# Patient Record
Sex: Female | Born: 1935 | Race: White | Hispanic: No | Smoking: Never smoker
Health system: Southern US, Community
[De-identification: ages and names within clinical notes are randomized; demographics above are authoritative.]

## PROBLEM LIST (undated history)

## (undated) DIAGNOSIS — Z5189 Encounter for other specified aftercare: Secondary | ICD-10-CM

## (undated) DIAGNOSIS — R5383 Other fatigue: Secondary | ICD-10-CM

## (undated) DIAGNOSIS — N390 Urinary tract infection, site not specified: Secondary | ICD-10-CM

## (undated) DIAGNOSIS — H9190 Unspecified hearing loss, unspecified ear: Secondary | ICD-10-CM

## (undated) DIAGNOSIS — S6292XA Unspecified fracture of left wrist and hand, initial encounter for closed fracture: Secondary | ICD-10-CM

## (undated) DIAGNOSIS — IMO0002 Reserved for concepts with insufficient information to code with codable children: Secondary | ICD-10-CM

## (undated) DIAGNOSIS — R42 Dizziness and giddiness: Secondary | ICD-10-CM

## (undated) DIAGNOSIS — R51 Headache: Secondary | ICD-10-CM

## (undated) DIAGNOSIS — K8020 Calculus of gallbladder without cholecystitis without obstruction: Secondary | ICD-10-CM

## (undated) DIAGNOSIS — I10 Essential (primary) hypertension: Secondary | ICD-10-CM

## (undated) DIAGNOSIS — M549 Dorsalgia, unspecified: Secondary | ICD-10-CM

## (undated) DIAGNOSIS — I341 Nonrheumatic mitral (valve) prolapse: Secondary | ICD-10-CM

## (undated) DIAGNOSIS — I4891 Unspecified atrial fibrillation: Secondary | ICD-10-CM

## (undated) DIAGNOSIS — I639 Cerebral infarction, unspecified: Secondary | ICD-10-CM

## (undated) DIAGNOSIS — Z973 Presence of spectacles and contact lenses: Secondary | ICD-10-CM

## (undated) DIAGNOSIS — K219 Gastro-esophageal reflux disease without esophagitis: Secondary | ICD-10-CM

## (undated) DIAGNOSIS — M1990 Unspecified osteoarthritis, unspecified site: Secondary | ICD-10-CM

## (undated) DIAGNOSIS — K529 Noninfective gastroenteritis and colitis, unspecified: Secondary | ICD-10-CM

## (undated) DIAGNOSIS — G8929 Other chronic pain: Secondary | ICD-10-CM

## (undated) DIAGNOSIS — Z923 Personal history of irradiation: Secondary | ICD-10-CM

## (undated) DIAGNOSIS — E785 Hyperlipidemia, unspecified: Secondary | ICD-10-CM

## (undated) DIAGNOSIS — D649 Anemia, unspecified: Secondary | ICD-10-CM

## (undated) DIAGNOSIS — K1231 Oral mucositis (ulcerative) due to antineoplastic therapy: Secondary | ICD-10-CM

## (undated) DIAGNOSIS — J189 Pneumonia, unspecified organism: Secondary | ICD-10-CM

## (undated) DIAGNOSIS — I739 Peripheral vascular disease, unspecified: Secondary | ICD-10-CM

## (undated) DIAGNOSIS — IMO0001 Reserved for inherently not codable concepts without codable children: Secondary | ICD-10-CM

## (undated) DIAGNOSIS — C50919 Malignant neoplasm of unspecified site of unspecified female breast: Secondary | ICD-10-CM

## (undated) DIAGNOSIS — C499 Malignant neoplasm of connective and soft tissue, unspecified: Secondary | ICD-10-CM

## (undated) HISTORY — DX: Unspecified atrial fibrillation: I48.91

## (undated) HISTORY — DX: Unspecified osteoarthritis, unspecified site: M19.90

## (undated) HISTORY — DX: Noninfective gastroenteritis and colitis, unspecified: K52.9

## (undated) HISTORY — DX: Essential (primary) hypertension: I10

## (undated) HISTORY — DX: Calculus of gallbladder without cholecystitis without obstruction: K80.20

## (undated) HISTORY — PX: SQUAMOUS CELL CARCINOMA EXCISION: SHX2433

## (undated) HISTORY — DX: Nonrheumatic mitral (valve) prolapse: I34.1

## (undated) HISTORY — DX: Oral mucositis (ulcerative) due to antineoplastic therapy: K12.31

## (undated) HISTORY — DX: Personal history of irradiation: Z92.3

## (undated) HISTORY — PX: EYE SURGERY: SHX253

## (undated) HISTORY — DX: Hyperlipidemia, unspecified: E78.5

## (undated) HISTORY — DX: Other fatigue: R53.83

## (undated) HISTORY — PX: KYPHOSIS SURGERY: SHX114

---

## 1994-04-08 DIAGNOSIS — C50919 Malignant neoplasm of unspecified site of unspecified female breast: Secondary | ICD-10-CM

## 1994-04-08 HISTORY — DX: Malignant neoplasm of unspecified site of unspecified female breast: C50.919

## 1994-04-08 HISTORY — PX: MASTECTOMY: SHX3

## 1997-04-08 HISTORY — PX: ABDOMINAL HYSTERECTOMY: SHX81

## 1997-04-08 HISTORY — PX: BOWEL RESECTION: SHX1257

## 1997-06-14 ENCOUNTER — Inpatient Hospital Stay (HOSPITAL_COMMUNITY): Admission: AD | Admit: 1997-06-14 | Discharge: 1997-07-07 | Payer: Self-pay | Admitting: Obstetrics and Gynecology

## 1998-06-13 ENCOUNTER — Other Ambulatory Visit: Admission: RE | Admit: 1998-06-13 | Discharge: 1998-06-13 | Payer: Self-pay | Admitting: Obstetrics and Gynecology

## 1999-07-12 ENCOUNTER — Emergency Department (HOSPITAL_COMMUNITY): Admission: EM | Admit: 1999-07-12 | Discharge: 1999-07-12 | Payer: Self-pay

## 1999-07-19 ENCOUNTER — Other Ambulatory Visit: Admission: RE | Admit: 1999-07-19 | Discharge: 1999-07-19 | Payer: Self-pay | Admitting: Obstetrics and Gynecology

## 1999-09-20 ENCOUNTER — Encounter: Admission: RE | Admit: 1999-09-20 | Discharge: 1999-09-20 | Payer: Self-pay | Admitting: General Surgery

## 1999-09-20 ENCOUNTER — Encounter: Payer: Self-pay | Admitting: General Surgery

## 1999-11-07 ENCOUNTER — Encounter: Admission: RE | Admit: 1999-11-07 | Discharge: 1999-11-07 | Payer: Self-pay | Admitting: Orthopedic Surgery

## 1999-11-07 ENCOUNTER — Encounter: Payer: Self-pay | Admitting: Orthopedic Surgery

## 1999-11-07 ENCOUNTER — Ambulatory Visit (HOSPITAL_BASED_OUTPATIENT_CLINIC_OR_DEPARTMENT_OTHER): Admission: RE | Admit: 1999-11-07 | Discharge: 1999-11-07 | Payer: Self-pay | Admitting: Orthopedic Surgery

## 1999-11-14 ENCOUNTER — Encounter: Admission: RE | Admit: 1999-11-14 | Discharge: 1999-12-14 | Payer: Self-pay | Admitting: Orthopedic Surgery

## 1999-12-29 ENCOUNTER — Observation Stay (HOSPITAL_COMMUNITY): Admission: EM | Admit: 1999-12-29 | Discharge: 1999-12-30 | Payer: Self-pay | Admitting: Emergency Medicine

## 1999-12-29 ENCOUNTER — Encounter: Payer: Self-pay | Admitting: Family Medicine

## 2000-06-02 ENCOUNTER — Encounter: Payer: Self-pay | Admitting: Family Medicine

## 2000-06-02 ENCOUNTER — Encounter: Admission: RE | Admit: 2000-06-02 | Discharge: 2000-06-02 | Payer: Self-pay | Admitting: Family Medicine

## 2000-08-13 ENCOUNTER — Other Ambulatory Visit: Admission: RE | Admit: 2000-08-13 | Discharge: 2000-08-13 | Payer: Self-pay | Admitting: Obstetrics and Gynecology

## 2000-09-10 ENCOUNTER — Ambulatory Visit (HOSPITAL_BASED_OUTPATIENT_CLINIC_OR_DEPARTMENT_OTHER): Admission: RE | Admit: 2000-09-10 | Discharge: 2000-09-10 | Payer: Self-pay | Admitting: Orthopedic Surgery

## 2000-09-22 ENCOUNTER — Encounter: Admission: RE | Admit: 2000-09-22 | Discharge: 2000-09-22 | Payer: Self-pay | Admitting: General Surgery

## 2000-09-22 ENCOUNTER — Encounter: Payer: Self-pay | Admitting: General Surgery

## 2001-04-23 ENCOUNTER — Encounter: Admission: RE | Admit: 2001-04-23 | Discharge: 2001-04-23 | Payer: Self-pay | Admitting: Family Medicine

## 2001-04-23 ENCOUNTER — Encounter: Payer: Self-pay | Admitting: Family Medicine

## 2001-09-03 ENCOUNTER — Encounter: Payer: Self-pay | Admitting: Obstetrics and Gynecology

## 2001-09-03 ENCOUNTER — Encounter (INDEPENDENT_AMBULATORY_CARE_PROVIDER_SITE_OTHER): Payer: Self-pay | Admitting: *Deleted

## 2001-09-03 ENCOUNTER — Ambulatory Visit (HOSPITAL_COMMUNITY): Admission: RE | Admit: 2001-09-03 | Discharge: 2001-09-03 | Payer: Self-pay | Admitting: Obstetrics and Gynecology

## 2001-09-04 ENCOUNTER — Inpatient Hospital Stay (HOSPITAL_COMMUNITY): Admission: EM | Admit: 2001-09-04 | Discharge: 2001-09-08 | Payer: Self-pay | Admitting: Emergency Medicine

## 2001-09-04 ENCOUNTER — Encounter: Payer: Self-pay | Admitting: Emergency Medicine

## 2001-09-05 ENCOUNTER — Encounter: Payer: Self-pay | Admitting: General Surgery

## 2001-09-07 ENCOUNTER — Encounter: Payer: Self-pay | Admitting: General Surgery

## 2001-09-22 ENCOUNTER — Encounter: Payer: Self-pay | Admitting: Family Medicine

## 2001-09-22 ENCOUNTER — Encounter: Admission: RE | Admit: 2001-09-22 | Discharge: 2001-09-22 | Payer: Self-pay | Admitting: Family Medicine

## 2002-08-10 ENCOUNTER — Other Ambulatory Visit: Admission: RE | Admit: 2002-08-10 | Discharge: 2002-08-10 | Payer: Self-pay | Admitting: Obstetrics and Gynecology

## 2002-09-27 ENCOUNTER — Encounter: Admission: RE | Admit: 2002-09-27 | Discharge: 2002-09-27 | Payer: Self-pay | Admitting: Family Medicine

## 2002-09-27 ENCOUNTER — Encounter: Payer: Self-pay | Admitting: Family Medicine

## 2002-12-31 ENCOUNTER — Encounter: Admission: RE | Admit: 2002-12-31 | Discharge: 2003-03-31 | Payer: Self-pay | Admitting: Orthopedic Surgery

## 2003-04-09 HISTORY — PX: CHOLECYSTECTOMY: SHX55

## 2003-04-20 ENCOUNTER — Ambulatory Visit (HOSPITAL_COMMUNITY): Admission: RE | Admit: 2003-04-20 | Discharge: 2003-04-20 | Payer: Self-pay | Admitting: *Deleted

## 2003-04-20 ENCOUNTER — Encounter (INDEPENDENT_AMBULATORY_CARE_PROVIDER_SITE_OTHER): Payer: Self-pay | Admitting: *Deleted

## 2003-09-28 ENCOUNTER — Encounter: Admission: RE | Admit: 2003-09-28 | Discharge: 2003-09-28 | Payer: Self-pay | Admitting: General Surgery

## 2004-09-27 ENCOUNTER — Inpatient Hospital Stay (HOSPITAL_COMMUNITY): Admission: EM | Admit: 2004-09-27 | Discharge: 2004-09-28 | Payer: Self-pay | Admitting: Emergency Medicine

## 2004-10-04 ENCOUNTER — Encounter: Admission: RE | Admit: 2004-10-04 | Discharge: 2004-10-04 | Payer: Self-pay | Admitting: General Surgery

## 2005-09-12 ENCOUNTER — Emergency Department (HOSPITAL_COMMUNITY): Admission: EM | Admit: 2005-09-12 | Discharge: 2005-09-12 | Payer: Self-pay | Admitting: *Deleted

## 2005-10-11 ENCOUNTER — Encounter: Admission: RE | Admit: 2005-10-11 | Discharge: 2005-10-11 | Payer: Self-pay | Admitting: General Surgery

## 2006-03-19 ENCOUNTER — Inpatient Hospital Stay (HOSPITAL_COMMUNITY): Admission: EM | Admit: 2006-03-19 | Discharge: 2006-03-21 | Payer: Self-pay | Admitting: Emergency Medicine

## 2006-07-18 ENCOUNTER — Inpatient Hospital Stay (HOSPITAL_COMMUNITY): Admission: EM | Admit: 2006-07-18 | Discharge: 2006-07-22 | Payer: Self-pay | Admitting: Emergency Medicine

## 2006-08-13 ENCOUNTER — Ambulatory Visit (HOSPITAL_COMMUNITY): Admission: RE | Admit: 2006-08-13 | Discharge: 2006-08-13 | Payer: Self-pay | Admitting: Interventional Cardiology

## 2006-10-14 ENCOUNTER — Encounter: Admission: RE | Admit: 2006-10-14 | Discharge: 2006-10-14 | Payer: Self-pay | Admitting: Family Medicine

## 2006-10-17 ENCOUNTER — Encounter: Admission: RE | Admit: 2006-10-17 | Discharge: 2006-10-17 | Payer: Self-pay | Admitting: Family Medicine

## 2007-06-30 ENCOUNTER — Encounter: Admission: RE | Admit: 2007-06-30 | Discharge: 2007-06-30 | Payer: Self-pay | Admitting: Family Medicine

## 2007-07-01 ENCOUNTER — Inpatient Hospital Stay (HOSPITAL_COMMUNITY): Admission: AD | Admit: 2007-07-01 | Discharge: 2007-07-02 | Payer: Self-pay | Admitting: Gastroenterology

## 2007-10-05 ENCOUNTER — Encounter: Admission: RE | Admit: 2007-10-05 | Discharge: 2007-10-05 | Payer: Self-pay | Admitting: Family Medicine

## 2007-10-19 ENCOUNTER — Encounter: Admission: RE | Admit: 2007-10-19 | Discharge: 2007-10-19 | Payer: Self-pay | Admitting: Family Medicine

## 2008-01-06 ENCOUNTER — Emergency Department (HOSPITAL_COMMUNITY): Admission: EM | Admit: 2008-01-06 | Discharge: 2008-01-06 | Payer: Self-pay | Admitting: Emergency Medicine

## 2008-06-02 ENCOUNTER — Encounter: Admission: RE | Admit: 2008-06-02 | Discharge: 2008-06-02 | Payer: Self-pay | Admitting: Family Medicine

## 2008-08-04 ENCOUNTER — Encounter (INDEPENDENT_AMBULATORY_CARE_PROVIDER_SITE_OTHER): Payer: Self-pay | Admitting: General Surgery

## 2008-08-04 ENCOUNTER — Ambulatory Visit (HOSPITAL_BASED_OUTPATIENT_CLINIC_OR_DEPARTMENT_OTHER): Admission: RE | Admit: 2008-08-04 | Discharge: 2008-08-04 | Payer: Self-pay | Admitting: General Surgery

## 2008-10-02 ENCOUNTER — Inpatient Hospital Stay (HOSPITAL_COMMUNITY): Admission: EM | Admit: 2008-10-02 | Discharge: 2008-10-05 | Payer: Self-pay | Admitting: Emergency Medicine

## 2008-10-19 ENCOUNTER — Encounter: Admission: RE | Admit: 2008-10-19 | Discharge: 2008-10-19 | Payer: Self-pay | Admitting: Family Medicine

## 2009-01-02 ENCOUNTER — Ambulatory Visit (HOSPITAL_BASED_OUTPATIENT_CLINIC_OR_DEPARTMENT_OTHER): Admission: RE | Admit: 2009-01-02 | Discharge: 2009-01-02 | Payer: Self-pay | Admitting: General Surgery

## 2009-01-02 ENCOUNTER — Encounter (INDEPENDENT_AMBULATORY_CARE_PROVIDER_SITE_OTHER): Payer: Self-pay | Admitting: General Surgery

## 2009-01-20 ENCOUNTER — Ambulatory Visit: Payer: Self-pay | Admitting: Oncology

## 2009-01-26 LAB — CBC WITH DIFFERENTIAL/PLATELET
Basophils Absolute: 0 10*3/uL (ref 0.0–0.1)
Eosinophils Absolute: 0.1 10*3/uL (ref 0.0–0.5)
HGB: 12.9 g/dL (ref 11.6–15.9)
LYMPH%: 15.1 % (ref 14.0–49.7)
MCV: 94.9 fL (ref 79.5–101.0)
MONO%: 6.4 % (ref 0.0–14.0)
NEUT#: 6.4 10*3/uL (ref 1.5–6.5)
Platelets: 257 10*3/uL (ref 145–400)

## 2009-01-26 LAB — COMPREHENSIVE METABOLIC PANEL
Albumin: 4.1 g/dL (ref 3.5–5.2)
Alkaline Phosphatase: 65 U/L (ref 39–117)
BUN: 24 mg/dL — ABNORMAL HIGH (ref 6–23)
CO2: 25 mEq/L (ref 19–32)
Glucose, Bld: 78 mg/dL (ref 70–99)
Potassium: 4.2 mEq/L (ref 3.5–5.3)
Total Bilirubin: 0.4 mg/dL (ref 0.3–1.2)

## 2009-02-02 ENCOUNTER — Encounter: Admission: RE | Admit: 2009-02-02 | Discharge: 2009-02-02 | Payer: Self-pay | Admitting: Oncology

## 2009-02-07 ENCOUNTER — Ambulatory Visit: Admission: RE | Admit: 2009-02-07 | Discharge: 2009-04-05 | Payer: Self-pay | Admitting: Radiation Oncology

## 2009-02-15 ENCOUNTER — Ambulatory Visit (HOSPITAL_COMMUNITY): Admission: RE | Admit: 2009-02-15 | Discharge: 2009-02-15 | Payer: Self-pay | Admitting: Oncology

## 2009-03-08 ENCOUNTER — Inpatient Hospital Stay (HOSPITAL_COMMUNITY): Admission: RE | Admit: 2009-03-08 | Discharge: 2009-03-09 | Payer: Self-pay | Admitting: General Surgery

## 2009-03-09 ENCOUNTER — Ambulatory Visit: Payer: Self-pay | Admitting: Oncology

## 2009-04-19 ENCOUNTER — Ambulatory Visit: Admission: RE | Admit: 2009-04-19 | Discharge: 2009-07-18 | Payer: Self-pay | Admitting: Radiation Oncology

## 2009-05-31 ENCOUNTER — Other Ambulatory Visit: Admission: RE | Admit: 2009-05-31 | Discharge: 2009-05-31 | Payer: Self-pay | Admitting: General Surgery

## 2009-06-14 ENCOUNTER — Ambulatory Visit (HOSPITAL_BASED_OUTPATIENT_CLINIC_OR_DEPARTMENT_OTHER): Admission: RE | Admit: 2009-06-14 | Discharge: 2009-06-14 | Payer: Self-pay | Admitting: General Surgery

## 2009-07-19 ENCOUNTER — Ambulatory Visit: Admission: RE | Admit: 2009-07-19 | Discharge: 2009-08-23 | Payer: Self-pay | Admitting: Radiation Oncology

## 2009-10-24 ENCOUNTER — Encounter: Admission: RE | Admit: 2009-10-24 | Discharge: 2009-10-24 | Payer: Self-pay | Admitting: Family Medicine

## 2009-10-25 ENCOUNTER — Ambulatory Visit: Payer: Self-pay | Admitting: Oncology

## 2009-10-27 LAB — CBC WITH DIFFERENTIAL/PLATELET
Basophils Absolute: 0 10*3/uL (ref 0.0–0.1)
EOS%: 0.6 % (ref 0.0–7.0)
Eosinophils Absolute: 0.1 10*3/uL (ref 0.0–0.5)
HCT: 38.3 % (ref 34.8–46.6)
MCH: 32.9 pg (ref 25.1–34.0)
MCHC: 33.5 g/dL (ref 31.5–36.0)
MCV: 98.3 fL (ref 79.5–101.0)
Platelets: 212 10*3/uL (ref 145–400)
RDW: 14.4 % (ref 11.2–14.5)
lymph#: 0.4 10*3/uL — ABNORMAL LOW (ref 0.9–3.3)

## 2009-11-06 ENCOUNTER — Encounter: Admission: RE | Admit: 2009-11-06 | Discharge: 2009-11-06 | Payer: Self-pay | Admitting: Oncology

## 2010-01-31 ENCOUNTER — Other Ambulatory Visit: Admission: RE | Admit: 2010-01-31 | Discharge: 2010-01-31 | Payer: Self-pay | Admitting: General Surgery

## 2010-02-15 ENCOUNTER — Ambulatory Visit (HOSPITAL_BASED_OUTPATIENT_CLINIC_OR_DEPARTMENT_OTHER): Admission: RE | Admit: 2010-02-15 | Discharge: 2010-02-15 | Payer: Self-pay | Admitting: General Surgery

## 2010-02-19 ENCOUNTER — Ambulatory Visit: Payer: Self-pay | Admitting: Oncology

## 2010-02-21 LAB — CBC WITH DIFFERENTIAL/PLATELET
Basophils Absolute: 0 10*3/uL (ref 0.0–0.1)
Eosinophils Absolute: 0.1 10*3/uL (ref 0.0–0.5)
HGB: 11.1 g/dL — ABNORMAL LOW (ref 11.6–15.9)
MCV: 98.9 fL (ref 79.5–101.0)
MONO#: 0.5 10*3/uL (ref 0.1–0.9)
MONO%: 8.1 % (ref 0.0–14.0)
NEUT#: 4.8 10*3/uL (ref 1.5–6.5)
Platelets: 182 10*3/uL (ref 145–400)
RDW: 14.3 % (ref 11.2–14.5)

## 2010-02-21 LAB — COMPREHENSIVE METABOLIC PANEL
Albumin: 3.8 g/dL (ref 3.5–5.2)
CO2: 24 mEq/L (ref 19–32)
Calcium: 9 mg/dL (ref 8.4–10.5)
Chloride: 106 mEq/L (ref 96–112)
Glucose, Bld: 79 mg/dL (ref 70–99)
Sodium: 141 mEq/L (ref 135–145)
Total Bilirubin: 0.4 mg/dL (ref 0.3–1.2)
Total Protein: 6.1 g/dL (ref 6.0–8.3)

## 2010-03-21 ENCOUNTER — Ambulatory Visit (HOSPITAL_COMMUNITY)
Admission: RE | Admit: 2010-03-21 | Discharge: 2010-03-21 | Payer: Self-pay | Source: Home / Self Care | Attending: Oncology | Admitting: Oncology

## 2010-03-26 ENCOUNTER — Ambulatory Visit
Admission: RE | Admit: 2010-03-26 | Discharge: 2010-05-08 | Payer: Self-pay | Source: Home / Self Care | Attending: Radiation Oncology | Admitting: Radiation Oncology

## 2010-04-02 ENCOUNTER — Inpatient Hospital Stay (HOSPITAL_COMMUNITY)
Admission: EM | Admit: 2010-04-02 | Discharge: 2010-04-06 | Payer: Self-pay | Source: Home / Self Care | Attending: Internal Medicine | Admitting: Internal Medicine

## 2010-04-02 ENCOUNTER — Emergency Department (HOSPITAL_COMMUNITY)
Admission: EM | Admit: 2010-04-02 | Discharge: 2010-04-02 | Disposition: A | Discharge status: Other Acute Inpatient Hospital | Payer: Self-pay | Source: Home / Self Care | Admitting: Family Medicine

## 2010-04-04 ENCOUNTER — Encounter (INDEPENDENT_AMBULATORY_CARE_PROVIDER_SITE_OTHER): Payer: Self-pay | Admitting: Neurology

## 2010-04-29 ENCOUNTER — Encounter: Payer: Self-pay | Admitting: Family Medicine

## 2010-05-04 ENCOUNTER — Ambulatory Visit: Payer: Self-pay | Admitting: Oncology

## 2010-05-09 ENCOUNTER — Ambulatory Visit: Payer: Medicare Other | Attending: Radiation Oncology | Admitting: Radiation Oncology

## 2010-05-09 DIAGNOSIS — C496 Malignant neoplasm of connective and soft tissue of trunk, unspecified: Secondary | ICD-10-CM | POA: Insufficient documentation

## 2010-05-09 DIAGNOSIS — Z510 Encounter for antineoplastic radiation therapy: Secondary | ICD-10-CM | POA: Insufficient documentation

## 2010-05-24 ENCOUNTER — Other Ambulatory Visit: Payer: Self-pay | Admitting: Oncology

## 2010-05-24 ENCOUNTER — Encounter (HOSPITAL_BASED_OUTPATIENT_CLINIC_OR_DEPARTMENT_OTHER): Payer: Medicare Other | Admitting: Oncology

## 2010-05-24 DIAGNOSIS — C496 Malignant neoplasm of connective and soft tissue of trunk, unspecified: Secondary | ICD-10-CM

## 2010-05-24 DIAGNOSIS — C499 Malignant neoplasm of connective and soft tissue, unspecified: Secondary | ICD-10-CM

## 2010-05-24 LAB — CBC WITH DIFFERENTIAL/PLATELET
BASO%: 0.9 % (ref 0.0–2.0)
EOS%: 1 % (ref 0.0–7.0)
HCT: 36.8 % (ref 34.8–46.6)
LYMPH%: 15.1 % (ref 14.0–49.7)
MCH: 31.9 pg (ref 25.1–34.0)
MCHC: 33.3 g/dL (ref 31.5–36.0)
MONO#: 0.4 10*3/uL (ref 0.1–0.9)
NEUT%: 75.1 % (ref 38.4–76.8)
Platelets: 197 10*3/uL (ref 145–400)
RBC: 3.84 10*6/uL (ref 3.70–5.45)
WBC: 4.9 10*3/uL (ref 3.9–10.3)

## 2010-05-24 LAB — COMPREHENSIVE METABOLIC PANEL
ALT: 15 U/L (ref 0–35)
AST: 22 U/L (ref 0–37)
Alkaline Phosphatase: 97 U/L (ref 39–117)
CO2: 24 mEq/L (ref 19–32)
Creatinine, Ser: 0.86 mg/dL (ref 0.40–1.20)
Sodium: 138 mEq/L (ref 135–145)
Total Bilirubin: 0.5 mg/dL (ref 0.3–1.2)
Total Protein: 6.4 g/dL (ref 6.0–8.3)

## 2010-05-25 ENCOUNTER — Other Ambulatory Visit: Payer: Self-pay | Admitting: Oncology

## 2010-05-25 DIAGNOSIS — C8172 Other classical Hodgkin lymphoma, intrathoracic lymph nodes: Secondary | ICD-10-CM

## 2010-05-28 ENCOUNTER — Other Ambulatory Visit: Payer: Self-pay | Admitting: Oncology

## 2010-05-28 DIAGNOSIS — C761 Malignant neoplasm of thorax: Secondary | ICD-10-CM

## 2010-06-05 ENCOUNTER — Ambulatory Visit: Payer: Medicare Other | Attending: General Surgery | Admitting: Physical Therapy

## 2010-06-05 DIAGNOSIS — IMO0001 Reserved for inherently not codable concepts without codable children: Secondary | ICD-10-CM | POA: Insufficient documentation

## 2010-06-05 DIAGNOSIS — R269 Unspecified abnormalities of gait and mobility: Secondary | ICD-10-CM | POA: Insufficient documentation

## 2010-06-06 ENCOUNTER — Other Ambulatory Visit (HOSPITAL_COMMUNITY): Payer: Medicare Other

## 2010-06-08 ENCOUNTER — Encounter (HOSPITAL_COMMUNITY): Payer: Self-pay

## 2010-06-08 ENCOUNTER — Ambulatory Visit (HOSPITAL_COMMUNITY)
Admission: RE | Admit: 2010-06-08 | Discharge: 2010-06-08 | Disposition: A | Discharge status: Home / Self Care | Payer: Medicare Other | Source: Ambulatory Visit | Attending: Oncology | Admitting: Oncology

## 2010-06-08 DIAGNOSIS — Z9010 Acquired absence of unspecified breast and nipple: Secondary | ICD-10-CM | POA: Insufficient documentation

## 2010-06-08 DIAGNOSIS — Z9089 Acquired absence of other organs: Secondary | ICD-10-CM | POA: Insufficient documentation

## 2010-06-08 DIAGNOSIS — K838 Other specified diseases of biliary tract: Secondary | ICD-10-CM | POA: Insufficient documentation

## 2010-06-08 DIAGNOSIS — C8172 Other classical Hodgkin lymphoma, intrathoracic lymph nodes: Secondary | ICD-10-CM

## 2010-06-08 DIAGNOSIS — C50919 Malignant neoplasm of unspecified site of unspecified female breast: Secondary | ICD-10-CM | POA: Insufficient documentation

## 2010-06-08 HISTORY — DX: Malignant neoplasm of connective and soft tissue, unspecified: C49.9

## 2010-06-08 HISTORY — DX: Malignant neoplasm of unspecified site of unspecified female breast: C50.919

## 2010-06-08 MED ORDER — IOHEXOL 300 MG/ML  SOLN
100.0000 mL | Freq: Once | INTRAMUSCULAR | Status: AC | PRN
  Administered 2010-06-08: 100 mL via INTRAVENOUS

## 2010-06-11 ENCOUNTER — Ambulatory Visit: Payer: Medicare Other | Attending: General Surgery | Admitting: Physical Therapy

## 2010-06-11 DIAGNOSIS — R269 Unspecified abnormalities of gait and mobility: Secondary | ICD-10-CM | POA: Insufficient documentation

## 2010-06-11 DIAGNOSIS — IMO0001 Reserved for inherently not codable concepts without codable children: Secondary | ICD-10-CM | POA: Insufficient documentation

## 2010-06-13 ENCOUNTER — Ambulatory Visit (HOSPITAL_COMMUNITY)
Admission: RE | Admit: 2010-06-13 | Discharge: 2010-06-13 | Disposition: A | Discharge status: Home / Self Care | Payer: Medicare Other | Source: Ambulatory Visit | Attending: Oncology | Admitting: Oncology

## 2010-06-13 DIAGNOSIS — I080 Rheumatic disorders of both mitral and aortic valves: Secondary | ICD-10-CM | POA: Insufficient documentation

## 2010-06-13 DIAGNOSIS — Z01818 Encounter for other preprocedural examination: Secondary | ICD-10-CM | POA: Insufficient documentation

## 2010-06-13 DIAGNOSIS — I079 Rheumatic tricuspid valve disease, unspecified: Secondary | ICD-10-CM | POA: Insufficient documentation

## 2010-06-15 ENCOUNTER — Ambulatory Visit: Payer: Medicare Other | Admitting: Physical Therapy

## 2010-06-18 LAB — COMPREHENSIVE METABOLIC PANEL
ALT: 23 U/L (ref 0–35)
AST: 33 U/L (ref 0–37)
Albumin: 3.7 g/dL (ref 3.5–5.2)
CO2: 25 mEq/L (ref 19–32)
Calcium: 9 mg/dL (ref 8.4–10.5)
GFR calc Af Amer: >60 mL/min (ref >60)
Sodium: 139 mEq/L (ref 135–145)
Total Protein: 6.5 g/dL (ref 6.0–8.3)

## 2010-06-18 LAB — BASIC METABOLIC PANEL
BUN: 19 mg/dL (ref 6–23)
Calcium: 8.6 mg/dL (ref 8.4–10.5)
Calcium: 9.1 mg/dL (ref 8.4–10.5)
Chloride: 106 mEq/L (ref 96–112)
Chloride: 106 mEq/L (ref 96–112)
Creatinine, Ser: 0.74 mg/dL (ref 0.4–1.2)
GFR calc Af Amer: >60 mL/min (ref >60)
GFR calc non Af Amer: >60 mL/min (ref >60)
GFR calc non Af Amer: >60 mL/min (ref >60)
Glucose, Bld: 103 mg/dL — ABNORMAL HIGH (ref 70–99)
Potassium: 3.6 mEq/L (ref 3.5–5.1)
Potassium: 4.2 mEq/L (ref 3.5–5.1)
Sodium: 140 mEq/L (ref 135–145)
Sodium: 140 mEq/L (ref 135–145)

## 2010-06-18 LAB — CK ISOENZYMES
CK-MM: 100 % (ref 95–100)
Total CK: 58 U/L (ref 7–177)

## 2010-06-18 LAB — URINALYSIS, ROUTINE W REFLEX MICROSCOPIC
Glucose, UA: NEGATIVE mg/dL
Leukocytes, UA: NEGATIVE
Specific Gravity, Urine: 1.011 (ref 1.005–1.030)
pH: 7.5 (ref 5.0–8.0)

## 2010-06-18 LAB — METHYLMALONIC ACID, SERUM: Methylmalonic Acid, Quantitative: 190 nmol/L (ref 87–318)

## 2010-06-18 LAB — CBC
HCT: 37.7 % (ref 36.0–46.0)
Hemoglobin: 11.9 g/dL — ABNORMAL LOW (ref 12.0–15.0)
Hemoglobin: 12.6 g/dL (ref 12.0–15.0)
MCH: 31.5 pg (ref 26.0–34.0)
MCV: 96.7 fL (ref 78.0–100.0)
MCV: 97.1 fL (ref 78.0–100.0)
Platelets: 224 10*3/uL (ref 150–400)
Platelets: 225 10*3/uL (ref 150–400)
Platelets: 246 10*3/uL (ref 150–400)
RBC: 3.79 MIL/uL — ABNORMAL LOW (ref 3.87–5.11)
RBC: 3.87 MIL/uL (ref 3.87–5.11)
RBC: 4.08 MIL/uL (ref 3.87–5.11)
RDW: 13.8 % (ref 11.5–15.5)
RDW: 14 % (ref 11.5–15.5)
WBC: 5.6 10*3/uL (ref 4.0–10.5)
WBC: 5.6 10*3/uL (ref 4.0–10.5)
WBC: 5.9 10*3/uL (ref 4.0–10.5)

## 2010-06-18 LAB — URINE MICROSCOPIC-ADD ON

## 2010-06-18 LAB — ANA: Anti Nuclear Antibody(ANA): NEGATIVE

## 2010-06-18 LAB — PROTIME-INR
INR: 1.64 — ABNORMAL HIGH (ref 0.00–1.49)
Prothrombin Time: 18.4 seconds — ABNORMAL HIGH (ref 11.6–15.2)
Prothrombin Time: 19.6 seconds — ABNORMAL HIGH (ref 11.6–15.2)

## 2010-06-18 LAB — LIPID PANEL
Cholesterol: 232 mg/dL — ABNORMAL HIGH (ref 0–200)
Triglycerides: 105 mg/dL (ref <150)

## 2010-06-18 LAB — C-REACTIVE PROTEIN: CRP: 0.1 mg/dL — ABNORMAL LOW (ref <0.6)

## 2010-06-18 LAB — SEDIMENTATION RATE: Sed Rate: 12 mm/hr (ref 0–22)

## 2010-06-18 LAB — TSH: TSH: 3.695 u[IU]/mL (ref 0.350–4.500)

## 2010-06-19 ENCOUNTER — Ambulatory Visit: Payer: Medicare Other | Admitting: Physical Therapy

## 2010-06-19 LAB — BASIC METABOLIC PANEL
CO2: 26 mEq/L (ref 19–32)
Chloride: 105 mEq/L (ref 96–112)
GFR calc non Af Amer: >60 mL/min (ref >60)
Glucose, Bld: 83 mg/dL (ref 70–99)
Potassium: 3.9 mEq/L (ref 3.5–5.1)
Sodium: 137 mEq/L (ref 135–145)

## 2010-06-19 LAB — POCT HEMOGLOBIN-HEMACUE
Hemoglobin: 11.5 g/dL — ABNORMAL LOW (ref 12.0–15.0)
Hemoglobin: 8.4 g/dL — ABNORMAL LOW (ref 12.0–15.0)

## 2010-06-19 LAB — APTT: aPTT: 28 seconds (ref 24–37)

## 2010-06-20 ENCOUNTER — Ambulatory Visit (HOSPITAL_COMMUNITY)
Admission: RE | Admit: 2010-06-20 | Discharge: 2010-06-20 | Disposition: A | Discharge status: Home / Self Care | Payer: Medicare Other | Source: Ambulatory Visit | Attending: Oncology | Admitting: Oncology

## 2010-06-20 DIAGNOSIS — M25559 Pain in unspecified hip: Secondary | ICD-10-CM | POA: Insufficient documentation

## 2010-06-20 DIAGNOSIS — C493 Malignant neoplasm of connective and soft tissue of thorax: Secondary | ICD-10-CM | POA: Insufficient documentation

## 2010-06-20 DIAGNOSIS — R5381 Other malaise: Secondary | ICD-10-CM | POA: Insufficient documentation

## 2010-06-20 DIAGNOSIS — R5383 Other fatigue: Secondary | ICD-10-CM | POA: Insufficient documentation

## 2010-06-20 DIAGNOSIS — C761 Malignant neoplasm of thorax: Secondary | ICD-10-CM

## 2010-06-20 DIAGNOSIS — Z01812 Encounter for preprocedural laboratory examination: Secondary | ICD-10-CM | POA: Insufficient documentation

## 2010-06-20 DIAGNOSIS — I4891 Unspecified atrial fibrillation: Secondary | ICD-10-CM | POA: Insufficient documentation

## 2010-06-20 LAB — CBC
MCH: 31 pg (ref 26.0–34.0)
Platelets: 197 10*3/uL (ref 150–400)
RBC: 3.9 MIL/uL (ref 3.87–5.11)
WBC: 5.7 10*3/uL (ref 4.0–10.5)

## 2010-06-22 ENCOUNTER — Encounter: Payer: Medicare Other | Admitting: Physical Therapy

## 2010-06-26 ENCOUNTER — Inpatient Hospital Stay (HOSPITAL_COMMUNITY)
Admission: RE | Admit: 2010-06-26 | Discharge: 2010-07-03 | DRG: 847 | Disposition: A | Discharge status: Home / Self Care | Payer: Medicare Other | Source: Ambulatory Visit | Attending: Oncology | Admitting: Oncology

## 2010-06-26 ENCOUNTER — Encounter: Payer: Medicare Other | Admitting: Physical Therapy

## 2010-06-26 ENCOUNTER — Encounter: Payer: Medicare Other | Admitting: Oncology

## 2010-06-26 DIAGNOSIS — I4891 Unspecified atrial fibrillation: Secondary | ICD-10-CM | POA: Diagnosis present

## 2010-06-26 DIAGNOSIS — G4700 Insomnia, unspecified: Secondary | ICD-10-CM | POA: Diagnosis not present

## 2010-06-26 DIAGNOSIS — M810 Age-related osteoporosis without current pathological fracture: Secondary | ICD-10-CM | POA: Diagnosis present

## 2010-06-26 DIAGNOSIS — C499 Malignant neoplasm of connective and soft tissue, unspecified: Secondary | ICD-10-CM

## 2010-06-26 DIAGNOSIS — Z853 Personal history of malignant neoplasm of breast: Secondary | ICD-10-CM

## 2010-06-26 DIAGNOSIS — C493 Malignant neoplasm of connective and soft tissue of thorax: Secondary | ICD-10-CM | POA: Diagnosis present

## 2010-06-26 DIAGNOSIS — Z8673 Personal history of transient ischemic attack (TIA), and cerebral infarction without residual deficits: Secondary | ICD-10-CM

## 2010-06-26 DIAGNOSIS — H8110 Benign paroxysmal vertigo, unspecified ear: Secondary | ICD-10-CM | POA: Diagnosis present

## 2010-06-26 DIAGNOSIS — R112 Nausea with vomiting, unspecified: Secondary | ICD-10-CM | POA: Diagnosis not present

## 2010-06-26 DIAGNOSIS — T451X5A Adverse effect of antineoplastic and immunosuppressive drugs, initial encounter: Secondary | ICD-10-CM | POA: Diagnosis not present

## 2010-06-26 DIAGNOSIS — R197 Diarrhea, unspecified: Secondary | ICD-10-CM | POA: Diagnosis not present

## 2010-06-26 DIAGNOSIS — E441 Mild protein-calorie malnutrition: Secondary | ICD-10-CM | POA: Diagnosis present

## 2010-06-26 DIAGNOSIS — Z5111 Encounter for antineoplastic chemotherapy: Principal | ICD-10-CM

## 2010-06-26 DIAGNOSIS — I10 Essential (primary) hypertension: Secondary | ICD-10-CM | POA: Diagnosis present

## 2010-06-26 DIAGNOSIS — D630 Anemia in neoplastic disease: Secondary | ICD-10-CM | POA: Diagnosis present

## 2010-06-26 LAB — COMPREHENSIVE METABOLIC PANEL
ALT: 17 U/L (ref 0–35)
AST: 26 U/L (ref 0–37)
Albumin: 3.3 g/dL — ABNORMAL LOW (ref 3.5–5.2)
Alkaline Phosphatase: 78 U/L (ref 39–117)
BUN: 18 mg/dL (ref 6–23)
Chloride: 106 mEq/L (ref 96–112)
GFR calc Af Amer: >60 mL/min (ref >60)
Potassium: 3.8 mEq/L (ref 3.5–5.1)
Total Bilirubin: 0.6 mg/dL (ref 0.3–1.2)

## 2010-06-26 LAB — CBC
MCV: 99.2 fL (ref 78.0–100.0)
Platelets: 196 10*3/uL (ref 150–400)
RBC: 3.58 MIL/uL — ABNORMAL LOW (ref 3.87–5.11)
WBC: 5.2 10*3/uL (ref 4.0–10.5)

## 2010-06-26 LAB — DIFFERENTIAL
Eosinophils Absolute: 0.1 10*3/uL (ref 0.0–0.7)
Lymphs Abs: 0.9 10*3/uL (ref 0.7–4.0)
Neutrophils Relative %: 74 % (ref 43–77)

## 2010-06-27 ENCOUNTER — Ambulatory Visit: Payer: Medicare Other | Admitting: Radiation Oncology

## 2010-06-27 LAB — CBC
MCH: 30.4 pg (ref 26.0–34.0)
MCHC: 30.9 g/dL (ref 30.0–36.0)
Platelets: 191 10*3/uL (ref 150–400)

## 2010-06-27 LAB — COMPREHENSIVE METABOLIC PANEL
AST: 24 U/L (ref 0–37)
Albumin: 3 g/dL — ABNORMAL LOW (ref 3.5–5.2)
Calcium: 8.4 mg/dL (ref 8.4–10.5)
Creatinine, Ser: 0.77 mg/dL (ref 0.4–1.2)
GFR calc Af Amer: >60 mL/min (ref >60)
Total Bilirubin: 0.5 mg/dL (ref 0.3–1.2)

## 2010-06-27 LAB — MAGNESIUM: Magnesium: 2 mg/dL (ref 1.5–2.5)

## 2010-06-28 ENCOUNTER — Encounter: Payer: Medicare Other | Admitting: Physical Therapy

## 2010-06-29 LAB — CBC
Hemoglobin: 10.7 g/dL — ABNORMAL LOW (ref 12.0–15.0)
MCH: 30.7 pg (ref 26.0–34.0)
MCHC: 31.4 g/dL (ref 30.0–36.0)
RDW: 14.4 % (ref 11.5–15.5)

## 2010-06-29 LAB — BASIC METABOLIC PANEL
CO2: 28 mEq/L (ref 19–32)
CO2: 27 mEq/L (ref 19–32)
Chloride: 108 mEq/L (ref 96–112)
Chloride: 103 mEq/L (ref 96–112)
Creatinine, Ser: 0.64 mg/dL (ref 0.4–1.2)
Creatinine, Ser: 0.98 mg/dL (ref 0.4–1.2)
GFR calc Af Amer: >60 mL/min (ref >60)
GFR calc Af Amer: >60 mL/min (ref >60)
Glucose, Bld: 83 mg/dL (ref 70–99)
Glucose, Bld: 88 mg/dL (ref 70–99)

## 2010-06-29 LAB — PROTIME-INR: INR: 1.06 (ref 0.00–1.49)

## 2010-06-29 LAB — APTT: aPTT: 33 seconds (ref 24–37)

## 2010-06-30 DIAGNOSIS — C499 Malignant neoplasm of connective and soft tissue, unspecified: Secondary | ICD-10-CM

## 2010-07-01 DIAGNOSIS — C499 Malignant neoplasm of connective and soft tissue, unspecified: Secondary | ICD-10-CM

## 2010-07-01 LAB — BASIC METABOLIC PANEL
CO2: 28 mEq/L (ref 19–32)
Calcium: 8.4 mg/dL (ref 8.4–10.5)
Creatinine, Ser: 0.62 mg/dL (ref 0.4–1.2)
GFR calc Af Amer: >60 mL/min (ref >60)
GFR calc non Af Amer: >60 mL/min (ref >60)
Sodium: 136 mEq/L (ref 135–145)

## 2010-07-02 LAB — CLOSTRIDIUM DIFFICILE BY PCR: Toxigenic C. Difficile by PCR: NEGATIVE

## 2010-07-02 LAB — MAGNESIUM: Magnesium: 1.9 mg/dL (ref 1.5–2.5)

## 2010-07-02 LAB — BASIC METABOLIC PANEL
Calcium: 8.5 mg/dL (ref 8.4–10.5)
Creatinine, Ser: 0.69 mg/dL (ref 0.4–1.2)
GFR calc Af Amer: >60 mL/min (ref >60)

## 2010-07-03 ENCOUNTER — Ambulatory Visit: Payer: Medicare Other | Admitting: Radiation Oncology

## 2010-07-03 ENCOUNTER — Encounter: Payer: Medicare Other | Admitting: Physical Therapy

## 2010-07-03 DIAGNOSIS — C496 Malignant neoplasm of connective and soft tissue of trunk, unspecified: Secondary | ICD-10-CM

## 2010-07-05 ENCOUNTER — Encounter: Payer: Medicare Other | Admitting: Physical Therapy

## 2010-07-05 NOTE — H&P (Signed)
Veronica Ellison, Veronica Ellison                ACCOUNT NO.:  [id number]  MEDICAL RECORD NO.:  [id number]           PATIENT TYPE:  I  LOCATION:  1311                         FACILITY:  [facility]  PHYSICIAN:  Jethro Bolus, M.D.          DATE OF BIRTH:  1922/02/26  DATE OF ADMISSION:  06/26/2010 DATE OF DISCHARGE:                             HISTORY & PHYSICAL   REASON FOR ADMISSION:  Elective chemotherapy, continuous IV infusion for 7 days.  HISTORY OF PRESENT ILLNESS:  [name] is an 77 year old Caucasian woman with history of recurrent soft tissue sarcoma.  She was first diagnosed on August 04, 2008, and has had multiple recurrence.  The last recurrence, she underwent resection.  She also had adjuvant radiation therapy after the first recurrence.  She at the last recurrence underwent resection on February 16, 2010, and again similar unspecifiable spindle cell sarcoma, intermediate grade, with positive margin.  She underwent adjuvant radiation therapy again second time and has finished a month ago.  She was seen in second opinion visit with Dr. Orvilla Ellison at [facility] sarcoma program who recommended continuous IV chemotherapy for 7 days given her multiple recurrence and now the last time was with positive margins making her at high risk of recurrence.  She returns today with her daughter and a son.  She reported that she has done relatively well.  The patient reports catheter placement last week with some discomfort at Port-A-Cath site; however, no purulent discharge or erythema.  She no longer has any discomfort or pain in the right posterior chest wall site, no recurrent sarcoma.  She still has some dizziness from her vertigo; however, no fall lately.  She denies any headache, visual changes, mucositis, nausea, vomiting, or lymph node swelling.  No cough, hemoptysis, abdominal pain or swelling, melena, hematuria, hematochezia, vaginal bleeding, skin rash, or focal motor weakness.   Again, the patient has chronic bilateral hip pains predated the sarcoma.  She ambulates with a walker; however, she is able to go grocery shopping with her family going to church and driving to church yesterday.  The rest of the 14 point review of systems is negative.  PAST MEDICAL HISTORY: 1. Recurrent soft tissue sarcoma. 2. Paroxysmal atrial fibrillation:  She used to be on Coumadin;     however, this was discontinued in December and she was admitted.     She is under the care of Dr. Eldridge Ellison from [facility]. 3. Benign positional vertigo. 4. History of acute thalamic stroke in December 2011. 5. Hypertension. 6. History of breast carcinoma. 7. Osteopenia. 8. Osteoarthritis.  PAST SURGICAL HISTORY: 1. Multiple right posterior chest wall resection for sarcoma 2. Cataract removal. 3. Breast mastectomy. 4. Left hand fracture repair. 5. Hysterectomy. 6. Oophorectomy for benign ovarian complex cyst. 7. History of cholecystectomy. 8. 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  ALLERGIES: 1. AMOXICILLIN. 2. MORPHINE SULFATE. 3. MEPERIDINE. 4. TETRACYCLINE. 5. SULFA. 6. NITROFURANTOIN.  CURRENT OUTPATIENT MEDICATIONS: 1. Alendronate 70 mg p.o. every week. 2. Amiodarone 200 mg p.o. daily. 3. Aspirin 325 mg p.o. daily.  4. Calcium carbonate 1 tablet p.o. b.i.d. 5. Diltiazem XT 120 mg p.o. daily. 6. Vicodin 5/325 mg half a tablet daily p.r.n. pain. 7. Multivitamin 1 tablet p.o. daily. 8. Omeprazole 20 mg p.o. daily. 9. Zofran p.r.n. vomiting.  SOCIAL HISTORY:  She denies history of smoking, alcohol, or drug use. The patient lives with her husband.  Her children live within half-an- hour distance.  She is retired now.   FAMILY HISTORY:  Father deceased from heart disease as well as her mother.  She had 5 brothers and 1 sister, all of whom have deceased. One brother had metastatic prostate cancer and 3 others had coronary artery disease.   There was no family member with sarcoma.  PHYSICAL EXAM:  VITAL SIGNS:  Temperature 97.6, heart rate 63, respiratory rate 16, blood pressure 146/67, weight of 49 kg, height of 62 inches, O2 sat 96% on room air.  ECOG performance is 1. GENERAL:  This is a thin, pale woman, in no acute distress. HEENT:  There was no scleral icterus.  There was no oropharyngeal lesion. NECK:  Supple and the exam was negative for cervical, supraclavicular, or axillary adenopathy. LUNGS:  Clear bilaterally without wheezing or crackles. CARDIOVASCULAR:  Regular rate and rhythm, S1 and S2 without murmur, rub or gallop. ABDOMEN:  Soft, flat, nontender, nondistended without organomegaly. There was no pedal edema. NEURO:  Nonfocal.  Lab is pending.  ASSESSMENT AND PLAN: 1. Recurrent sarcoma.  Discussed with Veronica Ellison and her family     members that she has had recurrence soft tissue sarcoma and the     last one was with positive margins.  She was evaluated by Dr. Orvilla Ellison who recommended adjuvant chemotherapy to decrease the risk     of recurrence and metastatic disease.  Discussed that in patients     with lower extremity high-grade sarcoma, adjuvant chemotherapy,     Adriamycin, and ifosfamide have shown to have approximately 30%     chance of decrease the risk of recurrence.  However, given her     extreme age, ifosfamide is not recommended.  Multiple trials have     shown continuous infusions of Adriamycin and Cytoxan may be used in     adjuvant sarcoma and have some benefit as well.  I discussed the     side effects of chemotherapy which include but not limited to     alopecia, nausea, vomiting, mucositis, thrombocytopenia, bleeding,     infection secondary to leukemia and lymphoma and recall reactions     from therapy recall and congestive heart failure.  She had an     echocardiogram performed on June 13, 2010, which showed normal EF.     After the informed consent, she wished to proceed with  chemotherapy     to decrease the risk of sarcoma recurrence metastatic disease     despite the possible side effects.  She will get the Emend 150 mg     IV x1, dexamethasone 60 mg IV x1, Aloxi  0.25 mg p.o. x1 10 minutes     before chemo on day #1.  Cytoxan 700 mg/sq m, currently at [place] and     80 mg percutaneous infusion over 7 days, and doxorubicin at 70     mg/sq m over 7 days with dose of 98 mg for 7 days continuous IV     infusion. 2. History of atrial fibrillation.  She is on diltiazem 120 mg p.o.  daily and amiodarone 200 mg p.o. daily per cardiologist.  She is     not on Coumadin, I assume from history of vertigo and concern for     recurrent form of  hemorrhage.  I will defer to her cardiologist to     assess the need for chronic anticoagulation therapy. 3. Osteoporosis, on alendronate 70 mg p.o. every week and calcium     twice a day. 4. History of thalamic stroke.  She is on aspirin per neurologist. 5. Gastroesophageal reflux disease.  She is on omeprazole per PCP.  CODE STATUS:  Full code.  Prophylaxis for DVT, Lovenox 40 mg subcu daily and she is on omeprazole for active ulcer prophylaxis.     Jethro Bolus, M.D.     HH/MEDQ  D:  06/26/2010  T:  06/26/2010  Job:  [id number]  cc:   Corky Crafts, MD Fax: [phone]  Gabrielle Dare. Janee Morn, M.D. [facility] [address] [place], [place] [address]  Maryln Gottron, M.D. Fax: [phone]  Stacie Acres. Cliffton Asters, M.D. Fax: [phone]  Dr. Binnie Kand  Electronically Signed by Jethro Bolus MD on 07/05/2010 12:48:36 PM

## 2010-07-05 NOTE — Discharge Summary (Signed)
Veronica Ellison, FOLZ                ACCOUNT NO.:  [id number]  MEDICAL RECORD NO.:  [id number]           PATIENT TYPE:  I  LOCATION:  1311                         FACILITY:  [facility]  PHYSICIAN:  Jethro Bolus, M.D.          DATE OF BIRTH:  11/06/1921  DATE OF ADMISSION:  06/26/2010 DATE OF DISCHARGE:  07/03/2010                              DISCHARGE SUMMARY   DISCHARGE DIAGNOSIS:  Recurrent soft tissue sarcoma of the right posterior chest wall.  STUDIES:  None obtained.  PROCEDURE:  Continuous intravenous chemotherapy infusion between June 26, 2010, and July 03, 2010.  COMORBID CONDITIONS: 1. Osteoporosis. 2. Paroxysmal atrial fibrillation. 3. History of thalamic stroke. 4. Benign positional vertigo. 5. Hypertension. 6. History of breast cancer. 7. Osteoarthritis.  BRIEF HISTORY OF PRESENT ILLNESS:  Veronica Ellison is an 77 year old woman with good performance status ECOG of 0-1 who has had 3 recurrences of her right chest wall soft tissue sarcoma who had undergone repeated resection and aspiration therapy.  The last resection had positive margins.  She underwent chemo and radiation therapy until February 2012 and was therefore admitted for adjuvant chemotherapy as determined by second opinion, Dr. Orvilla Fus, at [facility].  HOSPITAL COURSE: 1. Recurrent soft tissue sarcoma.  She was started on continuous IV     infusions of Adriamycin and Cytoxan with grade 1-2 nausea and     vomiting.  She had Emend, dexamethasone, and Aloxi before     chemotherapy day #1; however, she still had nausea and vomiting and     therefore dexamethasone 8 mg IV q.12 h. was added on days #5, #6,     and #7 of her chemotherapy with great control of her nausea and     vomiting.  However, she developed mild insomnia.  She did not want     to take a sleeping aid without problem.  She did not have any other     side effects of chemotherapy during this hospital course. 2. Atrial  fibrillation.  She was continued on outpatient diltiazem 120     mg p.o. daily.  However, her heart rate during hospital course was     repeatedly under 60.  Therefore, diltiazem was held.  I advised her     to discuss with her cardiologist, Dr. Eldridge Dace to see whether the     diltiazem should be continued or should be switched to something     else to prevent her from becoming bradycardic. 3. Osteoporosis.  Her Fosamax was held during the hospital course     given the concern for peptic ulcer disease while taking inpatient,     would resume as an outpatient.  DISCHARGE MEDICATIONS: 1. Compazine 10 mg p.o. q.6 h. p.r.n. vomiting. 2. Zofran 4 mg under the tongue every 12 hours p.r.n. nausea and     vomiting. 3. Decadron 4 mg p.o. b.i.d. for 3 days. 4. Amiodarone 200 mg p.o. daily. 5. Aspirin 325 mg p.o. daily. 6. Calcium and vitamin D 1 tablet p.o. b.i.d. 7. Diltiazem 120 mg p.o. daily. 8. Fosamax 70  mg p.o. every week. 9. Multivitamin 1 tablet p.o. daily. 10.Omeprazole 20 mg p.o. q.a.m. 11.Systane eye drops for dry eyes daily p.r.n. 12.Ambien 5-10 mg p.o. at bedtime p.r.n. insomnia.  DISCHARGE DIET:  Regular diet.  DISCHARGE ACTIVITY:  No restrictions.  She can resume her with physical therapy outpatient as needed.  DISCHARGE CONDITION:  Stable.  DISCHARGE FOLLOWUP:  With me in clinic on July 18, 2010.     Jethro Bolus, M.D.     HH/MEDQ  D:  07/03/2010  T:  07/03/2010  Job:  [id number]  Electronically Signed by Jethro Bolus MD on 07/05/2010 12:49:05 PM

## 2010-07-08 ENCOUNTER — Emergency Department (HOSPITAL_COMMUNITY)
Admission: EM | Admit: 2010-07-08 | Discharge: 2010-07-09 | Disposition: A | Discharge status: Home / Self Care | Payer: Medicare Other | Source: Home / Self Care | Attending: Emergency Medicine | Admitting: Emergency Medicine

## 2010-07-08 ENCOUNTER — Emergency Department (HOSPITAL_COMMUNITY): Payer: Medicare Other

## 2010-07-08 DIAGNOSIS — I10 Essential (primary) hypertension: Secondary | ICD-10-CM | POA: Insufficient documentation

## 2010-07-08 DIAGNOSIS — R070 Pain in throat: Secondary | ICD-10-CM | POA: Insufficient documentation

## 2010-07-08 DIAGNOSIS — Z853 Personal history of malignant neoplasm of breast: Secondary | ICD-10-CM | POA: Insufficient documentation

## 2010-07-08 DIAGNOSIS — R509 Fever, unspecified: Secondary | ICD-10-CM | POA: Insufficient documentation

## 2010-07-08 DIAGNOSIS — Z9889 Other specified postprocedural states: Secondary | ICD-10-CM | POA: Insufficient documentation

## 2010-07-08 LAB — BASIC METABOLIC PANEL
GFR calc non Af Amer: >60 mL/min (ref >60)
Potassium: 3.9 mEq/L (ref 3.5–5.1)
Sodium: 130 mEq/L — ABNORMAL LOW (ref 135–145)

## 2010-07-08 LAB — CBC
Platelets: 102 10*3/uL — ABNORMAL LOW (ref 150–400)
RDW: 13.7 % (ref 11.5–15.5)
WBC: 1.2 10*3/uL — CL (ref 4.0–10.5)

## 2010-07-09 ENCOUNTER — Inpatient Hospital Stay (HOSPITAL_COMMUNITY)
Admission: EM | Admit: 2010-07-09 | Discharge: 2010-07-14 | DRG: 809 | Disposition: A | Discharge status: Home Health | Payer: Medicare Other | Attending: Internal Medicine | Admitting: Internal Medicine

## 2010-07-09 DIAGNOSIS — D61810 Antineoplastic chemotherapy induced pancytopenia: Secondary | ICD-10-CM | POA: Diagnosis present

## 2010-07-09 DIAGNOSIS — R079 Chest pain, unspecified: Secondary | ICD-10-CM | POA: Diagnosis not present

## 2010-07-09 DIAGNOSIS — E440 Moderate protein-calorie malnutrition: Secondary | ICD-10-CM | POA: Diagnosis present

## 2010-07-09 DIAGNOSIS — R5081 Fever presenting with conditions classified elsewhere: Secondary | ICD-10-CM | POA: Diagnosis present

## 2010-07-09 DIAGNOSIS — D702 Other drug-induced agranulocytosis: Principal | ICD-10-CM | POA: Diagnosis present

## 2010-07-09 DIAGNOSIS — E876 Hypokalemia: Secondary | ICD-10-CM | POA: Diagnosis present

## 2010-07-09 DIAGNOSIS — T451X5A Adverse effect of antineoplastic and immunosuppressive drugs, initial encounter: Secondary | ICD-10-CM | POA: Diagnosis present

## 2010-07-09 DIAGNOSIS — C493 Malignant neoplasm of connective and soft tissue of thorax: Secondary | ICD-10-CM | POA: Diagnosis present

## 2010-07-09 DIAGNOSIS — I10 Essential (primary) hypertension: Secondary | ICD-10-CM | POA: Diagnosis present

## 2010-07-09 DIAGNOSIS — Z853 Personal history of malignant neoplasm of breast: Secondary | ICD-10-CM

## 2010-07-09 DIAGNOSIS — Z8673 Personal history of transient ischemic attack (TIA), and cerebral infarction without residual deficits: Secondary | ICD-10-CM

## 2010-07-09 DIAGNOSIS — H6120 Impacted cerumen, unspecified ear: Secondary | ICD-10-CM | POA: Diagnosis present

## 2010-07-09 DIAGNOSIS — D709 Neutropenia, unspecified: Secondary | ICD-10-CM

## 2010-07-09 DIAGNOSIS — L299 Pruritus, unspecified: Secondary | ICD-10-CM | POA: Diagnosis present

## 2010-07-09 DIAGNOSIS — M810 Age-related osteoporosis without current pathological fracture: Secondary | ICD-10-CM | POA: Diagnosis present

## 2010-07-09 DIAGNOSIS — I4891 Unspecified atrial fibrillation: Secondary | ICD-10-CM | POA: Diagnosis present

## 2010-07-09 DIAGNOSIS — B370 Candidal stomatitis: Secondary | ICD-10-CM | POA: Diagnosis present

## 2010-07-10 ENCOUNTER — Emergency Department (HOSPITAL_COMMUNITY): Payer: Medicare Other

## 2010-07-10 LAB — URINALYSIS, ROUTINE W REFLEX MICROSCOPIC
Bilirubin Urine: NEGATIVE
Glucose, UA: NEGATIVE mg/dL
Ketones, ur: NEGATIVE mg/dL
Leukocytes, UA: NEGATIVE
Nitrite: NEGATIVE
Protein, ur: NEGATIVE mg/dL
Specific Gravity, Urine: 1.012 (ref 1.005–1.030)
Urobilinogen, UA: 0.2 mg/dL (ref 0.0–1.0)
pH: 6.5 (ref 5.0–8.0)

## 2010-07-10 LAB — CBC
HCT: 28.7 % — ABNORMAL LOW (ref 36.0–46.0)
Hemoglobin: 9.3 g/dL — ABNORMAL LOW (ref 12.0–15.0)
MCH: 31.3 pg (ref 26.0–34.0)
MCHC: 32.4 g/dL (ref 30.0–36.0)
MCV: 96.6 fL (ref 78.0–100.0)
Platelets: 75 K/uL — ABNORMAL LOW (ref 150–400)
RBC: 2.97 MIL/uL — ABNORMAL LOW (ref 3.87–5.11)
RDW: 13.6 % (ref 11.5–15.5)
WBC: 0.7 K/uL — CL (ref 4.0–10.5)

## 2010-07-10 LAB — COMPREHENSIVE METABOLIC PANEL WITH GFR
ALT: 26 U/L (ref 0–35)
AST: 20 U/L (ref 0–37)
Albumin: 2.5 g/dL — ABNORMAL LOW (ref 3.5–5.2)
Alkaline Phosphatase: 71 U/L (ref 39–117)
BUN: 12 mg/dL (ref 6–23)
CO2: 27 meq/L (ref 19–32)
Calcium: 8 mg/dL — ABNORMAL LOW (ref 8.4–10.5)
Chloride: 103 meq/L (ref 96–112)
Creatinine, Ser: 0.71 mg/dL (ref 0.4–1.2)
GFR calc non Af Amer: >60 mL/min
Glucose, Bld: 104 mg/dL — ABNORMAL HIGH (ref 70–99)
Potassium: 3.9 meq/L (ref 3.5–5.1)
Sodium: 134 meq/L — ABNORMAL LOW (ref 135–145)
Total Bilirubin: 0.8 mg/dL (ref 0.3–1.2)
Total Protein: 4.6 g/dL — ABNORMAL LOW (ref 6.0–8.3)

## 2010-07-10 LAB — IRON AND TIBC
Saturation Ratios: 16 % — ABNORMAL LOW (ref 20–55)
TIBC: 189 ug/dL — ABNORMAL LOW (ref 250–470)
UIBC: 159 ug/dL

## 2010-07-10 LAB — DIFFERENTIAL
Band Neutrophils: 0 % (ref 0–10)
Basophils Absolute: 0 K/uL (ref 0.0–0.1)
Basophils Relative: 0 % (ref 0–1)
Eosinophils Absolute: 0 K/uL (ref 0.0–0.7)
Eosinophils Relative: 0 % (ref 0–5)
Lymphocytes Relative: 0 % — ABNORMAL LOW (ref 12–46)
Lymphs Abs: 0 K/uL — ABNORMAL LOW (ref 0.7–4.0)
Monocytes Absolute: 0 K/uL — ABNORMAL LOW (ref 0.1–1.0)
Monocytes Relative: 0 % — ABNORMAL LOW (ref 3–12)
Neutrophils Relative %: 0 % — ABNORMAL LOW (ref 43–77)
nRBC: 0 /100{WBCs}

## 2010-07-10 LAB — COMPREHENSIVE METABOLIC PANEL
BUN: 12 mg/dL (ref 6–23)
CO2: 26 mEq/L (ref 19–32)
Calcium: 7.6 mg/dL — ABNORMAL LOW (ref 8.4–10.5)
GFR calc non Af Amer: >60 mL/min (ref >60)
Glucose, Bld: 92 mg/dL (ref 70–99)
Total Protein: 4.4 g/dL — ABNORMAL LOW (ref 6.0–8.3)

## 2010-07-10 LAB — HEMOCCULT GUIAC POC 1CARD (OFFICE): Fecal Occult Bld: POSITIVE

## 2010-07-10 LAB — TYPE AND SCREEN
ABO/RH(D): O POS
Antibody Screen: NEGATIVE

## 2010-07-10 LAB — CLOSTRIDIUM DIFFICILE BY PCR: Toxigenic C. Difficile by PCR: NEGATIVE

## 2010-07-10 LAB — PROTIME-INR
INR: 0.92 (ref 0.00–1.49)
INR: 0.98 (ref 0.00–1.49)
Prothrombin Time: 12.9 seconds (ref 11.6–15.2)

## 2010-07-10 LAB — URINE MICROSCOPIC-ADD ON

## 2010-07-10 LAB — PHOSPHORUS: Phosphorus: 2.6 mg/dL (ref 2.3–4.6)

## 2010-07-10 LAB — FOLATE: Folate: >20 ng/mL

## 2010-07-10 LAB — MAGNESIUM: Magnesium: 1.8 mg/dL (ref 1.5–2.5)

## 2010-07-11 LAB — URINE CULTURE: Culture: NO GROWTH

## 2010-07-11 LAB — CBC
HCT: 38.5 % (ref 36.0–46.0)
Hemoglobin: 8.7 g/dL — ABNORMAL LOW (ref 12.0–15.0)
Hemoglobin: 13.1 g/dL (ref 12.0–15.0)
Platelets: 71 10*3/uL — ABNORMAL LOW (ref 150–400)
Platelets: 235 10*3/uL (ref 150–400)
RBC: 2.8 MIL/uL — ABNORMAL LOW (ref 3.87–5.11)
WBC: 1.1 10*3/uL — CL (ref 4.0–10.5)
WBC: 8.8 10*3/uL (ref 4.0–10.5)

## 2010-07-11 LAB — BASIC METABOLIC PANEL
BUN: 4 mg/dL — ABNORMAL LOW (ref 6–23)
Calcium: 7.7 mg/dL — ABNORMAL LOW (ref 8.4–10.5)
Calcium: 9.5 mg/dL (ref 8.4–10.5)
Creatinine, Ser: 0.7 mg/dL (ref 0.4–1.2)
GFR calc Af Amer: >60 mL/min (ref >60)
GFR calc Af Amer: >60 mL/min (ref >60)
GFR calc non Af Amer: >60 mL/min (ref >60)
GFR calc non Af Amer: 58 mL/min — ABNORMAL LOW (ref >60)
Glucose, Bld: 112 mg/dL — ABNORMAL HIGH (ref 70–99)
Potassium: 4.5 mEq/L (ref 3.5–5.1)
Sodium: 140 mEq/L (ref 135–145)

## 2010-07-11 LAB — DIFFERENTIAL
Basophils Absolute: 0 10*3/uL (ref 0.0–0.1)
Basophils Relative: 1 % (ref 0–1)
Eosinophils Absolute: 0 10*3/uL (ref 0.0–0.7)
Lymphocytes Relative: 45 % (ref 12–46)
Monocytes Relative: 22 % — ABNORMAL HIGH (ref 3–12)
Neutrophils Relative %: 31 % — ABNORMAL LOW (ref 43–77)

## 2010-07-12 ENCOUNTER — Inpatient Hospital Stay (HOSPITAL_COMMUNITY): Payer: Medicare Other

## 2010-07-12 LAB — DIFFERENTIAL
Basophils Relative: 2 % — ABNORMAL HIGH (ref 0–1)
Eosinophils Relative: 2 % (ref 0–5)
Monocytes Absolute: 0.5 10*3/uL (ref 0.1–1.0)
Monocytes Relative: 16 % — ABNORMAL HIGH (ref 3–12)
Neutrophils Relative %: 46 % (ref 43–77)

## 2010-07-12 LAB — COMPREHENSIVE METABOLIC PANEL
ALT: 20 U/L (ref 0–35)
AST: 12 U/L (ref 0–37)
Alkaline Phosphatase: 78 U/L (ref 39–117)
CO2: 27 mEq/L (ref 19–32)
Calcium: 7.5 mg/dL — ABNORMAL LOW (ref 8.4–10.5)
Chloride: 107 mEq/L (ref 96–112)
GFR calc Af Amer: >60 mL/min (ref >60)
GFR calc non Af Amer: >60 mL/min (ref >60)
Glucose, Bld: 100 mg/dL — ABNORMAL HIGH (ref 70–99)
Potassium: 3.1 mEq/L — ABNORMAL LOW (ref 3.5–5.1)
Sodium: 139 mEq/L (ref 135–145)
Total Bilirubin: 0.6 mg/dL (ref 0.3–1.2)

## 2010-07-12 LAB — CBC
HCT: 26.5 % — ABNORMAL LOW (ref 36.0–46.0)
Hemoglobin: 8.5 g/dL — ABNORMAL LOW (ref 12.0–15.0)
MCHC: 32.1 g/dL (ref 30.0–36.0)
RBC: 2.74 MIL/uL — ABNORMAL LOW (ref 3.87–5.11)

## 2010-07-12 LAB — CARDIAC PANEL(CRET KIN+CKTOT+MB+TROPI): Total CK: 31 U/L (ref 7–177)

## 2010-07-12 LAB — MAGNESIUM: Magnesium: 1.7 mg/dL (ref 1.5–2.5)

## 2010-07-13 LAB — CBC
MCH: 31.5 pg (ref 26.0–34.0)
MCHC: 32.4 g/dL (ref 30.0–36.0)
Platelets: 116 10*3/uL — ABNORMAL LOW (ref 150–400)
RDW: 13.6 % (ref 11.5–15.5)

## 2010-07-13 LAB — CARDIAC PANEL(CRET KIN+CKTOT+MB+TROPI)
Total CK: 29 U/L (ref 7–177)
Troponin I: 0.19 ng/mL — ABNORMAL HIGH (ref 0.00–0.06)

## 2010-07-13 LAB — DIFFERENTIAL
Basophils Absolute: 0 10*3/uL (ref 0.0–0.1)
Eosinophils Absolute: 0 10*3/uL (ref 0.0–0.7)
Lymphocytes Relative: 13 % (ref 12–46)
Lymphs Abs: 1.1 10*3/uL (ref 0.7–4.0)
Monocytes Absolute: 1.3 10*3/uL — ABNORMAL HIGH (ref 0.1–1.0)
Neutro Abs: 5.7 10*3/uL (ref 1.7–7.7)

## 2010-07-13 LAB — COMPREHENSIVE METABOLIC PANEL
Alkaline Phosphatase: 75 U/L (ref 39–117)
BUN: 4 mg/dL — ABNORMAL LOW (ref 6–23)
CO2: 26 mEq/L (ref 19–32)
Chloride: 109 mEq/L (ref 96–112)
Creatinine, Ser: 0.7 mg/dL (ref 0.4–1.2)
GFR calc non Af Amer: >60 mL/min (ref >60)
Potassium: 3.3 mEq/L — ABNORMAL LOW (ref 3.5–5.1)
Total Bilirubin: 0.3 mg/dL (ref 0.3–1.2)

## 2010-07-13 LAB — BASIC METABOLIC PANEL
Calcium: 9 mg/dL (ref 8.4–10.5)
Creatinine, Ser: 0.99 mg/dL (ref 0.4–1.2)
GFR calc Af Amer: >60 mL/min (ref >60)
GFR calc non Af Amer: 53 mL/min — ABNORMAL LOW (ref >60)
Glucose, Bld: 87 mg/dL (ref 70–99)
Sodium: 139 mEq/L (ref 135–145)

## 2010-07-13 LAB — APTT: aPTT: 32 seconds (ref 24–37)

## 2010-07-13 LAB — PROTIME-INR: INR: 1.3 (ref 0.00–1.49)

## 2010-07-14 LAB — CBC
MCV: 97.5 fL (ref 78.0–100.0)
Platelets: 147 10*3/uL — ABNORMAL LOW (ref 150–400)
RBC: 2.78 MIL/uL — ABNORMAL LOW (ref 3.87–5.11)
RDW: 13.7 % (ref 11.5–15.5)
WBC: 10.2 10*3/uL (ref 4.0–10.5)

## 2010-07-14 LAB — COMPREHENSIVE METABOLIC PANEL
ALT: 18 U/L (ref 0–35)
AST: 16 U/L (ref 0–37)
Albumin: 2.3 g/dL — ABNORMAL LOW (ref 3.5–5.2)
Alkaline Phosphatase: 90 U/L (ref 39–117)
Chloride: 107 mEq/L (ref 96–112)
GFR calc Af Amer: >60 mL/min (ref >60)
Potassium: 3.9 mEq/L (ref 3.5–5.1)
Sodium: 140 mEq/L (ref 135–145)
Total Bilirubin: 0.5 mg/dL (ref 0.3–1.2)
Total Protein: 4.4 g/dL — ABNORMAL LOW (ref 6.0–8.3)

## 2010-07-14 LAB — STOOL CULTURE

## 2010-07-14 LAB — DIFFERENTIAL
Basophils Absolute: 0.1 10*3/uL (ref 0.0–0.1)
Basophils Relative: 1 % (ref 0–1)
Eosinophils Absolute: 0 10*3/uL (ref 0.0–0.7)
Lymphocytes Relative: 14 % (ref 12–46)
Monocytes Absolute: 1.6 10*3/uL — ABNORMAL HIGH (ref 0.1–1.0)
Neutrophils Relative %: 69 % (ref 43–77)

## 2010-07-15 NOTE — Discharge Summary (Signed)
Veronica Ellison, TAMM NO.:  [id number]  MEDICAL RECORD NO.:  [id number]           PATIENT TYPE:  I  LOCATION:  1418                         FACILITY:  [facility]  PHYSICIAN:  Talmage Nap, MD  DATE OF BIRTH:  1922/03/10  DATE OF ADMISSION:  07/09/2010 DATE OF DISCHARGE:  07/14/2010                        DISCHARGE SUMMARY - REFERRING   PRIMARY CARE PHYSICIAN:  [name], M.D.  ONCOLOGIST:  Jethro Bolus, M.D.  CONSULTANTS INVOLVED IN THE CASE: 1. Oncology Dr. Jethro Bolus. 2. [facility]. Marland Kitchen  DISCHARGE DIAGNOSES: 1. Neutropenic fever - resolved. 2. Chest pain, resolved, questionable angina.  The patient is to be on     nitroglycerin and Imdur. 3. Pancytopenia, most likely secondary to chemotherapy. 4. History of recurrent right chest wall sarcoma status post     chemotherapy. 5. Oral thrush. 6. Pruritus, etiology unknown, resolving on discharge. 7. Bilateral ear wax - the patient is to undergo ear syringing prior     to discharge. 8. Hypertension. 9. Atrial fibrillation. 10.Electrolyte imbalance, i.e., hypokalemia and hypomagnesemia,     corrected on discharge. 11.Osteoporosis. 12.History of CVA, presently no neuro deficit. 13.History of benign positional vertigo. 14.Osteoarthritis. 15.History of breast CA.  Patient is an 77 year old Caucasian female with history of right chest wall sarcoma as well as breast CA status post chemotherapy, was admitted to the hospital on July 09, 2010,by [facility] hospitalist with complaint of multiple mouth ulcers as well as low-grade fever.  The patient was said to have had a fever of about 102.2 at home.  There is, however, no history of vomiting, no history of chest pain or shortness of breath, no diarrhea or hematochezia and subsequently, the patient was admitted for stabilization.  Her preadmission meds include: 1. Chemotherapy. 2. Systane drops, polyethylene glycol 400, that is 0.4%, propylene     glycol 0.3%  both eyes 1 drop daily p.r.n. 3. Lomotil (Diphenoxylate/Atropine) 1 cap p.o. 4 times p.r.n. 4. Imodium (loperamide) 2 mg 1-2 caps p.o. q.8h. p.r.n. 5. Ondansetron (Zofran) 4 mg 1-2 tablets daily q.12h. p.r.n. 6. Fosamax (alendronate) 70 mg 1 p.o. every week. 7. Decadron (dexamethasone) 4 mg 1 p.o. b.i.d. 8. Compazine (prochlorperazine) 10 mg 1 p.o. q.6h. p.r.n. 9. Ambien (Zolpidem) 5 mg 0.5 to 1 tablet daily at bedtime p.r.n. 10.Aspirin enteric-coated 325 mg 1 p.o. daily. 11.Calcium carbonate/Vitamin D 600 mg/400 mg 1 p.o. b.i.d. 12.Multivitamin 1 p.o. daily. 13.Omeprazole 20 mg 1 cap p.o. 14.Amiodarone 200 mg 0.5 to 1 tablet p.o. q.a.m.  ALLERGIES: 1. Meperidine (Demerol) 2. Sulfa. 3. Tetracycline. 4. Nitrofurantoin. 5. Morphine sulfate. 6. Amoxicillin. 7. Hydrocodone. 8. Adhesive XYAB. 9. Oxycodone.  Food allergies will include nuts and chocolate.  SOCIAL HISTORY:  Negative for alcohol or tobacco use.  FAMILY HISTORY:  [place] Spaniel to be noncontributory.  REVIEW OF SYSTEMS:  Essentially documented in the initial history andphysical.  PHYSICAL EXAMINATION:  VITAL SIGNS:  At time the patient was seen by the admitting physician, vital signs:  Temperature was 100.2, blood pressure was 124/61, pulse 77, respiratory rate 18, saturating 99% on room air. HEENT:  Pupils were reactive to light and extraocular muscles are intact.  The patient was not in any respiratory distress.  She also has dry buccal mucosa and  multiple oral ulcers, i.e. thrush. NECK:  No jugular venous distention.  No carotid bruit.  No lymphadenopathy. CHEST:  Said to be clear to auscultation. HEART:  Heart sounds were S1 and S2. ABDOMEN:  Soft, nontender.  Liver, spleen tip not palpable.  Bowel sounds are positive. EXTREMITIES:  No pedal edema. NEUROLOGIC:  Nonfocal. Musculoskeletal system showed arthritic changes in the knees and in the feet.  Neuropsychiatric evaluation is unremarkable. SKIN:  Showed  decreased turgor and examination of the mouth showed whitish lesion in the mouth.  LAB DATA:  Complete blood count with no differential done on April 1 showed WBC of 1.2, hemoglobin of 10.0, hematocrit of 29.9, MCV of 92.6 with a platelet count of 102,000.  Basic metabolic panel done July 08, 2010 showed sodium of 130, potassium of 3.9, chloride of 99 with a bicarb of 24, glucose is 104, BUN is 22, creatinine 0.78.  Urinalysis unremarkable.  Urine microscopy unremarkable and a comprehensive metabolic panel done on July 10, 2010 showed sodium of 134, potassium of 3.9, chloride of 102 with a bicarb of 27, glucose is 104, BUN is 12, creatinine 0.71 and LFT normal and a repeat complete blood count with no differential done on July 10, 2010 showed WBC of 0.7, hemoglobin 9.3, hematocrit of 28.7, MCV of 96.6 with a platelet count of 75,000. Phosphorus level is 2.6, magnesium level is 1.8.  Fecal occult blood test was positive. C difficile PCR negative.  Thyroid panel, TSH 0.97, is normal.  Anemia panel showed total iron 30, total iron binding capacity is 189, percent desaturation is 16, UIBC 158 and vitamin B12 is 329.  Urine culture, no growth.  Repeat complete blood count with differential done on July 11, 2010 showed WBC of 1.1, hemoglobin of 8.7, hematocrit 27.0, MCV of 96.4 with a platelet count of 71,000, neutrophil 31% and absolute neutrophil count is 0.3 and a repeat basic metabolic panel done in July 11, 2010 showed sodium of 138, potassium of 3.5, chloride of 108, the bicarb of 26, glucose is 112, BUN is 4, creatinine 0.70.  Fecal lactoferrin level was positive, this was done in July 10, 2010. Blood culture x2 negative.  A repeat comprehensive metabolic panel done on July 12, 2010 shows sodium of 139, potassium of 3.1, chloride of 107 with a bicarb of 27, glucose is 100, BUN is 4, creatinine 0.60.  LFT normal.  Total protein 4.4, albumin is 2.2, magnesium level is 1.7. Stool culture,  unremarkable.  Three sets of cardiac markers done from April 5 to April 6 are as follows:  Troponin-I 0.23, CK-MB 3.8, repeat troponin-I 0.19, CK-MB 2.9 and the last set, troponin-I 0.22, CK-MB 3.0. A repeat magnesium level done on July 13, 2010 showed magnesium of 1.5. Comprehensive metabolic panel shows sodium of 140, potassium of 3.3, chloride of 109 with a bicarb of 26, glucose is 98, BUN is 4, creatinine 0.70.  Complete blood count with differential done on April 6 showed WBC of 8.1, hemoglobin 8.1, hematocrit of 25.0, MCV of 97.3 with a platelet count of 116,000 and complete blood count with differential done on July 14, 2010 showed WBC of 10.2, hemoglobin 8.6, hematocrit 27.1, MCV of 97.5 with a platelet count of 147,000, neutrophil count is 69% and absolute neutrophil count is 7.1. Magnesium level is 1.6 and comprehensive metabolic panel done showed sodium of 140, potassium of 3.9, chloride of 107  with bicarb of 28, glucose is 93, BUN is 5, creatinine 0.71.  Imaging studies done include chest x-ray on April 1, which showed no evidence of active cardiopulmonary disease and repeat chest x-ray was on July 10, 2010, which showed no acute cardiopulmonary abnormalities, there is however some slight bronchitic changes and a repeat chest x-ray done July 12, 2010 showed left basilar scar versus atelectasis and small pleural effusion.  HOSPITAL COURSE:  The patient was admitted to telemetry.  She was put on neutropenic precautions and started on normal saline IV to go at rate of 75 mL an hour, aspirin 81 mg p.o. daily, Zofran 4 mg IV q.4h. p.r.n. for nausea and SCDs boots for DVT prophylaxis.  She was also started on nystatin swish and swallow 15 mL t.i.d. and also with Magic Mouthwash 15 mL q.6h. p.r.n. The patient was also given fluconazole 200 mg IV stat and subsequently fluconazole 100 mg IV q.24h.  She was started on cefepime IV as well as vancomycin IV and dosing was done by  pharmacy and GI prophylaxis was with Protonix 40 IV q.24h.  Chemo-oncology was consulted, Dr. Exie Parody of [facility] and the patient was subsequently started on Neupogen 200 mcg subcutaneously daily. The patient complained of anorectal pain and subsequently was placed on Anusol suppository b.i.d. p.r.n. She was seen by me for the very first time in this admission on July 11, 2010 and during this encounter, no major changes were made in the patient's management and the initial treatment that was initiated was basically continued. On July 12, 2010, patient was found to be hypokalemic and was started on KCl 40 mEq p.o. stat and this was to be followed by KCl 20 mEq p.o. b.i.d. Over the night on July 12, 2010, the patient was said to have an unspecified episode of chest pain and subsequent cardiac markers were done, showed elevated troponin I, although CK-MB was normal.  She was given nitropaste 0.5 inch q.6h. p.r.n. for chest pain.  At that time, the patient was reevaluated by me on July 13, 2010. Chest pain had almost completely resolved and during this encounter, nitropaste was discontinued and the patient was placed on nitroglycerin 0.4 mg sublingual p.r.n. for chest pain and since the patient had borderline magnesium level, she was placed on mag oxide 400 mg p.o. b.i.d. and to continue with KCl 20 mEq p.o. b.i.d. [facility] was consulted, evaluated the patient and recommended that patient could be discharged if medically stable. Neupogen was also discontinued by me on July 13, 2010.  So far the patient has remained clinically stable.  She was seen by me today, which is July 14, 2010, complained about bilateral ear pain with no discharges and otoscopic examination showed impacted wax and the plan was for the patient to have bilateral ear syringing before being discharged. Clinically, the patient is stable.  Her vital signs; blood pressure 114/64, temperature is  98.1, pulse 76, respiratory rate 18. Throughout my encounter with the patient, I had an extensive discussion with the entire family which include the patient's daughter,spouse granddaughter and sister, and all verbalized understanding.   Plan is for the patient to be discharged home today after ear syringing. Activity as tolerated.  Cardiac prudent diet.  She was advised to follow up with her oncology schedule and to be seen by her primary care physician in 1-2 weeks.  Medication to be taken at home will include: 1. Diphenhydramine cream topically apply b.i.d. p.r.n. 2.  Imdur 30 mg 1 p.o. daily. 3. Magic Mouthwash 15 mL 3 times a day p.r.n. 4. Magnesium oxide 100 mg one p.o. b.i.d. 5. Nitroglycerin 0.4 mg sublingual p.r.n. every 5 minutes x3 doses for     chest pain. 6. Nystatin suspension 100,000 units/mL by mouth 3 times a day. 7. Ambien (Zolpidem) 5 mg half to 1 tablet p.o. daily p.r.n. at     bedtime. 8. Amiodarone 200 mg half tablet to one by mouth q.a.m. 9. Aspirin enteric-coated 325 mg one p.o. daily. 10.Calcium carbonate/vitamin D 600 mg/400 mg 1 p.o. b.i.d. 11.The patient is to continue chemotherapy regimen. 12.Compazine (prochlorperazine) 10 mg 1 p.o. q.6h. p.r.n. 13.Decadron (dexamethasone) 4 mg 1 p.o. b.i.d. 14.Fosamax (alendronate) 70 mg one p.o. every week. 15.Imodium (loperamide) 2 mg 1-2 capsule by mouth q.8h. p.r.n. 16.Lomotil (diphenoxylate/Atropine) 1 capsule p.o. 4 times p.r.n. for     diarrhea. 17.Multivitamin 1 tablet p.o. daily. 18.Omeprazole 20 mg 1 p.o. q.a.m. 19.Ondansetron (Zofran) 4 mg 1-2 tablets p.o. or sublingual q.12h.     p.r.n. 20.Systane drops, that is polyethylene glycol 400 and 0.5% propylene     glycol 0.3%, 1 drop both eyes daily p.r.n.     Talmage Nap, MD     CN/MEDQ  D:  07/14/2010  T:  07/14/2010  Job:  [id number]  cc:   [name], M.D. Fax: [phone]  Electronically Signed by Talmage Nap  on 07/15/2010 07:56:35  AM

## 2010-07-16 LAB — CBC
HCT: 33.2 % — ABNORMAL LOW (ref 36.0–46.0)
HCT: 36.6 % (ref 36.0–46.0)
Hemoglobin: 10.5 g/dL — ABNORMAL LOW (ref 12.0–15.0)
Hemoglobin: 11 g/dL — ABNORMAL LOW (ref 12.0–15.0)
MCHC: 33.1 g/dL (ref 30.0–36.0)
MCHC: 33.6 g/dL (ref 30.0–36.0)
MCHC: 34.5 g/dL (ref 30.0–36.0)
MCV: 96.6 fL (ref 78.0–100.0)
MCV: 97.6 fL (ref 78.0–100.0)
Platelets: 154 10*3/uL (ref 150–400)
Platelets: 231 10*3/uL (ref 150–400)
RBC: 3.4 MIL/uL — ABNORMAL LOW (ref 3.87–5.11)
RBC: 3.42 MIL/uL — ABNORMAL LOW (ref 3.87–5.11)
RDW: 15.3 % (ref 11.5–15.5)
WBC: 6.4 10*3/uL (ref 4.0–10.5)
WBC: 7.8 10*3/uL (ref 4.0–10.5)

## 2010-07-16 LAB — COMPREHENSIVE METABOLIC PANEL
ALT: 299 U/L — ABNORMAL HIGH (ref 0–35)
AST: 169 U/L — ABNORMAL HIGH (ref 0–37)
AST: 242 U/L — ABNORMAL HIGH (ref 0–37)
Albumin: 2.5 g/dL — ABNORMAL LOW (ref 3.5–5.2)
Albumin: 3.6 g/dL (ref 3.5–5.2)
Alkaline Phosphatase: 188 U/L — ABNORMAL HIGH (ref 39–117)
BUN: 19 mg/dL (ref 6–23)
BUN: 9 mg/dL (ref 6–23)
CO2: 26 mEq/L (ref 19–32)
Calcium: 8.1 mg/dL — ABNORMAL LOW (ref 8.4–10.5)
Calcium: 8.3 mg/dL — ABNORMAL LOW (ref 8.4–10.5)
Calcium: 9.1 mg/dL (ref 8.4–10.5)
Chloride: 106 mEq/L (ref 96–112)
Chloride: 107 mEq/L (ref 96–112)
Creatinine, Ser: 0.85 mg/dL (ref 0.4–1.2)
Creatinine, Ser: 1.03 mg/dL (ref 0.4–1.2)
GFR calc Af Amer: >60 mL/min (ref >60)
GFR calc Af Amer: >60 mL/min (ref >60)
GFR calc Af Amer: >60 mL/min (ref >60)
GFR calc non Af Amer: 51 mL/min — ABNORMAL LOW (ref >60)
GFR calc non Af Amer: >60 mL/min (ref >60)
Glucose, Bld: 103 mg/dL — ABNORMAL HIGH (ref 70–99)
Glucose, Bld: 118 mg/dL — ABNORMAL HIGH (ref 70–99)
Glucose, Bld: 79 mg/dL (ref 70–99)
Potassium: 3.3 mEq/L — ABNORMAL LOW (ref 3.5–5.1)
Sodium: 139 mEq/L (ref 135–145)
Total Bilirubin: 1.3 mg/dL — ABNORMAL HIGH (ref 0.3–1.2)
Total Bilirubin: 1.7 mg/dL — ABNORMAL HIGH (ref 0.3–1.2)
Total Protein: 5.2 g/dL — ABNORMAL LOW (ref 6.0–8.3)

## 2010-07-16 LAB — DIFFERENTIAL
Basophils Absolute: 0 10*3/uL (ref 0.0–0.1)
Eosinophils Absolute: 0 10*3/uL (ref 0.0–0.7)
Eosinophils Relative: 0 % (ref 0–5)
Lymphocytes Relative: 9 % — ABNORMAL LOW (ref 12–46)
Lymphs Abs: 0.6 10*3/uL — ABNORMAL LOW (ref 0.7–4.0)
Lymphs Abs: 1.1 10*3/uL (ref 0.7–4.0)
Monocytes Absolute: 0 10*3/uL — ABNORMAL LOW (ref 0.1–1.0)
Neutro Abs: 5.8 10*3/uL (ref 1.7–7.7)

## 2010-07-16 LAB — POCT CARDIAC MARKERS
CKMB, poc: <1 ng/mL — ABNORMAL LOW (ref 1.0–8.0)
Myoglobin, poc: 43 ng/mL (ref 12–200)
Troponin i, poc: <0.05 ng/mL (ref 0.00–0.09)

## 2010-07-16 LAB — URINE MICROSCOPIC-ADD ON

## 2010-07-16 LAB — CULTURE, BLOOD (ROUTINE X 2)
Culture  Setup Time: 201204030451
Culture: NO GROWTH

## 2010-07-16 LAB — LIPASE, BLOOD: Lipase: 44 U/L (ref 11–59)

## 2010-07-16 LAB — URINALYSIS, ROUTINE W REFLEX MICROSCOPIC
Nitrite: NEGATIVE
Specific Gravity, Urine: 1.019 (ref 1.005–1.030)
Urobilinogen, UA: 2 mg/dL — ABNORMAL HIGH (ref 0.0–1.0)
pH: 7 (ref 5.0–8.0)

## 2010-07-16 LAB — PROTIME-INR: Prothrombin Time: 14.9 seconds (ref 11.6–15.2)

## 2010-07-16 LAB — TYPE AND SCREEN: ABO/RH(D): O POS

## 2010-07-18 ENCOUNTER — Other Ambulatory Visit: Payer: Self-pay | Admitting: Oncology

## 2010-07-18 ENCOUNTER — Encounter (HOSPITAL_BASED_OUTPATIENT_CLINIC_OR_DEPARTMENT_OTHER): Payer: Medicare Other | Admitting: Oncology

## 2010-07-18 DIAGNOSIS — B370 Candidal stomatitis: Secondary | ICD-10-CM

## 2010-07-18 DIAGNOSIS — D61818 Other pancytopenia: Secondary | ICD-10-CM

## 2010-07-18 DIAGNOSIS — C496 Malignant neoplasm of connective and soft tissue of trunk, unspecified: Secondary | ICD-10-CM

## 2010-07-18 LAB — BASIC METABOLIC PANEL
Calcium: 9.1 mg/dL (ref 8.4–10.5)
Creatinine, Ser: 1.1 mg/dL (ref 0.4–1.2)
GFR calc Af Amer: 57 mL/min — ABNORMAL LOW (ref >60)
GFR calc non Af Amer: 47 mL/min — ABNORMAL LOW (ref >60)
Glucose, Bld: 91 mg/dL (ref 70–99)
Sodium: 140 mEq/L (ref 135–145)

## 2010-07-18 LAB — CBC WITH DIFFERENTIAL/PLATELET
Basophils Absolute: 0 10*3/uL (ref 0.0–0.1)
EOS%: 0.5 % (ref 0.0–7.0)
HGB: 10.7 g/dL — ABNORMAL LOW (ref 11.6–15.9)
LYMPH%: 12.4 % — ABNORMAL LOW (ref 14.0–49.7)
MCH: 31.8 pg (ref 25.1–34.0)
MCV: 96.1 fL (ref 79.5–101.0)
MONO%: 0.5 % (ref 0.0–14.0)
Platelets: 290 10*3/uL (ref 145–400)
RDW: 13.5 % (ref 11.2–14.5)

## 2010-07-18 LAB — PROTIME-INR: INR: 1.1 (ref 0.00–1.49)

## 2010-07-18 LAB — COMPREHENSIVE METABOLIC PANEL
AST: 19 U/L (ref 0–37)
Albumin: 3.6 g/dL (ref 3.5–5.2)
Alkaline Phosphatase: 111 U/L (ref 39–117)
BUN: 16 mg/dL (ref 6–23)
Creatinine, Ser: 0.84 mg/dL (ref 0.40–1.20)
Glucose, Bld: 100 mg/dL — ABNORMAL HIGH (ref 70–99)
Potassium: 4.1 mEq/L (ref 3.5–5.3)
Total Bilirubin: 0.3 mg/dL (ref 0.3–1.2)

## 2010-07-18 LAB — APTT: aPTT: 34 seconds (ref 24–37)

## 2010-07-18 NOTE — H&P (Signed)
Veronica Ellison, Veronica Ellison                ACCOUNT NO.:  [id number]  MEDICAL RECORD NO.:  [id number]           PATIENT TYPE:  E  LOCATION:  WLED                         FACILITY:  [facility]  PHYSICIAN:  Michiel Cowboy, MDDATE OF BIRTH:  25-Sep-1921  DATE OF ADMISSION:  07/10/2010 DATE OF DISCHARGE:                             HISTORY & PHYSICAL   ATTENDING PHYSICIAN:  Dr. Sol Passer.  PRIMARY CARE PHYSICIAN:  [name], M.D.  ONCOLOGIST:  Jethro Bolus, M.D.  CHIEF COMPLAINT:  Low-grade fever and painful ulcers in the mouth.  HISTORY OF PRESENT ILLNESS:  The patient is an 77 year old female with history of sarcoma over right chest wall and now also breast cancer. According to him, to going chemotherapy IV, first chemo was on Tuesday, she is not quite sure if she has received Neupogen with her chemotherapy or not.  For the past few days, she had been having pain and mild with swallowing as well as pain that going down her chest and burning with swallowing.  She had a fever up to 102.2 at home, brought her into the emergency department.  The patient was actually admitted for chemotherapy for treatment of her sarcoma, I think she was under impression that they were treated actually her breast cancer, at least therefore she left me to believe in the beginning but after reviewing her further records, it is clear that what was treated is actually her recurrent sarcoma.  She has not had any cough.  She has not had any chest pain per se besides that pain associated with swallowing.  No shortness of breath. No dysuria.  No abdominal pain.  She has had some diarrhea for the past few days.  REVIEW OF SYSTEMS:  Otherwise review of systems negative.  PAST MEDICAL HISTORY:  Significant for 1. Paroxysmal atrial fibrillation, currently only on aspirin,     secondary to recent falls. 2. Osteoporosis. 3. History of thalamic stroke. 4. History of benign positional vertigo. 5. Hypertension. 6. Breast  cancer. 7. Osteoarthritis. 8. 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  Of note, for her sarcoma, she already undergone chemo and radiation therapy until February 2012.  She was admitted for adjuvant chemotherapy.  She received IV Adriamycin and Cytoxan, which she tolerated with some nausea and vomiting.  She was discharged on July 03, 2010.  SOCIAL HISTORY:  The patient does not smoke or drink or abuse drugs.  FAMILY HISTORY:  Noncontributory.  ALLERGIES: 1. AMOXICILLIN causes diarrhea. 2. DEMEROL and HYDROCODONE and MORPHINE cause confusion,     hallucinations. 3. NITROFURANTOIN. 4. OXYCODONE. 5. SULFA causes rash. 6. TETRACYCLINE, she does not know.  MEDICATIONS: 1. Ambien 5 mg as needed at night. 2. Compazine as needed for nausea. 3. Ondansetron 8 mg as needed for nausea. 4. Amiodarone, she takes about transiently 100 mg and 200 mg every     other day. 5. Aspirin 325 mg daily. 6. Calcium carbonate 7. Fosamax 70 weekly. 8. Omeprazole 20 mg daily. 9. Of note, she has had her diltiazem, discontinued.  PHYSICAL EXAMINATION:  VITAL SIGNS:  Temperature 100.2,  blood pressure 125/61, pulse 77, respirations 18, satting 99% room air. GENERAL:  The patient appears to be actually fairly well, in no acute distress. HEENT:  Head nontraumatic.  Dry mucous membranes.  Thrush present. Decreased skin turgor.  LUNGS:  Clear to auscultation bilaterally. HEART:  Regular rate and rhythm, with no murmurs appreciated.  Port in place with no rashes or erythema present.  A site of resection of her right chest wall sarcoma appears to be healing. LOWER EXTREMITIES:  Without clubbing, cyanosis, or edema. NEUROLOGICAL:  Grossly intact. SKIN:  Clean, dry, and intact except for darkening discoloration of her upper or lower extremities.  LABORATORY DATA:  White blood cell count 0.7, neutrophil count unable to calculate because this is low, hemoglobin 9.3  which is down from 11, platelets 75,000 which is down from 103.  Sodium 34, potassium 3.9, creatinine 0.7, albumin 2.5.  UA unremarkable.  Chest x-ray showing bronchitic changes but otherwise nothing specific.  No infiltrate.  No pneumonia.  ASSESSMENT/PLAN:  This is an 77 year old female who was recently undergone Adriamycin and Cytoxan adjuvant chemotherapy for her recurrent soft tissue sarcoma and now apparently she developed neutropenic fever with thrush. 1. Neutropenic fever, will cover broadly for now with cefepime and     vancomycin, give IV fluids.  She is actually appears to be fairly     well.  Will monitor on tele for right now.  Will transfer to step-     down if she wishes to be full code. 2. Thrush with possible candida esophagitis given pain this morning.     We will put her on fluconazole IV for now until she is able to     swallow better, also symptomatic treatment with Magic Mouthwash. 3. History of atrial fibrillation.  Continue aspirin but since her     platelets are down, will just give 81 mg of aspirin and continue     her amiodarone. 4. Prophylaxis, Protonix and SCDs.  Would avoid Lovenox as platelets     starting to go down. 5. Thrombocytopenia likely related to chemotherapy.  We will obtain     type and screen in case if she needs transfusion.  Transfusion     threshold should be around 20 given patient was febrile. 6. History of soft tissue sarcoma followed by Dr. Gaylyn Rong and Dr. Arbutus Ped     was aware of the patient's presentation to the emergency department     and he wishes to try to admit.  The patient followup in the     morning.  Continue management of her neutropenic fever. 7. Code status.  The patient wished to be full code which was     discussed with her family.     Michiel Cowboy, MD     AVD/MEDQ  D:  07/10/2010  T:  07/10/2010  Job:  [id number]  cc:   [name], M.D. Fax: [phone]  Jethro Bolus, M.D.  Electronically Signed by Therisa Doyne MD on 07/18/2010 08:42:55 PM

## 2010-07-26 ENCOUNTER — Other Ambulatory Visit: Payer: Self-pay | Admitting: Oncology

## 2010-07-26 ENCOUNTER — Encounter (HOSPITAL_BASED_OUTPATIENT_CLINIC_OR_DEPARTMENT_OTHER): Payer: Medicare Other | Admitting: Oncology

## 2010-07-26 ENCOUNTER — Inpatient Hospital Stay (HOSPITAL_COMMUNITY)
Admission: AD | Admit: 2010-07-26 | Discharge: 2010-08-02 | DRG: 847 | Disposition: A | Discharge status: Home / Self Care | Payer: Medicare Other | Source: Ambulatory Visit | Attending: Oncology | Admitting: Oncology

## 2010-07-26 DIAGNOSIS — Z5111 Encounter for antineoplastic chemotherapy: Principal | ICD-10-CM

## 2010-07-26 DIAGNOSIS — Z79899 Other long term (current) drug therapy: Secondary | ICD-10-CM

## 2010-07-26 DIAGNOSIS — K589 Irritable bowel syndrome without diarrhea: Secondary | ICD-10-CM | POA: Diagnosis present

## 2010-07-26 DIAGNOSIS — R439 Unspecified disturbances of smell and taste: Secondary | ICD-10-CM | POA: Diagnosis present

## 2010-07-26 DIAGNOSIS — Z9049 Acquired absence of other specified parts of digestive tract: Secondary | ICD-10-CM

## 2010-07-26 DIAGNOSIS — R110 Nausea: Secondary | ICD-10-CM | POA: Diagnosis not present

## 2010-07-26 DIAGNOSIS — R630 Anorexia: Secondary | ICD-10-CM | POA: Diagnosis present

## 2010-07-26 DIAGNOSIS — M810 Age-related osteoporosis without current pathological fracture: Secondary | ICD-10-CM | POA: Diagnosis present

## 2010-07-26 DIAGNOSIS — M1990 Unspecified osteoarthritis, unspecified site: Secondary | ICD-10-CM | POA: Diagnosis present

## 2010-07-26 DIAGNOSIS — N390 Urinary tract infection, site not specified: Secondary | ICD-10-CM | POA: Diagnosis not present

## 2010-07-26 DIAGNOSIS — C499 Malignant neoplasm of connective and soft tissue, unspecified: Secondary | ICD-10-CM

## 2010-07-26 DIAGNOSIS — K219 Gastro-esophageal reflux disease without esophagitis: Secondary | ICD-10-CM | POA: Diagnosis present

## 2010-07-26 DIAGNOSIS — I4891 Unspecified atrial fibrillation: Secondary | ICD-10-CM | POA: Diagnosis present

## 2010-07-26 DIAGNOSIS — H8110 Benign paroxysmal vertigo, unspecified ear: Secondary | ICD-10-CM | POA: Diagnosis present

## 2010-07-26 DIAGNOSIS — C496 Malignant neoplasm of connective and soft tissue of trunk, unspecified: Secondary | ICD-10-CM

## 2010-07-26 DIAGNOSIS — Z9010 Acquired absence of unspecified breast and nipple: Secondary | ICD-10-CM

## 2010-07-26 DIAGNOSIS — B961 Klebsiella pneumoniae [K. pneumoniae] as the cause of diseases classified elsewhere: Secondary | ICD-10-CM | POA: Diagnosis not present

## 2010-07-26 DIAGNOSIS — C493 Malignant neoplasm of connective and soft tissue of thorax: Secondary | ICD-10-CM | POA: Diagnosis present

## 2010-07-26 DIAGNOSIS — I10 Essential (primary) hypertension: Secondary | ICD-10-CM | POA: Diagnosis present

## 2010-07-26 DIAGNOSIS — Z853 Personal history of malignant neoplasm of breast: Secondary | ICD-10-CM

## 2010-07-26 DIAGNOSIS — Z7982 Long term (current) use of aspirin: Secondary | ICD-10-CM

## 2010-07-26 DIAGNOSIS — Z8673 Personal history of transient ischemic attack (TIA), and cerebral infarction without residual deficits: Secondary | ICD-10-CM

## 2010-07-26 LAB — CBC WITH DIFFERENTIAL/PLATELET
BASO%: 0.7 % (ref 0.0–2.0)
EOS%: 3.6 % (ref 0.0–7.0)
HCT: 32.3 % — ABNORMAL LOW (ref 34.8–46.6)
HGB: 10.9 g/dL — ABNORMAL LOW (ref 11.6–15.9)
MCV: 96.4 fL (ref 79.5–101.0)
NEUT#: 2.9 10*3/uL (ref 1.5–6.5)
Platelets: 270 10*3/uL (ref 145–400)
RBC: 3.36 10*6/uL — ABNORMAL LOW (ref 3.70–5.45)
RDW: 15.3 % — ABNORMAL HIGH (ref 11.2–14.5)
WBC: 4.6 10*3/uL (ref 3.9–10.3)
lymph#: 0.9 10*3/uL (ref 0.9–3.3)

## 2010-07-26 LAB — COMPREHENSIVE METABOLIC PANEL
ALT: 14 U/L (ref 0–35)
AST: 20 U/L (ref 0–37)
Calcium: 9.1 mg/dL (ref 8.4–10.5)
Chloride: 104 mEq/L (ref 96–112)
Creatinine, Ser: 0.87 mg/dL (ref 0.40–1.20)
Potassium: 4 mEq/L (ref 3.5–5.3)

## 2010-07-27 LAB — BASIC METABOLIC PANEL
BUN: 13 mg/dL (ref 6–23)
Calcium: 8.6 mg/dL (ref 8.4–10.5)
Creatinine, Ser: 0.75 mg/dL (ref 0.4–1.2)
GFR calc non Af Amer: >60 mL/min (ref >60)
Glucose, Bld: 151 mg/dL — ABNORMAL HIGH (ref 70–99)
Sodium: 139 mEq/L (ref 135–145)

## 2010-07-27 LAB — CBC
HCT: 32.2 % — ABNORMAL LOW (ref 36.0–46.0)
MCHC: 32.6 g/dL (ref 30.0–36.0)
RDW: 15.6 % — ABNORMAL HIGH (ref 11.5–15.5)

## 2010-07-28 DIAGNOSIS — C499 Malignant neoplasm of connective and soft tissue, unspecified: Secondary | ICD-10-CM

## 2010-07-28 LAB — BASIC METABOLIC PANEL
BUN: 17 mg/dL (ref 6–23)
Calcium: 8.2 mg/dL — ABNORMAL LOW (ref 8.4–10.5)
Creatinine, Ser: 0.75 mg/dL (ref 0.4–1.2)
GFR calc non Af Amer: >60 mL/min (ref >60)
Potassium: 3.7 mEq/L (ref 3.5–5.1)

## 2010-07-28 LAB — MAGNESIUM: Magnesium: 2 mg/dL (ref 1.5–2.5)

## 2010-07-29 ENCOUNTER — Inpatient Hospital Stay (HOSPITAL_COMMUNITY): Payer: Medicare Other

## 2010-07-29 LAB — URINALYSIS, ROUTINE W REFLEX MICROSCOPIC
Bilirubin Urine: NEGATIVE
Ketones, ur: NEGATIVE mg/dL
Nitrite: NEGATIVE
Protein, ur: NEGATIVE mg/dL
Urobilinogen, UA: 0.2 mg/dL (ref 0.0–1.0)

## 2010-07-30 DIAGNOSIS — C499 Malignant neoplasm of connective and soft tissue, unspecified: Secondary | ICD-10-CM

## 2010-07-30 LAB — BASIC METABOLIC PANEL
CO2: 26 mEq/L (ref 19–32)
Calcium: 8.3 mg/dL — ABNORMAL LOW (ref 8.4–10.5)
Chloride: 107 mEq/L (ref 96–112)
Creatinine, Ser: 0.67 mg/dL (ref 0.4–1.2)
Glucose, Bld: 120 mg/dL — ABNORMAL HIGH (ref 70–99)

## 2010-07-30 LAB — CBC
HCT: 31.2 % — ABNORMAL LOW (ref 36.0–46.0)
Hemoglobin: 10.1 g/dL — ABNORMAL LOW (ref 12.0–15.0)
MCH: 31.1 pg (ref 26.0–34.0)
MCHC: 32.4 g/dL (ref 30.0–36.0)
RBC: 3.25 MIL/uL — ABNORMAL LOW (ref 3.87–5.11)

## 2010-07-31 LAB — BASIC METABOLIC PANEL
BUN: 20 mg/dL (ref 6–23)
Calcium: 8.5 mg/dL (ref 8.4–10.5)
GFR calc non Af Amer: >60 mL/min (ref >60)
Potassium: 3.6 mEq/L (ref 3.5–5.1)

## 2010-07-31 LAB — URINE CULTURE: Culture  Setup Time: 201204221353

## 2010-07-31 LAB — MAGNESIUM: Magnesium: 1.8 mg/dL (ref 1.5–2.5)

## 2010-08-01 LAB — BASIC METABOLIC PANEL
Calcium: 8 mg/dL — ABNORMAL LOW (ref 8.4–10.5)
GFR calc Af Amer: >60 mL/min (ref >60)
GFR calc non Af Amer: >60 mL/min (ref >60)
Potassium: 3.4 mEq/L — ABNORMAL LOW (ref 3.5–5.1)
Sodium: 136 mEq/L (ref 135–145)

## 2010-08-01 LAB — CBC
HCT: 30.9 % — ABNORMAL LOW (ref 36.0–46.0)
Hemoglobin: 10.3 g/dL — ABNORMAL LOW (ref 12.0–15.0)
MCHC: 33.3 g/dL (ref 30.0–36.0)
RDW: 15.6 % — ABNORMAL HIGH (ref 11.5–15.5)
WBC: 5.7 10*3/uL (ref 4.0–10.5)

## 2010-08-01 LAB — MAGNESIUM: Magnesium: 1.7 mg/dL (ref 1.5–2.5)

## 2010-08-02 LAB — BASIC METABOLIC PANEL
Calcium: 8.1 mg/dL — ABNORMAL LOW (ref 8.4–10.5)
GFR calc Af Amer: >60 mL/min (ref >60)
GFR calc non Af Amer: >60 mL/min (ref >60)
Potassium: 3.6 mEq/L (ref 3.5–5.1)
Sodium: 139 mEq/L (ref 135–145)

## 2010-08-04 ENCOUNTER — Encounter (HOSPITAL_BASED_OUTPATIENT_CLINIC_OR_DEPARTMENT_OTHER): Payer: Medicare Other | Admitting: Oncology

## 2010-08-04 DIAGNOSIS — Z5189 Encounter for other specified aftercare: Secondary | ICD-10-CM

## 2010-08-04 DIAGNOSIS — C496 Malignant neoplasm of connective and soft tissue of trunk, unspecified: Secondary | ICD-10-CM

## 2010-08-07 NOTE — Discharge Summary (Signed)
Veronica Ellison, NO                ACCOUNT NO.:  [id number]  MEDICAL RECORD NO.:  [id number]           PATIENT TYPE:  I  LOCATION:  1317                         FACILITY:  [facility]  PHYSICIAN:  Exie Parody, M.D.        DATE OF BIRTH:  07-11-1921  DATE OF ADMISSION:  07/26/2010 DATE OF DISCHARGE:  08/02/2010                              DISCHARGE SUMMARY   DISCHARGE DIAGNOSES: 1. 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 2. Urinary tract infection. 3. Hypertension. 4. History of paroxysmal atrial fibrillation. 5. History of thalamic cerebral vascular accident. 6. History of irritable bowel syndrome. 7. Osteoporosis. 8. Osteoarthritis.  STUDIES:  Chest x-ray performed on July 29, 2010, for palpitation which showed stable mild cardiomegaly, mild changes in COPD, and chronic bronchitis.  She has minimal small miniscule bilateral pleural effusions and mild left basilar scarring without acute changes.  BRIEF HISTORY OF PRESENT ILLNESS:  Veronica Ellison is an 77 year old woman with history of multiple recurrences of soft tissue sarcoma of the right posterior chest wall.  She underwent multiple resections and adjuvant radiation therapy, the last resection was with positive margins. Therefore, she had second opinion evaluation by Dr. Orvilla Fus from [facility], who recommended the patient receive adjuvant chemotherapy to decrease the risk of recurrence.  She received the first cycle a month ago and was admitted for this admission for the second cycle.  HOSPITAL COURSE: 1. Recurrence soft tissue sarcoma:  Ms. Empie was started on     continuous intravenous infusions of Cytoxan and Adriamycin.  The     dose of Cytoxan was 700 mg/sq m over the course of 7 days and     doxorubicin was 70 mg/sq m over the course of 7 days continuous IV     infusion.  She received premedications with Emend, dexamethasone,     Aloxi prior to starting chemo on  day 1 and received dexamethasone 4 mg     p.o. b.i.d. day 0 to 7 for delay emesis.  She had some nauseous     sensation during the course of chemotherapy, however, did not have     problem with frank vomiting.  She did not have any other side     effects from chemotherapy during this hospital course. 2. Hypertension:  She had history of chest pain and was given Imdur     and on diltiazem for history of paroxysmal AFib.  Both these     medications were discontinued by her cardiologist given that she no     longer has recurrent chest pain and she was becoming bradycardic.     With the use of dexamethasone for delayed emesis from chemotherapy,     she had hypertensive episode with systolic blood pressure up to     high in 170s.  Therefore, IV fluid discontinued on day #6 of     chemotherapy and she was started on amlodipine 5 mg p.o. daily.     The patient did not have any problem with hypertensive crisis with     the headache, visual  changes, chest pain or hematuria, shortness of     breath during this hospital course. 3. History of oral thrush.  After the first cycle of chemotherapy, she     was placed on clotrimazole troches to prevent this problem. 4. History of paroxysmal atrial fibrillation.  She is on amiodarone as     an outpatient. 5. History of osteoporosis.  Her amiodarone was held while she was in     the hospital to decrease the risk of peptic ulcer disease. 6. Urinary tract infection.  On July 29, 2010, she developed frequent     urinations at night most likely secondary to use of IV fluid;     however, UA was negative, however, urine culture showed     Klebsiella sensitive to Levaquin and therefore she was placed on     Levaquin.  She has allergies to sulfa, therefore, Bactrim was not     used.  DISCHARGE EXAMINATION:  VITAL SIGNS:  Temperature 98.4, heart rate 61, respiratory rate 20, blood pressure 168/70, and O2 sat 95% on room air. GENERAL:  Well-nourished,  thin-appearing woman, in no acute distress. There was no scleral icterus.  There was no oropharyngeal lesions. NECK:  Supple. LYMPHATICS:  Lymph exam was negative for cervical, supraclavicular, axillary adenopathy. LUNGS:  Her lungs were clear bilaterally without wheezing or crackles. CARDIAC:  Regular rate and rhythm.  S1, S2 without murmur, rub or gallop. ABDOMEN:  Soft, flat, nontender, nondistended without organomegaly. There was no pedal edema. SKIN:  Exam show ecchymosis and bruises in the lower extremities and extensive surface of her hands without any active bleeding.  Her Port-A- Cath was clean, dry, and intact without any pain.  LABORATORY:  Creatinine 0.58.  DISCHARGE CONDITION:  Stable.  DISCHARGE ACTIVITY:  Increase activity slowly as tolerated.  DISCHARGE DIET:  Regular diet.  DISCHARGE FOLLOW UP:  [facility] on 08[facility]08/10.    DISCHARGE MEDICATIONS: 1. Amlodipine 5 mg p.o. daily. 2. Mycelex troche 10 mg 3 times a day. 3. Dexamethasone 4 mg p.o. b.i.d. for 4 days for delayed nausea/vomiting. 4. Levaquin 500 mg p.o. daily for 2 days. 5. Ambien 5 mg p.o. q.h.s. p.r.n. insomnia. 6. Amiodarone 200 mg PO daily. 7. Ativan 1 mg PO every other day prn. 8. Aspirin 325 mg p.o. daily. 9. Calcium carbonate with vitamin D 600/400 mg b.i.d. 10.Claritin 10 mg p.o. daily p.r.n. 11.Compazine 10 mg p.o. q.6 h. P.r.n. nausea and vomiting. 12.Fosamax 70 mg p.o. every week. 13.Imodium 2 mg p.o. every 8 hours p.r.n. diarrhea. 14.Lomotil 1 capsule 4 times daily p.r.n. diarrhea. 15.Magnesium oxide 400 mg p.o. b.i.d. 16.Multivitamin tab p.o. daily. 17.Nitroglycerin p.r.n. chest pain. 18.Omeprazole 20 mg p.o. q.a.m. 19.Zofran 4 mg p.o. sublingual b.i.d. p.r.n. nausea and vomiting. 20.Percocet 1 tablet of 5/325 mg q.6 h. P.r.n. nausea and vomiting. 21.Systane eyedrop in both eyes twice daily p.r.n.     Exie Parody, M.D.     HTH/MEDQ  D:  08/02/2010  T:  08/02/2010  Job:   [id number]  Electronically Signed by Jethro Bolus MD on 08/07/2010 02:05:10 PM

## 2010-08-07 NOTE — H&P (Signed)
[name], [name]                ACCOUNT NO.:  [id number]  MEDICAL RECORD NO.:  [id number]           PATIENT TYPE:  I  LOCATION:  1317                         FACILITY:  [facility]  PHYSICIAN:  Jethro Bolus, M.D.          DATE OF BIRTH:  [birth year]  DATE OF ADMISSION:  07/26/2010 DATE OF DISCHARGE:                             HISTORY & PHYSICAL   REASON FOR ADMISSION:  Elective chemotherapy, continuous IV infusion for 7 days for recurrent sarcoma of the right posterior chest wall.  HISTORY OF PRESENT ILLNESS:  [name] is an 77 year old woman with history of recurrence soft tissue sarcoma.  She was first diagnosed on August 04, 2008, and has had multiple recurrences.  The last recurrence, she underwent resection with positive margin.  She also had adjuvant radiation therapy after multiple recurrences.  With the last recurrence, she underwent a second opinion with Dr. Orvilla Fus at [facility] sarcoma program who recommended continuous IV chemotherapy 7 days given her multiple recurrences.  She received the first cycle of adjuvant chemotherapy little more than 4 weeks ago and is here for the second cycle.  She is here with her daughter.  She reports that she feels relatively well.  Over the last few days, she has bounced back completely 100% compared to prior to chemotherapy.  She still has abnormal taste and decreased appetite.  She has some intermittent headache.  She denied nausea, vomiting, mucositis, dysphagia, odynophagia, abdominal pain, swelling, diarrhea, constipation.  She has intermittent irritable bowel syndrome with intermittent diarrhea and constipation, however, none in the last few days.  She denies hematochezia, melena, hematuria, vaginal bleeding.  She has chemosis in bilateral extensor surface of forehands; however, no easy bruisability anywhere else.  The rest of the 14-point review of system was negative.  PAST MEDICAL HISTORY: 1. Recurrent soft tissue  sarcoma. 2. Paroxysmal atrial fibrillation.  She has been on Coumadin; however,     this was discontinued.  She is also off diltiazem given the fact     that she had bradycardia at last hospital course. 3. Benign positional vertigo. 4. History of acute thalamic stroke in December of 2011. 5. Hypertension. 6. History of breast carcinoma. 7. Osteopenia. 8. Osteoarthritis.  PAST SURGICAL HISTORY: 1. Multiple right posterior chest wall resections for sarcoma. 2. Cataract removal. 3. Breast mastectomy. 4. Left hand fracture repair. 5. Hysterectomy. 6. Oophorectomy from benign ovarian complex cyst. 7. History of cholecystectomy. 8. 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  ALLERGIES: 1. AMOXICILLIN 2. MORPHINE SULFATE. 3. MEPERIDINE. 4. TETRACYCLINE. 5. SULFA. 6. NITROFURANTOIN.  CURRENT MEDICATIONS:  Current outpatient medications: 1. Amiodarone 200 mg alternating 1/2 tablet and 1 tablet every other     day. 2. Imdur 15 mg p.o. daily. 3. Magnesium oxide 400 mg p.o. b.i.d. p.r.n. 4. Nitroglycerin p.r.n. 5. Compazine p.r.n. nausea and vomiting. 6. Imodium p.r.n. diarrhea. 7. Lomotil p.r.n. diarrhea. 8. Magic mouthwash p.r.n. mucositis. 9. Alendronate 70 mg p.o. every week. 10.Omeprazole 20 mg p.o. daily. 11.Zofran p.r.n. nausea and vomiting. 12.Calcium with vitamin D 600/200 mg  1 tablet p.o. b.i.d. 13.Percocet p.r.n. pain. 14.Ambien 5 mg p.o. q.h.s. p.r.n. 15.Aspirin 325 mg p.o. daily. 16.Multivitamin 1 tab p.o. daily .  SOCIAL HISTORY:  She denies history of smoking, alcohol, IV drug use. The patient lives with her husband.  FAMILY HISTORY:  Father deceased from heart disease and as did her mother.  She has 5 brothers and one sister, all of whom have deceased. One of her brothers had metastatic prostate cancer and three others had coronary artery disease.  There is no family history of sarcoma.  PHYSICAL EXAMINATION:  VITAL  SIGNS:  Temperature 96.9, heart rate 73, respiratory rate 16, blood pressure 144/64, weight of 106.9 pounds. ECOG performance status of 0 to 1. GENERAL:  Thin-appearing woman, in no acute distress. HEENT:  There was no scleral icterus.  There was no oropharyngeal lesion.  I did not appreciate any whitish exudate discharge. LUNGS:  Her lungs were clear bilaterally without wheezing or crackles. CARDIAC EXAM:  Regular rate and rhythm.  S1, S2 without murmur, rub, or gallop. ABDOMEN:  Soft, flat, nontender, nondistended without organomegaly. EXTREMITIES:  There was no pedal edema. NEURO EXAM:  Nonfocal.  Her right posterior chest wall showed resection scars; however, no subcu nodules or pain to palpation.  LABORATORY:   WBC 4.6, hemoglobin 10.9, platelet count of 270.   CMET is pending.  ASSESSMENT/PLAN: 1. Recurrent soft tissue sarcoma:  She will proceed with cycle #2 for     7 days continuous Adriamycin, Cytoxan today.  She has antiemetic to     decrease the risk of nausea, vomiting.  To decrease the risk of     neutropenic fever like last time when the chemotherapy is finished     the day after on August 03, 2010, she will return to clinic for     Neulasta injection to decrease that risk. 2. Atrial fibrillation in the past.  She is on amiodarone.  Given her     bradycardia, she has been off diltiazem. 3. Hypertension.  She is on Imdur which has been disonctinued by     cardiologist. 4. History of intermittent chest pain.  She has nitroglycerin p.r.n. 5. History of thalamic stroke.  She is on aspirin. 6. Gastroesophageal reflux disease.  She is on omeprazole per PCP.  CODE STATUS:  Full code.  PROPHYLAXIS:  Lovenox 40 mg subcu daily.     Jethro Bolus, M.D.     HH/MEDQ  D:  07/26/2010  T:  07/26/2010  Job:  [id number]  Electronically Signed by Jethro Bolus MD on 08/07/2010 01:56:12 PM

## 2010-08-08 ENCOUNTER — Encounter (HOSPITAL_BASED_OUTPATIENT_CLINIC_OR_DEPARTMENT_OTHER): Payer: Medicare Other | Admitting: Oncology

## 2010-08-08 ENCOUNTER — Other Ambulatory Visit: Payer: Self-pay | Admitting: Oncology

## 2010-08-08 DIAGNOSIS — C496 Malignant neoplasm of connective and soft tissue of trunk, unspecified: Secondary | ICD-10-CM

## 2010-08-08 LAB — COMPREHENSIVE METABOLIC PANEL
Albumin: 3 g/dL — ABNORMAL LOW (ref 3.5–5.2)
Alkaline Phosphatase: 67 U/L (ref 39–117)
Calcium: 7.9 mg/dL — ABNORMAL LOW (ref 8.4–10.5)
Chloride: 103 mEq/L (ref 96–112)
Glucose, Bld: 92 mg/dL (ref 70–99)
Potassium: 3.6 mEq/L (ref 3.5–5.3)
Sodium: 138 mEq/L (ref 135–145)
Total Protein: 4.8 g/dL — ABNORMAL LOW (ref 6.0–8.3)

## 2010-08-08 LAB — CBC WITH DIFFERENTIAL/PLATELET
Basophils Absolute: 0 10*3/uL (ref 0.0–0.1)
EOS%: 5.6 % (ref 0.0–7.0)
Eosinophils Absolute: 0 10*3/uL (ref 0.0–0.5)
HCT: 29.1 % — ABNORMAL LOW (ref 34.8–46.6)
HGB: 10 g/dL — ABNORMAL LOW (ref 11.6–15.9)
MCH: 33 pg (ref 25.1–34.0)
MONO#: 0 10*3/uL — ABNORMAL LOW (ref 0.1–0.9)
NEUT#: 0.1 10*3/uL — CL (ref 1.5–6.5)
RDW: 15.6 % — ABNORMAL HIGH (ref 11.2–14.5)
WBC: 0.5 10*3/uL — CL (ref 3.9–10.3)
lymph#: 0.3 10*3/uL — ABNORMAL LOW (ref 0.9–3.3)

## 2010-08-09 NOTE — Consult Note (Signed)
NAMEBRIENNA, Veronica Ellison                ACCOUNT NO.:  [id number]  MEDICAL RECORD NO.:  [id number]           PATIENT TYPE:  I  LOCATION:  1418                         FACILITY:  [facility]  PHYSICIAN:  Corky Crafts, MDDATE OF BIRTH:  02-09-1922  DATE OF CONSULTATION:  07/13/2010 DATE OF DISCHARGE:  07/14/2010                                CONSULTATION   PRIMARY CARE PHYSICIAN:  Dr. Cam Hai  REFERRING PHYSICIAN:  Talmage Nap, MD  REASON FOR CONSULTATION:  Chest pain.  HISTORY OF PRESENT ILLNESS:  The patient is an 77 year old woman, well known to me.  She has had paroxysmal atrial fibrillation, managed with amiodarone.  She has recently been taken off Coumadin because of concerns about bleeding.  She has been switched to aspirin.  She was admitted because of neutropenic fever after chemotherapy, which she receives for sarcoma, last night, she had an episode of chest pain, which was relieved with sublingual nitroglycerin.  She has had intermittent chest pain, which for the most part has been atypical.  She has not had any exertional chest pain.  She does feel somewhat weak and tired.  She had an echocardiogram a couple of weeks ago, which showed normal LV function.  She has not had a recent ischemia evaluation; however, she has been going through a lot of other medical issues from her sarcoma.  She has been anemic and overall fatigue.  Cardiac enzymes were checked and she had a normal CK and CK-MB, but her troponin was minimally elevated.  We are asked to further evaluate.  PAST MEDICAL HISTORY: 1. Atrial fibrillation. 2. Hypertension. 3. Sarcoma. 4. Diverticulosis. 5. History of breast cancer. 6. Osteoarthritis. 7. Hyperlipidemia.  HOME MEDICATIONS: 1. Aspirin 325 mg daily. 2. Amiodarone 200 mg daily. 3. Fosamax. 4. Omeprazole 20 mg daily. 5. Percocet. 6. Ambien. 7. Zofran.  ALLERGIES: 1. TETRACYCLINE. 2. SULFA. 3. MACRODANTIN. 4. DEMEROL. 5.  AMOXICILLIN. 6. HYDROCODONE. 7. MORPHINE.  SOCIAL HISTORY:  She does not smoke or drink.  She is married.  FAMILY HISTORY:  She had a son who died of pancreatic cancer.  PAST SURGICAL HISTORY:  Mastectomy, hysterectomy, cholecystectomy, wrist surgery, colonoscopy, multiple sarcoma surgeries, colon resection.  REVIEW OF SYSTEMS:  Significant for overall fatigue; some dyspnea on exertion; a pain or pressure feeling intermittently in her chest, sometimes in the center, sometimes in the left side of her chest.  No associated diaphoresis or nausea.  No bleeding problems.  No focal weakness.  All other systems negative.  PHYSICAL EXAMINATION:  VITAL SIGNS:  Blood pressure 149/64, heart rate 76. GENERAL:  She is awake and alert in no apparent distress. HEAD:  Normocephalic, atraumatic. EYES:  Extraocular movements intact. NECK:  No JVD. CARDIOVASCULAR:  Regular rate and rhythm.  S1, S2. LUNGS:  Clear to auscultation bilaterally. ABDOMEN:  Soft, nontender, nondistended. EXTREMITIES:  No edema.  Palpable pedal pulses. NEURO:  No focal motor or sensory deficits. SKIN:  No rash. BACK:  Mild kyphosis. PSYCH:  Normal mood and affect.  LABORATORY DATA:  Lab work shows hemoglobin of 8.6, creatinine 0.71, CK 29, MB 3.0, troponin 0.22.  CK  24, MB 2.9, troponin 0.19.  CK 31, MB 3.8, troponin 0.23.  Prior ECG showed evidence of LVH with some minimal ST-segment changes and possible anteroseptal infarct.  Chest x-ray showed small pleural effusions, left base scar versus atelectasis.  ASSESSMENT/PLAN:  An 77 year old with minimally elevated troponin in the setting of multiple other medical issues.  PLAN: 1. Cardiac:  She certainly could have some degree of coronary artery     disease.  I doubt this minimally elevated troponin represents an     acute coronary syndrome, given that her CK and her CK-MB are     normal.  She does not have any exertional symptoms.  Her chest     discomfort is  relieved with nitroglycerin.  We discussed ischemia     workup and it does not seem practical at this time, given that she     is going through chemotherapy and does not feel well at times from     that.  She would not want either stress test or catheterization at     this time.  I think that is very reasonable.  She will be given a     prescription  for sublingual nitroglycerin.  If she feels that she     is using this frequently, we will then give her Imdur 30 mg daily.     There is no need for anticoagulation at this time.  I think she can     be discharged from a cardiac standpoint. 2. Atrial fibrillation.  Continue amiodarone.  She is maintained sinus     rhythm for the most part. 3. She is not a Coumadin candidate.  Continue aspirin. 4. This plan of conservative management was discussed with the patient     and her family and everyone was in agreement.     Corky Crafts, MD     JSV/MEDQ  D:  07/14/2010  T:  07/15/2010  Job:  [id number]  Electronically Signed by Lance Muss MD on 08/09/2010 01:29:36 PM

## 2010-08-21 ENCOUNTER — Emergency Department (HOSPITAL_COMMUNITY): Payer: Medicare Other

## 2010-08-21 ENCOUNTER — Inpatient Hospital Stay (HOSPITAL_COMMUNITY)
Admission: EM | Admit: 2010-08-21 | Discharge: 2010-08-28 | DRG: 478 | Disposition: A | Discharge status: Skilled Nursing Facility | Payer: Medicare Other | Attending: Internal Medicine | Admitting: Internal Medicine

## 2010-08-21 DIAGNOSIS — I4891 Unspecified atrial fibrillation: Secondary | ICD-10-CM | POA: Diagnosis present

## 2010-08-21 DIAGNOSIS — Y92009 Unspecified place in unspecified non-institutional (private) residence as the place of occurrence of the external cause: Secondary | ICD-10-CM

## 2010-08-21 DIAGNOSIS — I951 Orthostatic hypotension: Secondary | ICD-10-CM | POA: Diagnosis present

## 2010-08-21 DIAGNOSIS — I472 Ventricular tachycardia, unspecified: Secondary | ICD-10-CM | POA: Diagnosis present

## 2010-08-21 DIAGNOSIS — W010XXA Fall on same level from slipping, tripping and stumbling without subsequent striking against object, initial encounter: Secondary | ICD-10-CM | POA: Diagnosis present

## 2010-08-21 DIAGNOSIS — I10 Essential (primary) hypertension: Secondary | ICD-10-CM | POA: Diagnosis present

## 2010-08-21 DIAGNOSIS — R0902 Hypoxemia: Secondary | ICD-10-CM | POA: Diagnosis present

## 2010-08-21 DIAGNOSIS — T451X5A Adverse effect of antineoplastic and immunosuppressive drugs, initial encounter: Secondary | ICD-10-CM | POA: Diagnosis present

## 2010-08-21 DIAGNOSIS — IMO0002 Reserved for concepts with insufficient information to code with codable children: Secondary | ICD-10-CM

## 2010-08-21 DIAGNOSIS — S32009A Unspecified fracture of unspecified lumbar vertebra, initial encounter for closed fracture: Principal | ICD-10-CM | POA: Diagnosis present

## 2010-08-21 DIAGNOSIS — M25559 Pain in unspecified hip: Secondary | ICD-10-CM | POA: Diagnosis present

## 2010-08-21 DIAGNOSIS — D6481 Anemia due to antineoplastic chemotherapy: Secondary | ICD-10-CM | POA: Diagnosis present

## 2010-08-21 DIAGNOSIS — C493 Malignant neoplasm of connective and soft tissue of thorax: Secondary | ICD-10-CM | POA: Diagnosis present

## 2010-08-21 DIAGNOSIS — T426X5A Adverse effect of other antiepileptic and sedative-hypnotic drugs, initial encounter: Secondary | ICD-10-CM | POA: Diagnosis present

## 2010-08-21 DIAGNOSIS — Z853 Personal history of malignant neoplasm of breast: Secondary | ICD-10-CM

## 2010-08-21 DIAGNOSIS — Z8673 Personal history of transient ischemic attack (TIA), and cerebral infarction without residual deficits: Secondary | ICD-10-CM

## 2010-08-21 DIAGNOSIS — I4729 Other ventricular tachycardia: Secondary | ICD-10-CM | POA: Diagnosis present

## 2010-08-21 LAB — POCT I-STAT, CHEM 8
Creatinine, Ser: 1 mg/dL (ref 0.4–1.2)
Glucose, Bld: 120 mg/dL — ABNORMAL HIGH (ref 70–99)
Hemoglobin: 9.2 g/dL — ABNORMAL LOW (ref 12.0–15.0)
Sodium: 136 mEq/L (ref 135–145)
TCO2: 27 mmol/L (ref 0–100)

## 2010-08-21 LAB — CK TOTAL AND CKMB (NOT AT ARMC): CK, MB: 2 ng/mL (ref 0.3–4.0)

## 2010-08-21 LAB — PROTIME-INR: INR: 0.89 (ref 0.00–1.49)

## 2010-08-21 LAB — TROPONIN I: Troponin I: <0.3 ng/mL (ref <0.30)

## 2010-08-21 NOTE — Op Note (Signed)
NAMEGHALIA, REICKS                ACCOUNT NO.:  [id number]   MEDICAL RECORD NO.:  [id number]          PATIENT TYPE:  AMB   LOCATION:  DSC                          FACILITY:  [facility]   PHYSICIAN:  Gabrielle Dare. Janee Morn, M.D.DATE OF BIRTH:  May 01, 1921   DATE OF PROCEDURE:  08/04/2008  DATE OF DISCHARGE:                               OPERATIVE REPORT   PREOPERATIVE DIAGNOSIS:  Mass right side.   POSTOPERATIVE DIAGNOSIS:  Mass right side.   PROCEDURE:  Excision of 6-cm intramuscular mass on the right side with  layered closure.   SURGEON:  Gabrielle Dare. Janee Morn, MD   ANESTHESIA:  General with laryngeal mask airway.   HISTORY OF PRESENT ILLNESS:  Veronica Ellison is an 77 year old white female  who I evaluated in the office for a symptomatic mass on her right side.  She presents for elective excision.   PROCEDURE IN DETAIL:  Informed consent was obtained.  The patient was  identified in the preop holding area.  She received intravenous  antibiotics and was brought to the operating room.  General anesthesia  with laryngeal mask airway was administered by the anesthesia staff and  she was placed in a lateral position with right side up.  Her right side  was prepped and draped in a sterile fashion.  Marcaine 0.25% with  epinephrine was injected around this easily palpable mass which was  approximately 6 cm long.  The incision was made along the tissue lines.  Subcutaneous tissues were dissected down revealing this mass to be  intramuscular just beneath the fascia.  The mass was circumferentially  dissected and excised in one piece and there were muscular layers intact  beneath it.  The mass was sent to pathology.  The wound was copiously  irrigated and meticulous hemostasis was obtained.  The wound was then  closed in layers with subcutaneous tissues approximated with interrupted  3-0 Vicryl sutures and the skin closed with running 4-0 Monocryl  subcuticular stitch followed by Dermabond.  The mass  was approximately 6  cm long x 5 cm wide.  Sponge, needle, and instrument counts were  correct.  The patient tolerated the procedure well without apparent  complication and was taken to recovery room in stable condition.      Gabrielle Dare Janee Morn, M.D.  Electronically Signed     BET/MEDQ  D:  08/04/2008  T:  08/04/2008  Job:  [id number]

## 2010-08-21 NOTE — Consult Note (Signed)
Veronica Ellison, Veronica Ellison NO.:  [id number]   MEDICAL RECORD NO.:  [id number]          PATIENT TYPE:  INP   LOCATION:  4703                         FACILITY:  [facility]   PHYSICIAN:  Bernette Redbird, M.D.   DATE OF BIRTH:  [birth year]   DATE OF CONSULTATION:  DATE OF DISCHARGE:                                 CONSULTATION   Dr. Crista Curb of the [facility] asked [place] to see this 77-  year-old female because of choledocholithiasis.   Veronica Ellison was admitted through the emergency room today with a roughly  12-hour history of epigastric abdominal pain going through to her back,  and some chills.   She is known to have a common duct stone based on evaluation about a  year and a half ago.  She underwent an attempted ERCP by Dr. Evette Cristal at  that time which was unsuccessful, so she then went to [facility]  where an ERCP with sphincterotomy was performed, but apparently no stone  was seen or extracted.  We have a copy of the discharge summary, but  unfortunately not a copy of the procedure report.   In any event, the patient has not been all that well in the ensuing  year.  She was in the emergency room last September with fevers and  chills, at which time a specific source was not identified.  Liver  chemistries at that time were elevated with an AST of 92.  She was  apparently released.  Since then, she has had smoldering symptoms with  nausea and food intolerance, as well as periodic chills.  However,  things have been especially bad over the past couple of months and then  really came to ahead this morning.   In the emergency room today, the patient was known to have significant  elevation in liver chemistries and an abdominal ultrasound showing a 10-  mm common duct stone.  It is unclear whether or not the patient had a  biliary stent placed at some point, but there is no mention of a stent  on the current ultrasound.  In any event, inpatient care was felt  to be  appropriate and we were asked to see the patient.  Of note, the patient  is chronically on Coumadin for atrial fibrillation, but has been off of  it for the past 5 days or so in anticipation of a hip injection that was  going to be done in the near future.   ALLERGIES:  DEMEROL, SULFA, TETRACYCLINE, NITROFURANTOIN, MORPHINE,  AMOXICILLIN, and HYDROCODONE.   OPERATIONS:  Recent surgery on her right side for a mass that  fortunately turned out to be benign on pathologic review.  She has a  history of a remote laparoscopic cholecystectomy.  She had the above-  mentioned biliary sphincterotomy at [facility].   MEDICAL ISSUES:  The above-mentioned benign tumor, choledocholithiasis,  atrial fibrillation on Coumadin, and DJD.   HABITS:  Nonsmoker, nondrinker.   OUTPATIENT MEDICATIONS:  Multiple vitamins, Coumadin, amiodarone,  Prilosec, and diltiazem.   FAMILY HISTORY:  Not obtained.   SOCIAL HISTORY:  Married, lives with husband.   REVIEW OF SYSTEMS:  The patient generally is without shortness of  breath, although she says recently she has been a little bit short of  breath, perhaps due to the heat.  No chest pain.  She has a tendency  toward constipation, but does not require laxatives.   PHYSICAL EXAMINATION:  GENERAL:  A very pleasant, talkative Caucasian  female in no evident distress, appearing neither anxious nor depressed.  VITAL SIGNS:  Blood pressure 119/48, heart rate 69, temperature 98.9.  HEENT:  The patient is anicteric.  CHEST:  Clear to auscultation.  HEART:  At this time, the rhythm seems to be regular and I do not  appreciate any murmurs.  ABDOMEN:  Benign.  No organomegaly, guarding, mass, or tenderness with  particular reference to the right upper quadrant.   LABORATORY DATA:  White count 6400 with 90 polys and 9 lymphs.  INR 1.1.  Chemistry panel pertinent for BUN 19, creatinine 1.03, total bilirubin  1.7, alk phos 215, AST 242, ALT 91, albumin is  3.6.  Troponins negative.   Ultrasound shows a dilated duct and a 10 mm common duct stone.   IMPRESSION:  Choledocholithiasis with recurrent, smoldering cholangitis,  not really septic cholangitis at the moment.   PLAN:  Reattempt ERCP and stone extraction tomorrow.  Since a biliary  sphincterotomy was performed at [facility] about a year ago,  hopefully we will be able to get access to the common duct which we were  unable to do previously.  I am quite sure that a lot of the patient's  nausea and intermittent nonspecific gastric malaise symptoms have  probably been due to her common duct stone and low-grade subclinical  smoldering cholangitis, and I would anticipate those symptoms to improve  if and when successful stone extraction is achieved.           ______________________________  Bernette Redbird, M.D.     RB/MEDQ  D:  10/02/2008  T:  10/03/2008  Job:  [id number]   cc:   Donia Guiles, M.D.  James L. Malon Kindle., M.D.

## 2010-08-21 NOTE — H&P (Signed)
Veronica Ellison, Veronica Ellison                ACCOUNT NO.:  [id number]   MEDICAL RECORD NO.:  [id number]          PATIENT TYPE:  INP   LOCATION:  4703                         FACILITY:  [facility]   PHYSICIAN:  Corinna L. Lendell Caprice, MDDATE OF BIRTH:  January 06, 1922   DATE OF ADMISSION:  10/02/2008  DATE OF DISCHARGE:                              HISTORY & PHYSICAL   CHIEF COMPLAINT:  Nausea and abdominal pain.   HISTORY OF PRESENT ILLNESS:  Veronica Ellison is an 77 year old white female  who presents with sudden onset epigastric pain.  She also had nausea and  dry heaves.  She has a history of cholecystectomy, but had similar  episode in the past with choledocholithiasis requiring an ERCP.  The  patient denies any fevers or chills.  She is thirsty.   PAST MEDICAL HISTORY:  1. Atrial fibrillation.  2. Degenerative disk disease, in fact she was scheduled to have an      injection tomorrow for radiculopathy, with Dr. Danielle Dess.  3. Status post cholecystectomy.  4. Osteoarthritis.  5. History of breast cancer status post left mastectomy.   MEDICATIONS:  Not entirely clear but she is on:  1. Diltiazem, dose unknown.  2. Amiodarone 200 mg a day.  3. Prilosec 20 mg a day.  4. Multivitamin with iron daily.  5. Coumadin, dose unknown.   ALLERGIES:  She reports an intolerance to AMOXICILLIN which causes  severe diarrhea and MACROBID caused swelling.  TETRACYCLINE, SULAR,  HYDROCODONE, and DEMEROL are listed as allergies as well but she cannot  recall her reaction.   SOCIAL HISTORY:  The patient is married.  She does not drink or smoke.   FAMILY HISTORY:  Noncontributory.   REVIEW OF SYSTEMS:  As above, otherwise negative.   PHYSICAL EXAMINATION:  VITAL SIGNS:  Her temperature is 97.5, blood  pressure 104/51, pulse 64, respiratory rate 18, and oxygen saturation  99% on room air.  GENERAL:  The patient appears younger than her stated age.  She is in no  acute distress.  HEENT:  Normocephalic and atraumatic.   No scleral icterus.  Pupils  equal, round, and reactive to light.  She has slightly dry mucous  membranes.  NECK:  Supple.  No lymphadenopathy.  No JVD.  LUNGS:  Clear to auscultation bilaterally without wheezes, rhonchi, or  rales.  CARDIOVASCULAR:  Regular rate and rhythm without murmurs, gallops, or  rubs.  ABDOMEN:  Normal bowel sounds, soft, mild epigastric and right upper  quadrant tenderness.  No rebound tenderness.  GU:  Deferred.  RECTAL:  Deferred.  EXTREMITIES:  No clubbing, cyanosis, or edema.  NEUROLOGIC:  Alert and oriented.  Cranial nerves and sensorimotor exam  are intact.  PSYCHIATRIC:  Normal affect.  Calm and cooperative.  SKIN:  No jaundice.  No rash.   LABORATORY DATA:  CBC unremarkable, specifically white blood cell count  is 6400 with 90% neutrophils.  INR is 1.1.  Basic metabolic panel  significant for glucose of 132, bilirubin is 1.7, alkaline phosphatase  215, SGOT 247, SGPT 91, albumin 3.6, calcium 9.1, and lipase 44.  Cardiac enzymes negative.  Urinalysis shows specific gravity 1.019,  small bilirubin, 15 ketones, negative blood, 30 protein, 2 urobilinogen,  negative nitrites, trace leukocyte esterase, 0-2 white cells, and rare  bacteria.  Right upper quadrant ultrasound shows a surgically absent  gallbladder, common duct measuring 9 mm and 10 mm mildly echogenic  shadowing stone in the mid common duct.  Intrahepatic ductal dilatation.  Pancreatic duct at the upper limit of normal at 4 mm.  Right renal cyst.  EKG was reportedly done but is not currently on the chart.   ASSESSMENT AND PLAN:  1. Common duct stone/choledocholithiasis:  Dr. Matthias Hughs was called by      emergency department physician and requested admission to Medicine      and he will consult.  During the last hospitalization, an      endoscopic retrograde cholangiopancreatography was attempted but      unsuccessful.  She was transferred to [facility] and had      successful endoscopic  retrograde cholangiopancreatography.  She is      not therapeutic on Coumadin and this will be held.  As I suspect,      she may need a procedure.  She will be on clear liquids for now and      n.p.o. after midnight.  Cover with empiric antibiotics as      recommended by Dr. Matthias Hughs.  2. Paroxysmal atrial fibrillation, Coumadin subtherapeutic.  3. Degenerative disk disease.  4. History of breast cancer.      Corinna L. Lendell Caprice, MD  Electronically Signed     Corinna L. Lendell Caprice, MD  Electronically Signed    CLS/MEDQ  D:  10/02/2008  T:  10/03/2008  Job:  [id number]   cc:   Donia Guiles, M.D.  James L. Malon Kindle., M.D.

## 2010-08-21 NOTE — H&P (Signed)
Veronica Ellison, Veronica Ellison NO.:  [id number]   MEDICAL RECORD NO.:  [id number]          PATIENT TYPE:  INP   LOCATION:  5501                         FACILITY:  [facility]   PHYSICIAN:  Veronica Ellison, M.D.   DATE OF BIRTH:  Nov 13, 1921   DATE OF ADMISSION:  07/01/2007  DATE OF DISCHARGE:                              HISTORY & PHYSICAL   HISTORY:  Veronica Ellison is an 77 year old female with a history of atrial  fibrillation and common bile duct stone several years ago.  She  describes 3 weeks of fever approximately 100 degrees, nausea and chills  for which she went to see her primary care physician.  Over the course  of that time, he did radiological exams including ultrasound and CT of  her abdomen, pelvis and found that she had a common bile duct stone.  She currently denies pain, vomiting and other upper tract symptoms.  She  does report blood in her urine.  On June 29, 2007, she was found to  have an INR of approximately 14.  She has been on vitamin K orally since  then and today her INR is 9.  Her last ERCP was done Sep 05, 2001 by Dr.  Roosvelt Ellison.  It was a successful CBD stone extraction and  sphincterotomy.   PAST MEDICAL HISTORY:  1. Significant atrial fibrillation on chronic Coumadin.  2. Osteoporosis.  3. History of breast cancer.  She is status post mastectomy.  4. Anemia.  5. Migraines.   SURGERIES:  1. Mastectomy.  2. Hysterectomy.  3. Cholecystectomy.  4. Right knee surgery.  5. Left wrist surgery.   PRIMARY CARE PHYSICIAN:  Veronica Ellison, M.D.   ALLERGIES:  1. AMOXICILLIN.  2. DEMEROL.  3. HYDROCODONE.  4. MACROBID.  5. SULFA.  6. TETRACYCLINE.   CURRENT MEDICATIONS:  1. Amiodarone.  2. Evista.  3. Cartia.  4. Phenergan.  5. Coumadin.  6. Caltrate.  7. Multivitamin.  8. Aspirin 81 mg.   SOCIAL HISTORY:  Negative for tobacco, alcohol and drugs.   FAMILY HISTORY:  Significant for her son who had pancreatic cancer.   PHYSICAL  EXAMINATION:  GENERAL:  She is alert and oriented in no  apparent distress.  HEART:  Currently has a regular rate and rhythm.  ABDOMEN:  Soft, nontender, nondistended, thin with good bowel sounds.   LABORATORY DATA:  On June 30, 2007 shows an AST 177, ALT 151, total  bilirubin 2.3, alk phos 406, hemoglobin 11.1, hematocrit 33.9, white  count 8.0, platelets 273,000.  Her pro-time was 40 and her INR done  today was 9.06.   DIAGNOSTICS:  CT scan of her abdomen on June 30, 2007 showed 12 x 6 mm  stone in the ampulla of her common bile duct as well as pancreatic duct  dilatation.  It also showed constipation.   ASSESSMENT:  Veronica Ellison has seen and examined the patient, collected  a history and reviewed her chart.  His impression is that this is a  generally healthy 77 year old female with a common bile duct stone and  supratherapeutic INR.  We will admit her to our service and plan for an  ERCP tomorrow at approximately 11 a.m. assuming that her INR has been  reduced to approximately 2 or less.      Veronica Police, PA    ______________________________  Veronica Ellison, M.D.    MLY/MEDQ  D:  07/01/2007  T:  07/02/2007  Job:  [id number]   cc:   Veronica Ellison, M.D.  Veronica Ellison., M.D.  Veronica Ellison, M.D.

## 2010-08-21 NOTE — Op Note (Signed)
NAMEBRYSSA, Veronica Ellison NO.:  [id number]   MEDICAL RECORD NO.:  [id number]          PATIENT TYPE:  INP   LOCATION:  5501                         FACILITY:  [facility]   PHYSICIAN:  Graylin Shiver, M.D.   DATE OF BIRTH:  [birth year]   DATE OF PROCEDURE:  07/02/2007  DATE OF DISCHARGE:                               OPERATIVE REPORT   PROCEDURE:  Endoscopic retrograde cholangiopancreatography.   INDICATIONS FOR PROCEDURE:  The patient is an 77 year old female who has  a history of a laparoscopic cholecystectomy in the past.  She also had  choledocholithiasis and in 2003 underwent a sphincterotomy with common  bile duct stone extraction.  She presented with symptoms of pain and  jaundice and some fevers.  A CT scan showed a dilated bile duct and a  distal common bile duct stone.  ERCP is planned to evaluate.   Informed consent was obtained after explanation of the risks of  bleeding, infection, perforation and pancreatitis.   MEDICATIONS GIVEN DURING THE PROCEDURE:  1. Fentanyl 200 mcg IV.  2. Versed 20 mg IV.  3. Glucagon 1.5 mg IV.  4. Robinul 0.2 mg IV.   PROCEDURE:  With the patient in the endoscopy suite lying on her  abdomen, the lateral-viewing duodenoscope was inserted into the  oropharynx and passed into the esophagus.  It was advanced down the  esophagus, then into the stomach and into the duodenum.  The area of the  papilla of Vater was located.  There was no evidence of obvious tumor in  this area.  It did not appear like a typical papilla but it did appear  that a sphincterotomy had been performed and passed.  Cannulation was  attempted for quite awhile of the common bile duct but I was never able  to cannulate the common bile duct using a guidewire and papillotome.  I  could never get a guidewire to advance up the common bile duct.  I  finally was able to get cannulation of the pancreatic duct and made two  injections of contrast into the pancreatic duct,  and it looked normal.  No radiographs were obtained, however, because I could not get into the  common bile duct.  There was able to selectively cannulate the  pancreatic duct.  I advanced a guidewire up the pancreatic duct and  locked it in place.  I then removed the sphincterotome catheter and  loaded another guidewire into the sphincterotome catheter and advanced  it down again.  I then went above the guidewire that was in the  pancreatic duct, attempting to cannulate the common bile duct, but  despite this and multiple attempts I could never get the guidewire to go  up the common bile duct.  I angled up, coursing along what I felt would  have been the sphincterotomy, and attempted over and over again to  cannulate the common bile duct but could not.  There was a duodenal  diverticulum at this level.  After a prolonged procedure and failure to  cannulate the common bile duct, all catheters and wires were  removed.  The procedure was terminated.  She tolerated the procedure well without  obvious complications.   IMPRESSION:  Unable to cannulate the common bile duct despite multiple  attempts and a very prolonged procedure.   PLAN:  I will plan to transfer this patient to [facility] to Dr. Noland Fordyce.           ______________________________  Graylin Shiver, M.D.     SFG/MEDQ  D:  07/02/2007  T:  07/03/2007  Job:  [id number]   cc:   Fayrene Fearing L. Malon Kindle., M.D.  Donia Guiles, M.D.

## 2010-08-21 NOTE — Op Note (Signed)
NAMENOHELANI, BENNING NO.:  [id number]   MEDICAL RECORD NO.:  [id number]          PATIENT TYPE:  INP   LOCATION:  4703                         FACILITY:  [facility]   PHYSICIAN:  John C. Madilyn Fireman, M.D.    DATE OF BIRTH:  May 05, 1921   DATE OF PROCEDURE:  10/03/2008  DATE OF DISCHARGE:                               OPERATIVE REPORT   Endoscopic retrograde cholangiopancreatography with sphincterotomy.   INDICATIONS FOR PROCEDURE:  Common bile duct stone on ultrasound.  The  patient has a history of same with ERCPs in the past with removal of  previous stones.   PROCEDURE:  The patient was placed in the prone position and placed on  the pulse monitor with continuous low-flow oxygen delivered by nasal  cannula.  She was sedated with 100 mcg IV fentanyl and 10 mg IV Versed  and given 0.5 mg IV Glucagon.  The Olympus side-viewing endoscope was  advanced blindly into the oropharynx, esophagus, stomach and the pylorus  was traversed and papilla of Vater located on the medial duodenal wall.  There was a large diverticulum adjacent to it.  There was evidence of  previous sphincterotomy.  The duct was cannulated with the Wilson-Cook  sphincterotome and the sphincterotomy extended.  Then the adjustable 12-  15-mm balloon catheter was used to remove several sludgy stones and  stone fragments.  After multiple balloon sweeps there appeared to be no  further significantly sized stones and there was good bile drainage.  The scope was then withdrawn and the patient returned to the recovery  room in stable condition.  She tolerated the procedure well and there  were no immediate complications.   IMPRESSION:  1. Common bile duct stones removed after enlargement of      sphincterotomy.  2. Duodenal diverticulum.   PLAN:  Will advance diet and recheck liver function test in the morning.           ______________________________  Everardo All. Madilyn Fireman, M.D.     JCH/MEDQ  D:  10/03/2008  T:   10/03/2008  Job:  [id number]

## 2010-08-22 DIAGNOSIS — M549 Dorsalgia, unspecified: Secondary | ICD-10-CM

## 2010-08-22 DIAGNOSIS — C499 Malignant neoplasm of connective and soft tissue, unspecified: Secondary | ICD-10-CM

## 2010-08-22 LAB — URINE MICROSCOPIC-ADD ON

## 2010-08-22 LAB — CARDIAC PANEL(CRET KIN+CKTOT+MB+TROPI)
CK, MB: 1.6 ng/mL (ref 0.3–4.0)
CK, MB: 1.6 ng/mL (ref 0.3–4.0)
Relative Index: INVALID (ref 0.0–2.5)
Relative Index: INVALID (ref 0.0–2.5)
Total CK: 27 U/L (ref 7–177)
Total CK: 28 U/L (ref 7–177)
Troponin I: <0.3 ng/mL (ref <0.30)
Troponin I: <0.3 ng/mL (ref <0.30)

## 2010-08-22 LAB — URINALYSIS, ROUTINE W REFLEX MICROSCOPIC
Nitrite: NEGATIVE
Specific Gravity, Urine: 1.016 (ref 1.005–1.030)
Urobilinogen, UA: 0.2 mg/dL (ref 0.0–1.0)

## 2010-08-22 LAB — CBC
Hemoglobin: 8.3 g/dL — ABNORMAL LOW (ref 12.0–15.0)
MCH: 32.7 pg (ref 26.0–34.0)
RBC: 2.54 MIL/uL — ABNORMAL LOW (ref 3.87–5.11)

## 2010-08-22 LAB — PHOSPHORUS: Phosphorus: 4 mg/dL (ref 2.3–4.6)

## 2010-08-22 LAB — COMPREHENSIVE METABOLIC PANEL
ALT: 10 U/L (ref 0–35)
AST: 15 U/L (ref 0–37)
Albumin: 2 g/dL — ABNORMAL LOW (ref 3.5–5.2)
Alkaline Phosphatase: 138 U/L — ABNORMAL HIGH (ref 39–117)
BUN: 18 mg/dL (ref 6–23)
CO2: 29 mEq/L (ref 19–32)
Calcium: 7.7 mg/dL — ABNORMAL LOW (ref 8.4–10.5)
Chloride: 103 mEq/L (ref 96–112)
Creatinine, Ser: 0.7 mg/dL (ref 0.4–1.2)
GFR calc Af Amer: >60 mL/min (ref >60)
GFR calc non Af Amer: >60 mL/min (ref >60)
Glucose, Bld: 102 mg/dL — ABNORMAL HIGH (ref 70–99)
Potassium: 4.6 mEq/L (ref 3.5–5.1)
Sodium: 137 mEq/L (ref 135–145)
Total Bilirubin: 0.2 mg/dL — ABNORMAL LOW (ref 0.3–1.2)
Total Protein: 4.5 g/dL — ABNORMAL LOW (ref 6.0–8.3)

## 2010-08-23 LAB — CBC
Platelets: 389 10*3/uL (ref 150–400)
RDW: 20.2 % — ABNORMAL HIGH (ref 11.5–15.5)
WBC: 9.2 10*3/uL (ref 4.0–10.5)

## 2010-08-23 LAB — URINE CULTURE
Culture  Setup Time: 201205160857
Special Requests: NEGATIVE

## 2010-08-24 ENCOUNTER — Ambulatory Visit (HOSPITAL_COMMUNITY)
Admission: RE | Admit: 2010-08-24 | Discharge: 2010-08-24 | Disposition: A | Discharge status: Home / Self Care | Payer: Medicare Other | Source: Other Acute Inpatient Hospital | Attending: Internal Medicine | Admitting: Internal Medicine

## 2010-08-24 ENCOUNTER — Other Ambulatory Visit: Payer: Self-pay | Admitting: Interventional Radiology

## 2010-08-24 DIAGNOSIS — M8448XA Pathological fracture, other site, initial encounter for fracture: Secondary | ICD-10-CM | POA: Insufficient documentation

## 2010-08-24 DIAGNOSIS — Z853 Personal history of malignant neoplasm of breast: Secondary | ICD-10-CM | POA: Insufficient documentation

## 2010-08-24 DIAGNOSIS — M810 Age-related osteoporosis without current pathological fracture: Secondary | ICD-10-CM | POA: Insufficient documentation

## 2010-08-24 DIAGNOSIS — M1990 Unspecified osteoarthritis, unspecified site: Secondary | ICD-10-CM | POA: Insufficient documentation

## 2010-08-24 DIAGNOSIS — I10 Essential (primary) hypertension: Secondary | ICD-10-CM | POA: Insufficient documentation

## 2010-08-24 LAB — CBC
HCT: 26.3 % — ABNORMAL LOW (ref 36.0–46.0)
MCHC: 31.9 g/dL (ref 30.0–36.0)
MCV: 100 fL (ref 78.0–100.0)
RDW: 20.1 % — ABNORMAL HIGH (ref 11.5–15.5)
WBC: 8.9 10*3/uL (ref 4.0–10.5)

## 2010-08-24 LAB — BASIC METABOLIC PANEL WITH GFR
BUN: 15 mg/dL (ref 6–23)
CO2: 30 meq/L (ref 19–32)
Calcium: 8.1 mg/dL — ABNORMAL LOW (ref 8.4–10.5)
Chloride: 101 meq/L (ref 96–112)
Creatinine, Ser: 0.69 mg/dL (ref 0.4–1.2)
GFR calc non Af Amer: >60 mL/min
Glucose, Bld: 96 mg/dL (ref 70–99)
Potassium: 3.8 meq/L (ref 3.5–5.1)
Sodium: 137 meq/L (ref 135–145)

## 2010-08-24 MED ORDER — IOHEXOL 300 MG/ML  SOLN
50.0000 mL | Freq: Once | INTRAMUSCULAR | Status: AC | PRN
  Administered 2010-08-24: 20 mL

## 2010-08-24 NOTE — Discharge Summary (Signed)
[name], [name]                ACCOUNT NO.:  [id number]   MEDICAL RECORD NO.:  [id number]          PATIENT TYPE:  INP   LOCATION:  4703                         FACILITY:  [facility]   PHYSICIAN:  Corinna L. Lendell Caprice, MDDATE OF BIRTH:  1922/03/07   DATE OF ADMISSION:  10/02/2008  DATE OF DISCHARGE:  10/05/2008                               DISCHARGE SUMMARY   DISCHARGE DIAGNOSES:  1. Choledocholithiasis, recurrent, with cholangitis.  2. Status post endoscopic retrograde cholangiopancreatography with      enlargement of sphincterotomy and removal of common duct stones.  3. Atrial fibrillation.  4. Degenerative disk disease.  5. Status post cholecystectomy.  6. History of breast cancer, status post left mastectomy.  7. Left cerumen impaction.  8. Debility, status post fall and occipital contusion.   DISCHARGE MEDICATIONS:  1. Stop diltiazem.  2. Continue amlodipine 200 mg a day.  3. Coumadin as previous.  4. Prilosec as previous.  5. Resume diltiazem per Dr. Chancy Milroy orders, half dose on Friday,      full dose on Tuesday.  6. Zofran 4 mg every 8 hours as needed for nausea.   FOLLOW UP:  1. Follow up with Dr. Arvilla Market in 2 weeks.  2. Follow up with Dr. Evette Cristal in 2 weeks.   ACTIVITY:  Increase slowly.   CONSULTATIONS:  [facility].   PROCEDURES:  See above.   DIET:  Should be as tolerated.   CONDITION:  Stable.   LABORATORY DATA:  CBC on admission unremarkable.  Her white count jumped  to 17,000 and at discharge is 7000.  Hemoglobin on admission normal and  on October 03, 2008, was 10.5.  Hematocrit on admission 3.6 and on September 25, 2008, 30.3.  Platelet count normal.  INR on admission was 1.1.  Basic  metabolic panel significant for glucose of 132, otherwise unremarkable.  Liver function tests on admission significant for AST of 242, ALT of 91,  alkaline phosphatase 215, total bilirubin 1.7, lipase 44.  At discharge,  her AST had decreased to 63, ALT had decreased to 134,  total bilirubin  1.3, alkaline phosphatase 188.  Urinalysis showed small bilirubin, 15  ketones, 30 protein, 2 urobilinogen, negative nitrite, trace leukocyte  esterase, 0-2 white cells, rare bacteria.  Point-of-care enzymes  negative.   SPECIAL STUDIES/RADIOLOGY:  Ultrasound of the abdomen showed 10-mm mid  common duct stone similar to findings on prior CAT scan in 2009, stable  intrahepatic ductal dilatation.   HISTORY AND HOSPITAL COURSE:  [name] is a pleasant 77 year old white  female with history of common duct stone in the past who presented with  abdominal pain and nausea.  Please see admission details.  She had  previously required ERCP and transfer to [facility] for stone removal due  to unsuccessful ERCP here.  She was admitted to the Medical Service, per  GI request.  They subsequently consulted and performed successful ERCP  and widening of sphincterotomy and stone removal.  The patient had been  started initially on antibiotics for cholangitis.  The patient's  symptoms improved and at the time of discharge, she  was feeling better  and had been cleared by GI for discharge and outpatient followup.  The  patient did have a fall and occipital contusion.  Physical Therapy,  Occupational Therapy was consulted and fall precautions were continued.  She was noted to have a left cerumen impaction and received Debrox ear  drops.  Also, her Cardizem was held to rule out any orthostatic  component.  She was discharged by Dr. Rito Ehrlich on October 05, 2008 in  stable condition.      Corinna L. Lendell Caprice, MD  Electronically Signed     Corinna L. Lendell Caprice, MD  Electronically Signed    CLS/MEDQ  D:  11/17/2008  T:  11/17/2008  Job:  [id number]

## 2010-08-24 NOTE — Procedures (Signed)
[place]. [facility]  Patient:    Veronica Ellison, Veronica Ellison Visit Number: [id number] MRN: [id number]          Service Type: MED Location: [id number] Attending Physician:  Jackie Plum Dictated by:   Roosvelt Harps, M.D. Proc. Date: 09/05/01 Admit Date:  09/03/2001   CC:         Gita Kudo, M.D.   Procedure Report  PROCEDURE:  Endoscopic retrograde cholangiopancreatography with sphincterotomy and balloon catheter stone debris extraction.  INDICATION:  A 77 year old female with abdominal pain, CT scan evidence of dilated ducts with a distal common bile duct stone, and minimal resolving pancreatitis.  PREPARATION:  She is NPO and has been on Unasyn.  PREPROCEDURE SEDATION:  She received only 5 mg of Versed intravenously.  In addition, during the course of the procedure 1 mg of Glucagon was given intravenously to relax the small bowel.  DESCRIPTION OF PROCEDURE:  The Olympus video duodenoscope was inserted by the mouth and advanced easily to the stomach.  Intubation was then carried out of the duodenum.  The scope was retroflexed and withdrawn, and the ampulla was easily identified.  Multiple attempts at cannulating the common bile duct with the sphincterotome preloaded with the guidewire were successful only in intubating the pancreatic duct.  Several brief injections of the pancreatic duct were made.  There was no filling of secondary radicles, and there was no evidence of pancreatic duct abnormality.  A wisp of contrast did enter the common bile duct.  The catheter was exchanged for a tapered-tip catheter, and this was easily successfully passed into the common bile duct.  A 0.025 Jagwire was inserted into the right hepatic duct.  A moderate sphincterotomy was performed uneventfullly and a balloon catheter exchanged with an 8.5 mm balloon was undertaken.  The balloon catheter was advanced to the ductal bifurcation and inflated.  It was  withdrawn through the CBD to the duodenum twice easily with minimal delivery of sand-like stone debris.  Films of the common bile duct were taken with the radiologist in attendance, who felt there was no residual stone present.  The patient tolerated the procedure well. Pulse, blood pressure, and oximetry testing on supplemental oxygen were stable.  There were no complications noted, although the procedure was technically quite difficult due to extreme respiratory excursions of the duodenum along with bilateral periampullary diverticula.  The patient was observed in recovery for one hour and discharged back to her room, where I reassessed her at 2 p.m.  She was easily arousable, complaining of minimal epigastric discomfort but no real pain.  Abdomen was soft with normal bowel sounds and minimal tenderness in the epigastrium, much as in her preprocedure exam.  IMPRESSION:  Successful endoscopic retrograde cholangiopancreatography with sphincterotomy and delivery of distal common bile duct debris.  PLAN:  The patient will be observed and placed on clear liquids in contemplation of cholecystectomy within the next 48 hours by Dr. Maryagnes Amos. Dictated by:   Roosvelt Harps, M.D. Attending Physician:  Jackie Plum DD:  09/05/01 TD:  09/07/01 Job: [id number]

## 2010-08-25 LAB — BASIC METABOLIC PANEL
BUN: 20 mg/dL (ref 6–23)
Calcium: 8.7 mg/dL (ref 8.4–10.5)
Creatinine, Ser: 0.78 mg/dL (ref 0.4–1.2)
GFR calc non Af Amer: >60 mL/min (ref >60)
Glucose, Bld: 100 mg/dL — ABNORMAL HIGH (ref 70–99)
Sodium: 137 mEq/L (ref 135–145)

## 2010-08-25 LAB — CBC
HCT: 26.2 % — ABNORMAL LOW (ref 36.0–46.0)
MCH: 32.9 pg (ref 26.0–34.0)
MCHC: 32.4 g/dL (ref 30.0–36.0)
RDW: 20.3 % — ABNORMAL HIGH (ref 11.5–15.5)

## 2010-08-26 LAB — CBC
MCHC: 32.1 g/dL (ref 30.0–36.0)
Platelets: 372 10*3/uL (ref 150–400)
RDW: 20 % — ABNORMAL HIGH (ref 11.5–15.5)
WBC: 9 10*3/uL (ref 4.0–10.5)

## 2010-08-26 LAB — DIFFERENTIAL
Basophils Absolute: 0.1 10*3/uL (ref 0.0–0.1)
Basophils Relative: 1 % (ref 0–1)
Eosinophils Absolute: 0.4 10*3/uL (ref 0.0–0.7)
Eosinophils Relative: 5 % (ref 0–5)
Lymphocytes Relative: 8 % — ABNORMAL LOW (ref 12–46)
Monocytes Absolute: 1.3 10*3/uL — ABNORMAL HIGH (ref 0.1–1.0)

## 2010-08-27 LAB — CBC
Hemoglobin: 8.5 g/dL — ABNORMAL LOW (ref 12.0–15.0)
Platelets: 347 10*3/uL (ref 150–400)
RBC: 2.63 MIL/uL — ABNORMAL LOW (ref 3.87–5.11)
WBC: 7.1 10*3/uL (ref 4.0–10.5)

## 2010-09-05 ENCOUNTER — Ambulatory Visit
Admit: 2010-09-05 | Discharge: 2010-09-05 | Disposition: A | Discharge status: Home / Self Care | Payer: Medicare Other | Attending: Radiation Oncology | Admitting: Radiation Oncology

## 2010-09-11 ENCOUNTER — Encounter (HOSPITAL_BASED_OUTPATIENT_CLINIC_OR_DEPARTMENT_OTHER): Payer: Medicare Other | Admitting: Oncology

## 2010-09-11 ENCOUNTER — Other Ambulatory Visit: Payer: Self-pay | Admitting: Oncology

## 2010-09-11 DIAGNOSIS — C496 Malignant neoplasm of connective and soft tissue of trunk, unspecified: Secondary | ICD-10-CM

## 2010-09-11 DIAGNOSIS — D61818 Other pancytopenia: Secondary | ICD-10-CM

## 2010-09-11 DIAGNOSIS — B370 Candidal stomatitis: Secondary | ICD-10-CM

## 2010-09-11 DIAGNOSIS — M810 Age-related osteoporosis without current pathological fracture: Secondary | ICD-10-CM

## 2010-09-11 DIAGNOSIS — Z5189 Encounter for other specified aftercare: Secondary | ICD-10-CM

## 2010-09-11 DIAGNOSIS — C499 Malignant neoplasm of connective and soft tissue, unspecified: Secondary | ICD-10-CM

## 2010-09-11 DIAGNOSIS — E46 Unspecified protein-calorie malnutrition: Secondary | ICD-10-CM

## 2010-09-11 DIAGNOSIS — T451X5A Adverse effect of antineoplastic and immunosuppressive drugs, initial encounter: Secondary | ICD-10-CM

## 2010-09-11 LAB — CBC WITH DIFFERENTIAL/PLATELET
BASO%: 0.2 % (ref 0.0–2.0)
Eosinophils Absolute: 0.2 10*3/uL (ref 0.0–0.5)
HCT: 33.3 % — ABNORMAL LOW (ref 34.8–46.6)
LYMPH%: 10.3 % — ABNORMAL LOW (ref 14.0–49.7)
MCHC: 32.7 g/dL (ref 31.5–36.0)
MONO#: 0.5 10*3/uL (ref 0.1–0.9)
NEUT#: 5 10*3/uL (ref 1.5–6.5)
NEUT%: 78.9 % — ABNORMAL HIGH (ref 38.4–76.8)
Platelets: 258 10*3/uL (ref 145–400)
RBC: 3.23 10*6/uL — ABNORMAL LOW (ref 3.70–5.45)
WBC: 6.3 10*3/uL (ref 3.9–10.3)
lymph#: 0.6 10*3/uL — ABNORMAL LOW (ref 0.9–3.3)

## 2010-09-11 LAB — COMPREHENSIVE METABOLIC PANEL
ALT: 14 U/L (ref 0–35)
CO2: 27 mEq/L (ref 19–32)
Calcium: 9 mg/dL (ref 8.4–10.5)
Chloride: 104 mEq/L (ref 96–112)
Glucose, Bld: 85 mg/dL (ref 70–99)
Sodium: 140 mEq/L (ref 135–145)
Total Bilirubin: 0.4 mg/dL (ref 0.3–1.2)
Total Protein: 6 g/dL (ref 6.0–8.3)

## 2010-09-12 ENCOUNTER — Other Ambulatory Visit (HOSPITAL_COMMUNITY): Payer: Self-pay | Admitting: Interventional Radiology

## 2010-09-12 DIAGNOSIS — M549 Dorsalgia, unspecified: Secondary | ICD-10-CM

## 2010-09-17 ENCOUNTER — Ambulatory Visit (HOSPITAL_COMMUNITY)
Admission: RE | Admit: 2010-09-17 | Discharge: 2010-09-17 | Disposition: A | Discharge status: Home / Self Care | Payer: Medicare Other | Source: Ambulatory Visit | Attending: Interventional Radiology | Admitting: Interventional Radiology

## 2010-09-17 ENCOUNTER — Other Ambulatory Visit (HOSPITAL_COMMUNITY): Payer: Self-pay | Admitting: Interventional Radiology

## 2010-09-17 ENCOUNTER — Ambulatory Visit (HOSPITAL_COMMUNITY): Payer: Medicare Other

## 2010-09-17 DIAGNOSIS — M549 Dorsalgia, unspecified: Secondary | ICD-10-CM | POA: Insufficient documentation

## 2010-09-17 DIAGNOSIS — M47817 Spondylosis without myelopathy or radiculopathy, lumbosacral region: Secondary | ICD-10-CM | POA: Insufficient documentation

## 2010-09-17 DIAGNOSIS — M48061 Spinal stenosis, lumbar region without neurogenic claudication: Secondary | ICD-10-CM | POA: Insufficient documentation

## 2010-09-17 DIAGNOSIS — Z09 Encounter for follow-up examination after completed treatment for conditions other than malignant neoplasm: Secondary | ICD-10-CM | POA: Insufficient documentation

## 2010-09-17 DIAGNOSIS — M8448XA Pathological fracture, other site, initial encounter for fracture: Secondary | ICD-10-CM | POA: Insufficient documentation

## 2010-09-17 DIAGNOSIS — M4120 Other idiopathic scoliosis, site unspecified: Secondary | ICD-10-CM | POA: Insufficient documentation

## 2010-09-18 ENCOUNTER — Other Ambulatory Visit (HOSPITAL_COMMUNITY): Payer: Self-pay | Admitting: Interventional Radiology

## 2010-09-18 DIAGNOSIS — S32000A Wedge compression fracture of unspecified lumbar vertebra, initial encounter for closed fracture: Secondary | ICD-10-CM

## 2010-09-20 ENCOUNTER — Other Ambulatory Visit (HOSPITAL_COMMUNITY): Payer: Medicare Other

## 2010-09-21 ENCOUNTER — Ambulatory Visit (HOSPITAL_COMMUNITY): Admission: RE | Admit: 2010-09-21 | Payer: Medicare Other | Source: Ambulatory Visit

## 2010-09-25 ENCOUNTER — Ambulatory Visit (HOSPITAL_COMMUNITY): Payer: Medicare Other

## 2010-09-26 ENCOUNTER — Observation Stay (HOSPITAL_COMMUNITY)
Admission: AD | Admit: 2010-09-26 | Discharge: 2010-10-01 | Disposition: A | Discharge status: Home / Self Care | Payer: Medicare Other | Source: Ambulatory Visit | Attending: Internal Medicine | Admitting: Internal Medicine

## 2010-09-26 DIAGNOSIS — C412 Malignant neoplasm of vertebral column: Secondary | ICD-10-CM | POA: Insufficient documentation

## 2010-09-26 DIAGNOSIS — Z79899 Other long term (current) drug therapy: Secondary | ICD-10-CM | POA: Insufficient documentation

## 2010-09-26 DIAGNOSIS — I10 Essential (primary) hypertension: Secondary | ICD-10-CM | POA: Insufficient documentation

## 2010-09-26 DIAGNOSIS — M1990 Unspecified osteoarthritis, unspecified site: Secondary | ICD-10-CM | POA: Insufficient documentation

## 2010-09-26 DIAGNOSIS — Z8673 Personal history of transient ischemic attack (TIA), and cerebral infarction without residual deficits: Secondary | ICD-10-CM | POA: Insufficient documentation

## 2010-09-26 DIAGNOSIS — C493 Malignant neoplasm of connective and soft tissue of thorax: Secondary | ICD-10-CM | POA: Insufficient documentation

## 2010-09-26 DIAGNOSIS — K589 Irritable bowel syndrome without diarrhea: Secondary | ICD-10-CM | POA: Insufficient documentation

## 2010-09-26 DIAGNOSIS — Z853 Personal history of malignant neoplasm of breast: Secondary | ICD-10-CM | POA: Insufficient documentation

## 2010-09-26 DIAGNOSIS — M8448XA Pathological fracture, other site, initial encounter for fracture: Principal | ICD-10-CM | POA: Insufficient documentation

## 2010-09-26 DIAGNOSIS — M549 Dorsalgia, unspecified: Secondary | ICD-10-CM | POA: Insufficient documentation

## 2010-09-26 DIAGNOSIS — Z9181 History of falling: Secondary | ICD-10-CM | POA: Insufficient documentation

## 2010-09-26 DIAGNOSIS — M810 Age-related osteoporosis without current pathological fracture: Secondary | ICD-10-CM | POA: Insufficient documentation

## 2010-09-26 DIAGNOSIS — D649 Anemia, unspecified: Secondary | ICD-10-CM | POA: Insufficient documentation

## 2010-09-26 DIAGNOSIS — I4891 Unspecified atrial fibrillation: Secondary | ICD-10-CM | POA: Insufficient documentation

## 2010-09-26 DIAGNOSIS — Z01812 Encounter for preprocedural laboratory examination: Secondary | ICD-10-CM | POA: Insufficient documentation

## 2010-09-26 LAB — DIFFERENTIAL
Basophils Absolute: 0 10*3/uL (ref 0.0–0.1)
Basophils Relative: 0 % (ref 0–1)
Eosinophils Absolute: 0.2 10*3/uL (ref 0.0–0.7)
Monocytes Relative: 7 % (ref 3–12)
Neutro Abs: 3.7 10*3/uL (ref 1.7–7.7)
Neutrophils Relative %: 70 % (ref 43–77)

## 2010-09-26 LAB — BASIC METABOLIC PANEL
CO2: 26 mEq/L (ref 19–32)
Calcium: 8.9 mg/dL (ref 8.4–10.5)
GFR calc non Af Amer: >60 mL/min (ref >60)
Potassium: 3.8 mEq/L (ref 3.5–5.1)
Sodium: 139 mEq/L (ref 135–145)

## 2010-09-26 LAB — CBC
Hemoglobin: 10.6 g/dL — ABNORMAL LOW (ref 12.0–15.0)
MCV: 100 fL (ref 78.0–100.0)
Platelets: 259 10*3/uL (ref 150–400)
RBC: 3.16 MIL/uL — ABNORMAL LOW (ref 3.87–5.11)
WBC: 5.3 10*3/uL (ref 4.0–10.5)

## 2010-09-26 LAB — APTT: aPTT: 38 seconds — ABNORMAL HIGH (ref 24–37)

## 2010-09-27 ENCOUNTER — Inpatient Hospital Stay (HOSPITAL_COMMUNITY): Payer: Medicare Other

## 2010-09-28 NOTE — Discharge Summary (Signed)
Veronica Ellison, GOAR NO.:  [id number]  MEDICAL RECORD NO.:  [id number]           PATIENT TYPE:  I  LOCATION:  1510                         FACILITY:  [facility]  PHYSICIAN:  Veronica Scott, MD     DATE OF BIRTH:  07-29-1921  DATE OF ADMISSION:  08/21/2010 DATE OF DISCHARGE:  08/28/2010                        DISCHARGE SUMMARY - REFERRING   PRIMARY CARE PHYSICIAN:  Veronica Raider, MD  ONCOLOGIST:  Veronica Parody, MD  CARDIOLOGIST:  Veronica Crafts, MD  DISCHARGE DIAGNOSES: 1. Low back and left hip pain, status post fall. 2. Acute L2 vertebral compression fracture, status post kyphoplasty. 3. Recurrent soft tissue sarcoma, right posterior chest wall. 4. Anemia secondary to recent chemotherapy versus inflammation. 5. History of cerebrovascular accident. 6. Atrial fibrillation with controlled ventricular rate. 7. Episodes of asymptomatic nonsustained ventricular tachycardia. 8. Hypertension. 9. Presyncope versus syncope, possibly secondary to medication that is     Ambien. 10.Orthostatic hypotension, improved versus resolved. 11.History of vertigo. 12.History of left breast cancer, status post surgery and     reconstruction.  DISCHARGE MEDICATIONS: 1. Tramadol 50 mg p.o. q.i.d. 2. Percocet 5/325, 1 to 2 tablets p.o. q.6 h. p.r.n. pain. 3. Amiodarone 200 mg p.o. on Sundays, Tuesdays, Thursdays, and     Saturdays; and 100 mg p.o. on Mondays, Wednesdays, and Fridays. 4. Enteric-coated aspirin 325 mg p.o. q.p.m. 5. Calcium carbonate/vitamin D 600 mg/400 mg, 1 tablet p.o. b.i.d. 6. Clotrimazole troche 10 mg p.o. t.i.d. 7. Compazine 10 mg p.o. q.6 h. p.r.n. for nausea or vomiting. 8. Fosamax 70 mg p.o. weekly. 9. Imodium 2 mg p.o. q.8 h. p.r.n. for diarrhea. 10.Magic mouthwash 1 teaspoon p.o. q.i.d. 11.Magnesium oxide 400 mg p.o. b.i.d. 12.Multivitamins 1 tablet p.o. daily. 13.Mupirocin topical 2%, one application topically to the leg 3 times     a day p.r.n.  through Sep 03, 2010. 14.Nitroglycerin 0.4 mg sublingual p.r.n. for chest pain.  May use q.5     minutes up to a maximum of 3 doses. 15.Omeprazole 20 mg p.o. daily. 16.Ondansetron ODT 4 mg tablet, 1 to 2 tablets sublingual q.8 h.     p.r.n. for nausea or vomiting. 17.Restasis ophthalmic 1 drop in both eyes b.i.d.  DISCONTINUED MEDICATION:  Ambien.  PROCEDURE:  Lumbar kyphoplasty at L2 performed on Aug 27, 2010, by Dr. Julieanne Ellison.  IMAGING: 1. Chest x-ray, May 15th, impression:     a.     Unchanged support apparatus.     b.     Stable mild cardiomegaly.     c.     No acute cardiopulmonary disease. 2. X-ray of the pelvis.  No acute osseous abnormality. 3. CT of the lumbar spine without contrast.  Impression:     a.     Acute or subacute superior endplate compression fracture at      L2 with mild osseous retropulsion.     b.     Mild superior endplate compression fracture at L1 and L5 are      unchanged from prior exam 10 weeks ago.     c.     Diffuse spondylosis with resulting  multilevel spinal      stenosis. 4. CT of the left hip without contrast.  Impression:  No CT findings     for occult left hip or left pubic ramus fracture. 5. X-ray of the left femur.  Impression:  No acute abnormality.  LABORATORY DATA:  CBC, yesterday, hemoglobin 8.5, hematocrit 27, MCV 102, white blood cell 7.1, and platelets 347.  Basic metabolic panel, unremarkable, except glucose 100.  BUN was 20, creatinine 0.78, and potassium was 4.6.  Urine culture 45,000 colonies per milliliter, suggestive of contamination.  Cardiac enzymes were cycled and negative. Phosphorus 4 and magnesium 2.1.  Hepatic panel only significant for alkaline phosphatase 138, total protein 4.5, and albumin 2.  Urinalysis with 0 to 2 white blood cells. Coagulation indices are within normal limits.  CONSULTATIONS: 1. Interventional Radiology, Veronica Cotton, MD 2. Phone consultation with Dr. Lance Ellison from  Cardiology.  DIET:  Heart-healthy diet with thin liquids.  ACTIVITIES:  Out of bed with assistance.  Increase activity slowly.  No lifting for 2 weeks.  No driving for 2 weeks.  No bending, stooping, or twisting for 2 weeks.  COMPLAINTS:  The patient indicates that she still has left-sided hip pain, but is better than prior to admission.  She has minimal low back pain.  Yesterday, she did ambulate with the help of the physical therapist with no dizziness.  PHYSICAL EXAMINATION:  GENERAL:  Veronica Ellison is in no obvious distress. VITAL SIGNS:  Blood pressure is 112/72 mmHg on the right arm and 120/65 mmHg on the left arm, temperature 98.1 degrees Fahrenheit, pulse 82 per minute, respiration 18 per minute, and saturating at 92% on room air. Her orthostatic blood pressures yesterday were normal. RESPIRATORY SYSTEM:  Clear. CARDIOVASCULAR SYSTEM:  First and second heart sounds heard, regular. No murmurs or JVD.  Telemetry shows sinus rhythm with PVCs occasionally. She has had a couple of runs of nonsustained ventricular tachycardia with last one of 10 beats today, which was asymptomatic. ABDOMEN:  Soft, nondistended, and nontender.  Bowel sounds are present. CENTRAL NERVOUS SYSTEM:  The patient is awake, alert, and oriented x3 with no focal neurological deficit.  HOSPITAL COURSE:  Veronica Ellison is an 77 year old female patient with extensive medical problems including paroxysmal atrial fibrillation, not on Coumadin, thalamic cerebrovascular accident with history of vertigo, recurrent soft tissue sarcoma of the right posterior chest wall, hypertension, irritable bowel syndrome, breast cancer status post surgery and reconstruction, who presented with left hip and low back pain.  She apparently sustained this after she had taken Ambien the night before.  She felt dizzy, the next morning stumbled, and she experienced a fall in the bathroom.  She cannot recollect if she lost consciousness or  not.  She was evaluated by her primary MD where x-rays revealed small fracture of L2.  She was initially controlled with the pain medications, but subsequently she started having low back pain and left hip pain, which was not controlled.  She thereby presented to the emergency room.  1. Low back and left hip pain.  Evaluation revealed vertebral fracture     of L2.  Physical Therapy was consulted.  Pain medications were     provided, but despite this her pain was not controlled.  Her left     hip pain was thought to be either secondary to radiation from her     L2 fracture versus localized pain from trauma to the left hip,     although there were no  fractures on CT of the left hip.     Eventually, Interventional Radiology was consulted and they     performed L2 vertebral kyphoplasty.  Since then, the patient     complains of minimal low back pain, which is better, but continues     to have left hip pain, which may be because of the direct trauma     and should continue to improve with increasing activity.  However,     if the left hip pain persists or worsens, then consider an     orthopedic evaluation as an outpatient.  At this time, the patient     is weak and deconditioned, and is accepted at skilled nursing     facility bed for rehab. 2. Mechanical fall.  Unclear if this was secondary to the side effect     of the Ambien.  This is discontinued.  She does have history of     vertigo and we are not sure if she syncopized or not.  She has a     recent echocardiogram in March with normal left ventricular     ejection fraction and recent carotid Dopplers in December 2011 did     not show any extracranial carotid artery stenosis.  Again, continue     physical therapy and occupational therapy evaluation. 3. Atrial fibrillation.  The patient has been in sinus rhythm.  She     has not been on Coumadin probably from a fall risk.  She is on     aspirin.  She is continued on amiodarone. 4.  Episodes of nonsustained ventricular tachycardia, asymptomatic.     This dictator discussed with Dr. Eldridge Dace on multiple occasions and     it was determined that since her echo has normal LV function, she     is asymptomatic and she has these episodes of orthostatic     hypotension, it would best be advised to continue to monitor her     without any further additional medications.  He indicates that she     may be having these episodes, but are only detected because she has     been on the tele monitor. 5. 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     The patient is status post 2 out of 5 cycles of adjuvant     chemotherapy with Cytoxan and Adriamycin.  The oncologist did     consult on her in the hospital and recommended to hold chemotherapy     for the next 2 to 3 weeks. 6. Anemia, which is secondary to recent chemotherapy versus     inflammation.  The anemia workup in April 2012 as an outpatient was     apparently negative.  She is to be transfused p.r.n. if her     hemoglobin is less than 7.  It was suggested that maybe a unit of     blood transfusion might help her strength as well as orthostatic     hypotension, but the patient was not keen on that and hence she has     not been transfused. 7. History of CVA.  Continue aspirin. 8. Hypertension.  Management as above.  Orthostatic hypotension seems     to have resolved or improved. 9. Transient hypoxia on admission of undetermined etiology, but her     oxygen saturations have been normal and she has not complained of     any dyspnea or chest pain.  FOLLOWUP RECOMMENDATIONS:  1. With Dr. Julieanne Ellison.  The nursing facilities to call [phone] for a followup appointment for the patient to be seen by     him in 2 weeks. 2. With Dr. Jethro Bolus.  The patient has an appointment to be seen in     early June 2012. 3. With Dr. Maryfrances Bunnell.  To call for an appointment. 4. To follow up with Dr. Lance Ellison.  To call for an     appointment.  Time taken in coordinating this discharge is 45 minutes.     Veronica Scott, MD     AH/MEDQ  D:  08/28/2010  T:  08/28/2010  Job:  [id number]  cc:   Veronica Ellison, M.D. Fax: [phone]  Veronica Ellison, M.D.  Veronica Crafts, MD Fax: [phone]  [place] Silos. Corliss Skains, M.D. Fax: [phone]  Electronically Signed by Veronica Scott MD on 09/28/2010 10:09:33 PM

## 2010-09-30 ENCOUNTER — Observation Stay (HOSPITAL_COMMUNITY): Payer: Medicare Other

## 2010-10-01 ENCOUNTER — Other Ambulatory Visit: Payer: Self-pay | Admitting: Interventional Radiology

## 2010-10-01 ENCOUNTER — Observation Stay (HOSPITAL_COMMUNITY): Payer: Medicare Other

## 2010-10-01 LAB — BASIC METABOLIC PANEL
CO2: 27 mEq/L (ref 19–32)
Calcium: 7.5 mg/dL — ABNORMAL LOW (ref 8.4–10.5)
GFR calc non Af Amer: >60 mL/min (ref >60)
Sodium: 142 mEq/L (ref 135–145)

## 2010-10-01 LAB — MRSA PCR SCREENING: MRSA by PCR: NEGATIVE

## 2010-10-01 NOTE — H&P (Signed)
NAMEMONAI, HINDES NO.:  [id number]  MEDICAL RECORD NO.:  [id number]  LOCATION:  3025                         FACILITY:  [facility]  PHYSICIAN:  Jeoffrey Massed, MD    DATE OF BIRTH:  1921-08-16  DATE OF ADMISSION:  09/26/2010 DATE OF DISCHARGE:                             HISTORY & PHYSICAL   PRIMARY CARE PRACTITIONER:  Elsworth Soho, MD  CHIEF COMPLAINT:  Intractable back pain.  HISTORY OF PRESENT ILLNESS:  The patient is an 77 year old female with a past medical history of chronic atrial fibrillation on amiodarone and not a Coumadin candidate per her cardiologist, history of recurrent sarcoma of right chest wall, last chemo a couple of months ago, who was just recently discharged from our facility in May 2012, after she had an L2 vertebral compression fracture and then received kyphoplasty and was discharged to a skilled nursing facility for rehab, following which she is now home.  She claims that she still continues to have intractable pain.  The patient claims that the pain is around 8/10 at its severity, is sharp in nature, and mostly located in the low back area and a left hip region.  She is still using a regular narcotics at home.  There is no radiation of the pain.  There is no fever or any history of urinary or fecal incontinence.  She has intact sensation and does not have any weakness in her lower extremity.  Pain is aggravated by any sort of movement and ambulation.  She denies any chest pain, shortness of breath, nausea, vomiting, diarrhea, or headaches.  ALLERGIES:  The patient is allergic to MACRODANTIN, TETRACYCLINE, SULFA MEDICATIONS, DEMEROL, AMOXICILLIN, HYDROCODONE, MORPHINE, MEPERIDINE, AMPICILLIN, and ADHESIVES.  PAST MEDICAL HISTORY: 1. Hypertension. 2. Atrial fibrillation. 3. Gastroesophageal reflux disease. 4. Interval bowel syndrome. 5. Osteopenia. 6. Osteoarthritis. 7. 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 Dayton Scrape     of Radiation Oncology. 8. History of breast cancer status post mastectomy in the 1990s. 9. History of CVA.  MEDICATIONS AT HOME:  Include the following: 1. Tramadol 50 mg 1 tablet p.o. 4 times a day p.r.n. 2. Percocet 5/325 one tablet p.o. twice daily. 3. Systane 0.4-0.3% solution in both eyes daily p.r.n. 4. Lomotil 2.5/0.025 mg 1 tablet p.o. 4 times a day. 5. Imodium 2 mg 1-2 tablets p.o. q.8 hours p.r.n. 6. Zofran 4 mg 1 tablet every 8 hours p.r.n. 7. Nystatin topical 1 application 3 times a day p.r.n. 8. Nitroglycerin sublingually 0.4 mg 1 tablet every 5 minutes up to 3     doses p.r.n. 9. Compazine 10 mg 1 tablet p.o. q.6 hours p.r.n. 10.Calcium carbonate/vitamin D 600 mg/200 mg 1 tablet twice daily. 11.Magic mouth wash 1 teaspoon q.5 hours p.r.n. 12.Benadryl topical 0.5 mg 1 application twice daily p.r.n. 13.Multivitamins 1 tablet daily. 14.Magnesium oxide 400 mg 1 tablet twice daily. 15.Aspirin 325 mg 1 tablet every evening. 16.Alendronate 70 mg 1 tablet weekly. 17.Omeprazole 20 mg 1 capsule every morning. 18.Amiodarone 200 mg 0.5-1 tablet daily, alternating between 1/2-1     tablet every day.  PAST SURGICAL HISTORY: 1. Status post hysterectomy and status post cholecystectomy. 2. Status  post mastectomy and reconstruction.  FAMILY HISTORY:  Father died from heart disease.  Her mother also died from heart disease.  One of her brothers had a prostate cancer.  REVIEW OF SYSTEMS:  The data review of 12 systems were done and these were negative except for the ones mentioned in the HPI.  PHYSICAL EXAMINATION:  GENERAL:  The patient is lying in bed, does not appear to be in any distress. HEENT:  Atraumatic, normocephalic.  Pupils equal and reactive to light and accommodation. NECK:  Supple.  No JVD. CHEST:  Bilaterally clear to auscultation. CARDIOVASCULAR:  Heart sounds are regular.  No murmurs heard. ABDOMEN:  Soft, nontender,  nondistended. EXTREMITIES:  No edema. NEUROLOGIC:  The patient is awake and alert and does not have any focal neurological deficits.  LABORATORY DATA:  Laboratory data so far CBC shows WBC of 5.3, hemoglobin of 10.6, hematocrit of 31.6, and a platelet count of 259. Chemistries and coagulation profile are pending.  PERTINENT RADIOLOGICAL STUDIES:  A recent MRI of the spine done on September 18, 2010, showed a mild/developing L3 compression fracture.  Edema is involving the superior endplate.  No significant loss of height or bony retropulsion.  L2 compression fracture status post augmentation with no adverse features.  Widespread lumbar spondylosis.  Grade 1 L5-S1 spondylolisthesis.  Multilevel degenerative lumbar spinal stenosis.  An addendum to the report also states that images show marrow edema of the left sacral ala at the S1 level.  ASSESSMENT: 1. Intractable low back pain, probably secondary to developing     compression fractures. 2. Left-sided low back and hip pain, possibly secondary to marrow     edema of the left sacral ala of unknown etiology. 3. Chronic atrial fibrillation. 4. Status post recent L2 compression fracture with status post  kyphoplasty.  PLAN: 1. This patient will be brought in as an observation.  We will give     her IV Dilaudid for intractable pain and we will continue her     Percocet and Ultram for pain. 2. We will obtain a Physical Therapy consultation. 3. We will reconsult Interventional Radiology, Dr. Corliss Skains, for     further recommendations to see if the patient in fact need a repeat     kyphoplasty. 4. DVT prophylaxis with Lovenox. 5. Code status.  Full code.  TOTAL TIME SPENT:  Equals 45 minutes.     Jeoffrey Massed, MD     SG/MEDQ  D:  09/26/2010  T:  09/26/2010  Job:  [id number]  Electronically Signed by Jeoffrey Massed  on 10/01/2010 04:11:39 PM

## 2010-10-02 ENCOUNTER — Other Ambulatory Visit (HOSPITAL_COMMUNITY): Payer: Self-pay | Admitting: Interventional Radiology

## 2010-10-02 DIAGNOSIS — IMO0002 Reserved for concepts with insufficient information to code with codable children: Secondary | ICD-10-CM

## 2010-10-03 ENCOUNTER — Encounter (HOSPITAL_BASED_OUTPATIENT_CLINIC_OR_DEPARTMENT_OTHER): Payer: Medicare Other | Admitting: Oncology

## 2010-10-03 DIAGNOSIS — C496 Malignant neoplasm of connective and soft tissue of trunk, unspecified: Secondary | ICD-10-CM

## 2010-10-07 NOTE — Discharge Summary (Signed)
NAMEVENNIE, Ellison NO.:  [id number]  MEDICAL RECORD NO.:  [id number]  LOCATION:  3025                         FACILITY:  [facility]  PHYSICIAN:  Veronica Barefoot, MD    DATE OF BIRTH:  Mar 14, 1922  DATE OF ADMISSION:  09/26/2010 DATE OF DISCHARGE:  10/01/2010                              DISCHARGE SUMMARY   DISCHARGE DIAGNOSES: 1. Acute on chronic back pain secondary to L3 compression fracture and     status post kyphoplasty. 2. Hip pain secondary to osteoarthritis, tendon tear, iliopsoas     tenosynovitis.  OTHER PAST MEDICAL HISTORY: 1. Hypertension. 2. Atrial fibrillation. 3. GERD. 4. Osteopenia. 5. Osteoarthritis. 6. Spindle cell carcinoma, being followed by Dr. Gaylyn Ellison. 7. History of breast cancer. 8. History of CVA.  DISCHARGE MEDICATIONS: 1. Ensure 237 p.o. 3 times a day. 2. Hydrocodone 1-2 tablets by mouth every 4 hours as needed. 3. Alendronate 70 mg by mouth weekly. 4. Amiodarone 200 mg 1/2-1 tablet every day. 5. Aspirin 325 p.o. daily. 6. Calcium carbonate 1 tablet by mouth twice a day. 7. Compazine 1 tablet by mouth every 6 hours as needed. 8. Diphenhydramine topically twice daily. 9. Imodium 2 mg p.r.n. for diarrhea every 8 hours. 10.Lomotil 1 tablet q.4 hours p.r.n. 11.Magic mouth wash 1 teaspoon by mouth at bedtime. 12.Magnesium oxide 400 mg p.o. b.i.d. 13.Multivitamins 1 tablet daily. 14.Nitroglycerin 0.4 every 5 minutes as needed up to 3 doses. 15.Nystatin topical 1 application 3 times a day as needed. 16.Omeprazole 20 mg p.o. every morning. 17.Zofran 4 mg p.o. under tongue every 8 hours as needed. 18.Systane solution 1 drop in both eyes daily as needed.  BRIEF HISTORY OF PRESENT ILLNESS:  This 77 year old with past medical history of chronic atrial fibrillation, on amiodarone, no Coumadin candidate, history of recurrent sarcoma of the right chest wall, who recently was discharged from our facility in May 2012, after she had an L3  vertebral compression fracture and a repeat kyphoplasty septoplasty and was discharged to skilled nursing facility, but she then was discharged home subsequently.  She presents complaining of worsening back pain, pain is 8/10 in intensity, sharp, unable to ambulate very well.  No fecal or urinary incontinence.  Pain is aggravated by movement.  RADIOGRAPHIC STUDIES:  MRI of the left hip showed prominent tear of the anterior superior acetabular labrum.  Bilateral distal iliopsoas tenosynovitis.  Bilateral hamstring tendinopathy and tendinopathy proximal portion to the rectus femoris.  Prominent synovial herniation pit in the left proximal femoral.  Several small degenerative subcortical lesion are present in the left acetabulum excluding the pubic symphysis.  Left hip x-ray, no acute abnormality.  HOSPITAL COURSE: 1. Intractable back pain secondary to L3 compression fracture.  The     patient was admitted to telemetry.  She received IV pain medication     initially, then she was transitioned to p.o. Vicodin 1-2 tablets     q.4 hours p.r.n. for pain.  Her pain improved slightly.  Dr.     Corliss Ellison was consulted and the patient was scheduled to have a     kyphoplasty.  The patient will be discharged home after     kyphoplasty, she tolerated  the procedure well. 2. Chronic atrial fibrillation, we will continue with her current     medication.  No exacerbation during this hospitalization. 3. Left hip pain.  The patient had a fall back in May 2012.  She had a     CT that was negative.  The patient's pain persisted.  Left hip x-     ray was ordered and it was negative.  An MRI was ordered due to     persistent of the pain on ambulation and it showed prominent tear     of the anterior superior acetabular labrum.  Bilateral iliopsoas     tenosynovitis.  I discussed the MRI with Dr. Victorino Ellison and he     recommended the patient can follow up as an outpatient with him.     Ambulation as  tolerated.  On the day of discharge, the patient was in stable condition.  Blood pressure 129/76, sat 96 on room air, respirations 18, pulse 72, temperature 98.2.  No labs to date.     Veronica Barefoot, MD     BR/MEDQ  D:  10/01/2010  T:  10/02/2010  Job:  [id number]  Electronically Signed by Veronica Barefoot MD on 10/07/2010 08:35:27 AM

## 2010-10-11 ENCOUNTER — Other Ambulatory Visit: Payer: Self-pay | Admitting: Advanced Practice Midwife

## 2010-10-11 DIAGNOSIS — Z9012 Acquired absence of left breast and nipple: Secondary | ICD-10-CM

## 2010-10-15 ENCOUNTER — Other Ambulatory Visit (HOSPITAL_COMMUNITY): Payer: Self-pay | Admitting: Interventional Radiology

## 2010-10-15 ENCOUNTER — Ambulatory Visit (HOSPITAL_COMMUNITY)
Admission: RE | Admit: 2010-10-15 | Discharge: 2010-10-15 | Disposition: A | Discharge status: Home / Self Care | Payer: Medicare Other | Source: Ambulatory Visit | Attending: Interventional Radiology | Admitting: Interventional Radiology

## 2010-10-15 DIAGNOSIS — IMO0002 Reserved for concepts with insufficient information to code with codable children: Secondary | ICD-10-CM

## 2010-10-26 ENCOUNTER — Ambulatory Visit
Admission: RE | Admit: 2010-10-26 | Discharge: 2010-10-26 | Disposition: A | Discharge status: Home / Self Care | Payer: Medicare Other | Source: Ambulatory Visit | Attending: Advanced Practice Midwife | Admitting: Advanced Practice Midwife

## 2010-10-26 DIAGNOSIS — Z9012 Acquired absence of left breast and nipple: Secondary | ICD-10-CM

## 2010-10-29 ENCOUNTER — Ambulatory Visit (HOSPITAL_COMMUNITY)
Admission: RE | Admit: 2010-10-29 | Discharge: 2010-10-29 | Disposition: A | Discharge status: Home / Self Care | Payer: Medicare Other | Source: Ambulatory Visit | Attending: Interventional Radiology | Admitting: Interventional Radiology

## 2010-10-29 DIAGNOSIS — IMO0002 Reserved for concepts with insufficient information to code with codable children: Secondary | ICD-10-CM

## 2010-11-01 ENCOUNTER — Other Ambulatory Visit (HOSPITAL_COMMUNITY): Payer: Self-pay | Admitting: Interventional Radiology

## 2010-11-01 DIAGNOSIS — M549 Dorsalgia, unspecified: Secondary | ICD-10-CM

## 2010-11-05 NOTE — H&P (Signed)
Veronica Ellison, Veronica Ellison                ACCOUNT NO.:  [id number]  MEDICAL RECORD NO.:  [id number]           PATIENT TYPE:  E  LOCATION:  [facility]                         FACILITY:  [facility]  PHYSICIAN:  Adalynd Donahoe I Sacoya Mcgourty, MD      DATE OF BIRTH:  1921/05/06  DATE OF ADMISSION:  08/21/2010 DATE OF DISCHARGE:                             HISTORY & PHYSICAL   PRIMARY CARE PHYSICIAN:  [name], MD  CHIEF COMPLAINT:  Left hip and back pain.  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 Gaylyn Rong.  Last chemotherapy was on August 02, 2010, where the patient received Cytoxan and Adriamycin for a total of 7 days.  The patient admitted she had some insomnia and was prescribed some Ambien.  The day of the fall, the patient took one dose of Ambien 5 mg at 10 p.m. and she went to bed.  And then she woke up again at 2 a.m., she took another dose of Ambien 5 mg p.o.  She woke up in the morning.  She was feeling dizzy and stumbling and the second thing she noticed she had experienced a fall on the bathroom.  The patient cannot recall if she syncopized but apparently she denies any chest pain or shortness of breath.  Denies any further dizziness.  She was evaluated by her MD where x-ray was done and there was a small fracture at L2. Her pain was tolerable with pain medication.  For the last 3 days, the patient continued to complain of lower back pain and left hip pain.  She has to walk bending down and holding on bed or most of the time she has to be on bed because of the severity of her pain.  She presented to the emergency room where an x-ray of her left femur did not show any acute event.  CT of her left hip did not show any occult hip fracture but  CT of the lumbar spine showed acute to subacute superior endplate compression fracture at L2 with mild osseous retropulsion, and secondary to the patient's inability to walk and to participate in her daily activity, the patient preferred to be admitted for further pain management control and possibility of kyphoplasty.  PAST MEDICAL AND SURGICAL HISTORY:  Significant for: 1. 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 2. History of hypertension. 3. History of paroxysmal A-fib on amiodarone. 4. History of thalamic cerebrovascular accident, on aspirin but not on     Coumadin. 5. History of irritable bowel syndrome. 6. History of osteoporosis. 7. History of osteoarthritis. 8. History of left breast cancer, status post surgery and now     reconstruction. 9. History of hysterectomy and salpingo-oophorectomy. 10.Status post cholecystectomy.  MEDICATIONS: 1. Magic mouthwash 1 __________ 4 times daily. 2. Nitroglycerin  sublingual 0.4 mg every 5 minutes as needed. 3. Percocet 5/325 1 tab q.6 h as needed. 4. Restasis. 5. Imodium 2 mg every 8 hours as needed. 6. Clotrimazole cream 10 mg 3 times daily. 7. Aspirin enteric-coated 325 every evening. 8. Compazine. 9. Zofran. 10.Magnesium oxide. 11.Multivitamin. 12.Fosamax once weekly. 13.Calcium carbonate/vitamin D. 14.Omeprazole. 15.Amiodarone 200 mg, 0.5 to 1 mg. 16.Ambien 5 mg at bedtime as needed.  ALLERGIES:  SULFA, TETRACYCLINE, NITROFURANTOIN, MORPHINE SULFATE, AMOXICILLIN, HYDROCODONE, ADHESIVE, OXYCODONE.  REVIEW OF SYSTEMS:  CONSTITUTIONAL:  Denies any headache, denies any blurring of vision, denies any seizures, denies any numbness or weakness of extremities.  CHEST:  Denies any chest pain or shortness of breath, denies any coughing, denies any shortness of breath, denies any paroxysmal nocturnal dyspnea or  orthopnea.  ABDOMEN:  Denies any nausea or vomiting or abdominal pain.  She has regular bowel movements. EXTREMITIES:  Denies any lower extremity weakness.  She complained of lower left hip pain.  PHYSICAL EXAMINATION:  GENERAL:  The patient is lying comfortably in bed.  She is not in respiratory distress or shortness of breath. VITAL SIGNS:  Temperature 98, blood pressure 129/70, pulse rate 72, respiratory rate 18. HEENT:  Normocephalic, atraumatic.  Pupils equally reactive to light and accommodation.  Extraocular muscle movement within normal. NECK:  Supple.  No lymphadenopathy. HEART:  S1 and S2, no added sound. LUNGS:  Fine rales bilaterally. ABDOMEN:  Soft, nontender.  Bowel sounds positive. EXTREMITIES:  No lower limb edema.  Peripheral pulses intact.  On the left hip, there is some ecchymosis.  Some tenderness at the left hip. BACK:  There is no point tenderness. NEUROLOGIC:  Awake, alert, oriented x3 with no focal neurological finding.  LABORATORY AND RADIOLOGIC DATA: 1. Hemoglobin 9.2, hematocrit 27.  Sodium 136, potassium 4.8, chloride     102, glucose 120, BUN 19, creatinine 1.  I do not have white blood     cells currently. 2. CT of the lumbar spine:  Acute to subacute superior endplate     compression fracture at L2 with mild osseous retropulsion.  Mild     superior endplate compression fracture at L1 and L5, unchanged. 3. CT of the left hip:  Negative. 4. X-ray of the femur:  Negative.  ASSESSMENT AND PLAN: 1. Back and left hip pain, likely related to the acute compression     fracture of L2.  The patient will be admitted to the hospital.     Pain control will be provided although the patient has multiple     allergies to pain medications.  Pain medication will be provided     with Dilaudid and Percocet, Ultram and ibuprofen.  We will also ask     physical therapy and occupational therapy to evaluate.  If no     significant improvement, we will ask IR to evaluate  for kyphoplasty     but Oncology has to agree with ongoing plan of care as the patient     has a history of recurrent sarcoma, extension of the sarcoma     unknown to [place].  So I would recommend consulting Dr. Gaylyn Rong if the pain     remained and kyphoplasty is recommended. 2. Status post fall:  The event of the fall is not clear.  Likely     related to benzodiazepine, the patient received 10 mg of     benzodiazepine the night of the fall.  The patient will be under     telemonitor and  we will cycle the patient's cardiac enzymes. 3. The patient received recent chemotherapy.  We will get the     patient's white blood cell, hemoglobin is same 9.2, hematocrit 27,     which is mildly low from before.  We will continue monitoring the     above anemia. 4. History of atrial fibrillation:  We will continue with amiodarone     and we will continue with aspirin.  The patient is not on Coumadin. 5. Noticed on the ED report oxygen saturation of 75%.  We will get     more clarification from the nurse taking care of the patient.  The     patient did not seem to me in respiratory distress but there are     respiratory rales.  I will proceed with chest x-ray and will also     get pelvic x-ray to rule out any sacral fracture. 6. Deep venous thrombosis and gastrointestinal prophylaxis. 7. Further recommendations as hospital course progresses.     Abrial Arrighi Bosie Helper, MD     HIE/MEDQ  D:  08/21/2010  T:  08/21/2010  Job:  [id number]  cc:   Exie Parody, M.D.  [name], M.D. Fax: [phone]  Electronically Signed by Ebony Cargo MD on 11/05/2010 02:31:44 PM

## 2010-11-06 ENCOUNTER — Other Ambulatory Visit: Payer: Self-pay | Admitting: Oncology

## 2010-11-06 ENCOUNTER — Other Ambulatory Visit: Payer: Self-pay | Admitting: Medical

## 2010-11-06 ENCOUNTER — Ambulatory Visit (HOSPITAL_COMMUNITY)
Admission: RE | Admit: 2010-11-06 | Discharge: 2010-11-06 | Disposition: A | Discharge status: Home / Self Care | Payer: Medicare Other | Source: Ambulatory Visit | Attending: Interventional Radiology | Admitting: Interventional Radiology

## 2010-11-06 ENCOUNTER — Encounter (HOSPITAL_BASED_OUTPATIENT_CLINIC_OR_DEPARTMENT_OTHER): Payer: Medicare Other | Admitting: Oncology

## 2010-11-06 ENCOUNTER — Ambulatory Visit (HOSPITAL_COMMUNITY)
Admission: RE | Admit: 2010-11-06 | Discharge: 2010-11-06 | Disposition: A | Discharge status: Home / Self Care | Payer: Medicare Other | Source: Ambulatory Visit | Attending: Oncology | Admitting: Oncology

## 2010-11-06 DIAGNOSIS — M545 Low back pain, unspecified: Secondary | ICD-10-CM | POA: Insufficient documentation

## 2010-11-06 DIAGNOSIS — C499 Malignant neoplasm of connective and soft tissue, unspecified: Secondary | ICD-10-CM

## 2010-11-06 DIAGNOSIS — Z853 Personal history of malignant neoplasm of breast: Secondary | ICD-10-CM | POA: Insufficient documentation

## 2010-11-06 DIAGNOSIS — S32009A Unspecified fracture of unspecified lumbar vertebra, initial encounter for closed fracture: Secondary | ICD-10-CM | POA: Insufficient documentation

## 2010-11-06 DIAGNOSIS — X58XXXA Exposure to other specified factors, initial encounter: Secondary | ICD-10-CM | POA: Insufficient documentation

## 2010-11-06 DIAGNOSIS — M5126 Other intervertebral disc displacement, lumbar region: Secondary | ICD-10-CM | POA: Insufficient documentation

## 2010-11-06 DIAGNOSIS — M79609 Pain in unspecified limb: Secondary | ICD-10-CM | POA: Insufficient documentation

## 2010-11-06 DIAGNOSIS — T451X5A Adverse effect of antineoplastic and immunosuppressive drugs, initial encounter: Secondary | ICD-10-CM

## 2010-11-06 DIAGNOSIS — I10 Essential (primary) hypertension: Secondary | ICD-10-CM | POA: Insufficient documentation

## 2010-11-06 DIAGNOSIS — M48061 Spinal stenosis, lumbar region without neurogenic claudication: Secondary | ICD-10-CM | POA: Insufficient documentation

## 2010-11-06 DIAGNOSIS — M549 Dorsalgia, unspecified: Secondary | ICD-10-CM

## 2010-11-06 DIAGNOSIS — Z9889 Other specified postprocedural states: Secondary | ICD-10-CM | POA: Insufficient documentation

## 2010-11-06 DIAGNOSIS — C496 Malignant neoplasm of connective and soft tissue of trunk, unspecified: Secondary | ICD-10-CM

## 2010-11-06 DIAGNOSIS — E46 Unspecified protein-calorie malnutrition: Secondary | ICD-10-CM

## 2010-11-06 DIAGNOSIS — D6481 Anemia due to antineoplastic chemotherapy: Secondary | ICD-10-CM

## 2010-11-06 DIAGNOSIS — M810 Age-related osteoporosis without current pathological fracture: Secondary | ICD-10-CM

## 2010-11-06 LAB — CBC WITH DIFFERENTIAL/PLATELET
Eosinophils Absolute: 0.1 10*3/uL (ref 0.0–0.5)
MONO#: 0.3 10*3/uL (ref 0.1–0.9)
NEUT#: 2.5 10*3/uL (ref 1.5–6.5)
RBC: 3.69 10*6/uL — ABNORMAL LOW (ref 3.70–5.45)
RDW: 13.6 % (ref 11.2–14.5)
WBC: 4.1 10*3/uL (ref 3.9–10.3)

## 2010-11-06 LAB — COMPREHENSIVE METABOLIC PANEL
Albumin: 3.9 g/dL (ref 3.5–5.2)
Alkaline Phosphatase: 107 U/L (ref 39–117)
CO2: 23 mEq/L (ref 19–32)
Glucose, Bld: 102 mg/dL — ABNORMAL HIGH (ref 70–99)
Potassium: 3.7 mEq/L (ref 3.5–5.3)
Sodium: 139 mEq/L (ref 135–145)
Total Protein: 6.3 g/dL (ref 6.0–8.3)

## 2010-11-08 ENCOUNTER — Other Ambulatory Visit (HOSPITAL_COMMUNITY): Payer: Self-pay | Admitting: Interventional Radiology

## 2010-11-08 DIAGNOSIS — IMO0002 Reserved for concepts with insufficient information to code with codable children: Secondary | ICD-10-CM

## 2010-11-13 ENCOUNTER — Ambulatory Visit (HOSPITAL_COMMUNITY)
Admission: RE | Admit: 2010-11-13 | Discharge: 2010-11-13 | Disposition: A | Discharge status: Home / Self Care | Payer: Medicare Other | Source: Ambulatory Visit | Attending: Oncology | Admitting: Oncology

## 2010-11-13 DIAGNOSIS — R9389 Abnormal findings on diagnostic imaging of other specified body structures: Secondary | ICD-10-CM | POA: Insufficient documentation

## 2010-11-13 DIAGNOSIS — Z853 Personal history of malignant neoplasm of breast: Secondary | ICD-10-CM | POA: Insufficient documentation

## 2010-11-13 DIAGNOSIS — Z8589 Personal history of malignant neoplasm of other organs and systems: Secondary | ICD-10-CM | POA: Insufficient documentation

## 2010-11-13 DIAGNOSIS — R229 Localized swelling, mass and lump, unspecified: Secondary | ICD-10-CM | POA: Insufficient documentation

## 2010-11-13 MED ORDER — GADOBENATE DIMEGLUMINE 529 MG/ML IV SOLN
10.0000 mL | Freq: Once | INTRAVENOUS | Status: AC | PRN
  Administered 2010-11-13: 10 mL via INTRAVENOUS

## 2010-11-14 ENCOUNTER — Other Ambulatory Visit (HOSPITAL_COMMUNITY): Payer: Medicare Other

## 2010-11-21 ENCOUNTER — Encounter (INDEPENDENT_AMBULATORY_CARE_PROVIDER_SITE_OTHER): Payer: Self-pay | Admitting: General Surgery

## 2010-11-22 ENCOUNTER — Encounter (INDEPENDENT_AMBULATORY_CARE_PROVIDER_SITE_OTHER): Payer: Self-pay

## 2010-11-22 ENCOUNTER — Ambulatory Visit (INDEPENDENT_AMBULATORY_CARE_PROVIDER_SITE_OTHER): Payer: Medicare Other | Admitting: General Surgery

## 2010-11-22 VITALS — BP 106/72 | HR 60 | Temp 97.6°F | Ht 62.0 in | Wt 98.6 lb

## 2010-11-22 DIAGNOSIS — C493 Malignant neoplasm of connective and soft tissue of thorax: Secondary | ICD-10-CM | POA: Insufficient documentation

## 2010-11-22 NOTE — Progress Notes (Signed)
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 Over the last couple of years she has had at least for surgical excisions for the primary and a recurrent tumor seen by [name]. He last presented with a recurrence near the operative field this past fall. This was a small area and she subsequently had radiation treatment by Dr. Dayton Scrape with good clinical response. She recently attempted a course of chemotherapy but was unable to tolerate this and this was stopped.  For about a month she has noted a lump just above and posterior to her previous surgical site. She has had an MRI that I reviewed. This shows an apparent 4 cm recurrence of soft tissue tumor in the anterior border of the latissimus dorsi on the right. There is no mention of any pulmonary or metastatic disease.  Exam: Gen.: Elderly, thin, but alert and pleasant white female Lymph nodes: No cervical, supraclavicular or axillary nodes palpable. Chest: Breath sounds are clear. There is a healed surgical incision with some induration and scarring on the lateral chest wall. About 5 cm posterior and superior to this incision at the anterior border of the latissimus dorsi is an approximately 4-5 cm firm fixed mass.  Assessment and plan: Multiply recurrent sarcoma of the chest wall with now an apparent recurrence near the previous operative and radiation field. There is no further opportunity for chemotherapy. I discussed this with Dr. Dayton Scrape. He is to see the patient to see if this is in the previous radiated field. If not it may be amenable to surgical resection. If it is in the radiated field we may have serious problems with wound healing. Dr. Dayton Scrape feels that the size of the mass would require additional surgical treatment rather than primary radiation if indeed the area has not yet been radiated.  Following Dr. Rennie Plowman assessment, we will get her back to see Dr.  Janee Morn next week as he is intimately familiar with her case.

## 2010-11-22 NOTE — Patient Instructions (Signed)
Radiation oncology will contact you for an appointment in our office will contact you for an appointment with [name] next week

## 2010-11-28 ENCOUNTER — Ambulatory Visit
Admission: RE | Admit: 2010-11-28 | Discharge: 2010-11-28 | Disposition: A | Discharge status: Home / Self Care | Payer: Medicare Other | Source: Ambulatory Visit | Attending: Radiation Oncology | Admitting: Radiation Oncology

## 2010-11-30 ENCOUNTER — Emergency Department (HOSPITAL_COMMUNITY): Payer: Medicare Other

## 2010-11-30 ENCOUNTER — Inpatient Hospital Stay (HOSPITAL_COMMUNITY)
Admission: EM | Admit: 2010-11-30 | Discharge: 2010-12-04 | DRG: 543 | Disposition: A | Discharge status: Home / Self Care | Payer: Medicare Other | Attending: Family Medicine | Admitting: Family Medicine

## 2010-11-30 ENCOUNTER — Ambulatory Visit (INDEPENDENT_AMBULATORY_CARE_PROVIDER_SITE_OTHER): Payer: Medicare Other | Admitting: General Surgery

## 2010-11-30 ENCOUNTER — Encounter (INDEPENDENT_AMBULATORY_CARE_PROVIDER_SITE_OTHER): Payer: Self-pay | Admitting: General Surgery

## 2010-11-30 VITALS — BP 120/82 | HR 72 | Wt 102.4 lb

## 2010-11-30 DIAGNOSIS — Z7982 Long term (current) use of aspirin: Secondary | ICD-10-CM

## 2010-11-30 DIAGNOSIS — M1990 Unspecified osteoarthritis, unspecified site: Secondary | ICD-10-CM | POA: Diagnosis present

## 2010-11-30 DIAGNOSIS — I4891 Unspecified atrial fibrillation: Secondary | ICD-10-CM | POA: Diagnosis present

## 2010-11-30 DIAGNOSIS — K589 Irritable bowel syndrome without diarrhea: Secondary | ICD-10-CM | POA: Diagnosis present

## 2010-11-30 DIAGNOSIS — C493 Malignant neoplasm of connective and soft tissue of thorax: Secondary | ICD-10-CM | POA: Diagnosis present

## 2010-11-30 DIAGNOSIS — Z79899 Other long term (current) drug therapy: Secondary | ICD-10-CM

## 2010-11-30 DIAGNOSIS — I10 Essential (primary) hypertension: Secondary | ICD-10-CM | POA: Diagnosis present

## 2010-11-30 DIAGNOSIS — Z853 Personal history of malignant neoplasm of breast: Secondary | ICD-10-CM

## 2010-11-30 DIAGNOSIS — M8448XA Pathological fracture, other site, initial encounter for fracture: Principal | ICD-10-CM | POA: Diagnosis present

## 2010-11-30 DIAGNOSIS — M810 Age-related osteoporosis without current pathological fracture: Secondary | ICD-10-CM | POA: Diagnosis present

## 2010-11-30 DIAGNOSIS — Z8673 Personal history of transient ischemic attack (TIA), and cerebral infarction without residual deficits: Secondary | ICD-10-CM | POA: Diagnosis not present

## 2010-11-30 DIAGNOSIS — Z538 Procedure and treatment not carried out for other reasons: Secondary | ICD-10-CM

## 2010-11-30 DIAGNOSIS — K219 Gastro-esophageal reflux disease without esophagitis: Secondary | ICD-10-CM | POA: Diagnosis present

## 2010-11-30 LAB — DIFFERENTIAL
Basophils Absolute: 0 10*3/uL (ref 0.0–0.1)
Eosinophils Relative: 2 % (ref 0–5)
Lymphocytes Relative: 27 % (ref 12–46)
Lymphs Abs: 1.3 10*3/uL (ref 0.7–4.0)
Neutro Abs: 3 10*3/uL (ref 1.7–7.7)
Neutrophils Relative %: 62 % (ref 43–77)

## 2010-11-30 LAB — BASIC METABOLIC PANEL
BUN: 17 mg/dL (ref 6–23)
CO2: 28 mEq/L (ref 19–32)
Chloride: 106 mEq/L (ref 96–112)
GFR calc Af Amer: >60 mL/min (ref >60)
Potassium: 4.3 mEq/L (ref 3.5–5.1)

## 2010-11-30 LAB — CBC
HCT: 35.1 % — ABNORMAL LOW (ref 36.0–46.0)
RBC: 3.68 MIL/uL — ABNORMAL LOW (ref 3.87–5.11)
RDW: 14 % (ref 11.5–15.5)
WBC: 4.8 10*3/uL (ref 4.0–10.5)

## 2010-11-30 NOTE — Progress Notes (Signed)
Addended byIvory Broad on: 11/30/2010 12:11 PM   Modules accepted: Orders

## 2010-11-30 NOTE — Progress Notes (Signed)
Subjective:     Patient ID: Veronica Ellison, female   DOB: 08-11-1921, 77 y.o.   MRN: [id number]  HPI Patient well known to me S/P excision of recurrent spindle cell sarcoma right chest wall. Patient completed two rounds of chemo by Dr. Gaylyn Rong but then had a fall and was too frail to continue.  She was found to have a mass lower right axilla.  MR shows 3x4 CM mass along the anterior latissimus consistent with sarcoma.  I reviewed the MR with one of the radiologists and this mass is easily seen.  They also pointed out another mass lower rib area 1CM suspicious as well.  Pt notes palpable soreness both areas.  She also has been recommended to have another L4 kyphoplasty soon.  Review of Systems     Objective:   Physical Exam  Eyes: Pupils are equal, round, and reactive to light. No scleral icterus.  Neck: Normal range of motion. Neck supple.  Cardiovascular: Normal rate and regular rhythm.   Pulmonary/Chest: Effort normal. No respiratory distress. She has no wheezes. She exhibits tenderness.  Abdominal: Soft. There is no tenderness.  Right axilla 3.5CM mass palpable along anterior latissimus - tender, fixed to muscle but mobile 1cm mass below old scar tender along posterior rib Old scar without other nodules     Assessment:     Likely regional recurrent sarcoma right anterior latissimus and local recurrent sarcoma right posterior rib area    Plan:     I have offered excision of both areas with planned overnight stay in light of patient's physical status.  There is significant risk of some muscular disability with removal of the larger mass.  Procedure, risks, benefits discussed with patient and daughter.  She wants to proceed and expresses understanding.

## 2010-12-03 LAB — CBC
MCH: 30 pg (ref 26.0–34.0)
MCHC: 31.5 g/dL (ref 30.0–36.0)
MCV: 95.3 fL (ref 78.0–100.0)
Platelets: 210 10*3/uL (ref 150–400)
RDW: 13.9 % (ref 11.5–15.5)
WBC: 4.9 10*3/uL (ref 4.0–10.5)

## 2010-12-03 LAB — BASIC METABOLIC PANEL
BUN: 21 mg/dL (ref 6–23)
CO2: 30 mEq/L (ref 19–32)
Calcium: 8.9 mg/dL (ref 8.4–10.5)
Creatinine, Ser: 0.59 mg/dL (ref 0.50–1.10)
GFR calc non Af Amer: >60 mL/min (ref >60)
Glucose, Bld: 90 mg/dL (ref 70–99)

## 2010-12-03 LAB — PROTIME-INR: Prothrombin Time: 12.6 seconds (ref 11.6–15.2)

## 2010-12-05 ENCOUNTER — Other Ambulatory Visit (HOSPITAL_COMMUNITY): Payer: Self-pay | Admitting: Interventional Radiology

## 2010-12-05 DIAGNOSIS — T148XXA Other injury of unspecified body region, initial encounter: Secondary | ICD-10-CM

## 2010-12-07 ENCOUNTER — Other Ambulatory Visit: Payer: Self-pay | Admitting: Interventional Radiology

## 2010-12-07 ENCOUNTER — Ambulatory Visit (HOSPITAL_COMMUNITY)
Admission: RE | Admit: 2010-12-07 | Discharge: 2010-12-07 | Disposition: A | Discharge status: Home / Self Care | Payer: Medicare Other | Source: Ambulatory Visit | Attending: Interventional Radiology | Admitting: Interventional Radiology

## 2010-12-07 DIAGNOSIS — Z8673 Personal history of transient ischemic attack (TIA), and cerebral infarction without residual deficits: Secondary | ICD-10-CM | POA: Insufficient documentation

## 2010-12-07 DIAGNOSIS — M8448XA Pathological fracture, other site, initial encounter for fracture: Secondary | ICD-10-CM | POA: Insufficient documentation

## 2010-12-07 DIAGNOSIS — K589 Irritable bowel syndrome without diarrhea: Secondary | ICD-10-CM | POA: Insufficient documentation

## 2010-12-07 DIAGNOSIS — I472 Ventricular tachycardia, unspecified: Secondary | ICD-10-CM | POA: Insufficient documentation

## 2010-12-07 DIAGNOSIS — I4891 Unspecified atrial fibrillation: Secondary | ICD-10-CM | POA: Insufficient documentation

## 2010-12-07 DIAGNOSIS — D649 Anemia, unspecified: Secondary | ICD-10-CM | POA: Insufficient documentation

## 2010-12-07 DIAGNOSIS — I4729 Other ventricular tachycardia: Secondary | ICD-10-CM | POA: Insufficient documentation

## 2010-12-07 DIAGNOSIS — M810 Age-related osteoporosis without current pathological fracture: Secondary | ICD-10-CM | POA: Insufficient documentation

## 2010-12-07 DIAGNOSIS — I10 Essential (primary) hypertension: Secondary | ICD-10-CM | POA: Insufficient documentation

## 2010-12-07 DIAGNOSIS — M1990 Unspecified osteoarthritis, unspecified site: Secondary | ICD-10-CM | POA: Insufficient documentation

## 2010-12-07 DIAGNOSIS — T148XXA Other injury of unspecified body region, initial encounter: Secondary | ICD-10-CM

## 2010-12-07 DIAGNOSIS — Z853 Personal history of malignant neoplasm of breast: Secondary | ICD-10-CM | POA: Insufficient documentation

## 2010-12-07 LAB — POCT I-STAT, CHEM 8
Calcium, Ion: 1.22 mmol/L (ref 1.12–1.32)
Creatinine, Ser: 1 mg/dL (ref 0.50–1.10)
Glucose, Bld: 88 mg/dL (ref 70–99)
HCT: 37 % (ref 36.0–46.0)
Hemoglobin: 12.6 g/dL (ref 12.0–15.0)

## 2010-12-07 LAB — CULTURE, BLOOD (ROUTINE X 2)
Culture  Setup Time: 201208251109
Culture  Setup Time: 201208251110

## 2010-12-11 NOTE — H&P (Signed)
NAMEJANNICE, Ellison NO.:  [id number]  MEDICAL RECORD NO.:  [id number]  LOCATION:                                 FACILITY:  PHYSICIAN:  Houston Siren, MD           DATE OF BIRTH:  1922-03-26  DATE OF ADMISSION:  11/30/2010 DATE OF DISCHARGE:                             HISTORY & PHYSICAL   PRIMARY CARE PHYSICIAN:  L. Lupe Carney, MD  REASON FOR ADMISSION:  Intractable back pain.  ADVANCE DIRECTIVE:  Full code.  HISTORY OF PRESENT ILLNESS:  This is an 77 year old female with history of atrial fibrillation, breast cancer, hypertension, sarcoma, fell and had an L4 compression fracture.  Apparently, she also had an L3 compression fracture in June and had kyphoplasty at that time.  She is not a Coumadin candidate for her atrial fibrillation according to her cardiologist, but has been on amiodarone.  She has recurrent sarcoma on her right chest and had chemotherapy several months ago.  She was seen in consultation with Orthopedics, Dr. Corliss Skains, 3 weeks ago and had plan to have another kyphoplasty.  She presented to the emergency room because of the increased low back pain.  She was given Dilaudid in the emergency room and did not have adequate control of her pain, and thus hospitalist was asked to admit the patient for intractable back pain due to the L4 compressive fracture.  PAST MEDICAL HISTORY: 1. Hypertension. 2. Atrial fibrillation. 3. GERD. 4. Irritable bowel syndrome. 5. Osteopenia. 6. Osteoarthritis. 7. Spindle cell carcinoma, followed by Dr. Margo Aye and Dr. Dayton Scrape of     Radiation Oncology. 8. History of breast cancer status post mastectomy in the 90s. 9. History of CVA.  CURRENT MEDICATIONS:  Amiodarone, Prilosec, Fosamax, aspirin, hydrocodone, acetaminophen.  ALLERGIES:  AMOXICILLIN, DEMEROL, MORPHINE, MACRODANTIN, OXYCODONE, SULFA, TETRACYCLINE, and ADHESIVE.  SOCIAL HISTORY:  Noncontributory.  REVIEW OF SYSTEMS:  Otherwise  unremarkable.  PHYSICAL EXAMINATION:  VITAL SIGNS:  Blood pressure 165/73, pulse of 65, respiratory rate of 12, and temperature of 98.3. GENERAL:  She is alert and oriented and conversing.  She was able to sit up and put her down on.  She does not look anything in particular excruciating pain but it should be noted that she was given pain medication recently. HEENT:  She has facial symmetry and fluent speech.  Tongue is midline. CARDIAC:  S1 and S2 without any murmur, rub, or gallop. LUNGS:  Clear with adequate inspiratory and expiratory effort. ABDOMEN:  Soft, nondistended, nontender. EXTREMITIES:  No edema.  No calf tenderness. SKIN:  Warm and dry. NEUROLOGIC:  Her lower extremity strength is strong.  LABORATORY STUDY:  Not yet done.  IMPRESSION:  This is an 77 year old female with history of breast cancer, sarcoma, L4 compression fracture, on chronic narcotic, prior L3 compression fracture admitted for intractable back pain.  We will give her low-dose Dilaudid, and we will resume all her medications.  Dr. Corliss Skains will see her in consultation and likely will do a kyphoplasty. She is, otherwise, very stable, full code, and I will admit her to [facility].  We will get routine blood work to make sure all  are within normal ranges.  I will get an EKG because she is on amiodarone.     Houston Siren, MD     PL/MEDQ  D:  11/30/2010  T:  11/30/2010  Job:  [id number]  Electronically Signed by Houston Siren  on 12/11/2010 03:09:45 AM

## 2010-12-13 ENCOUNTER — Other Ambulatory Visit (HOSPITAL_COMMUNITY): Payer: Self-pay | Admitting: Interventional Radiology

## 2010-12-13 DIAGNOSIS — T148XXA Other injury of unspecified body region, initial encounter: Secondary | ICD-10-CM

## 2010-12-24 ENCOUNTER — Encounter (HOSPITAL_COMMUNITY)
Admission: RE | Admit: 2010-12-24 | Discharge: 2010-12-24 | Disposition: A | Discharge status: Home / Self Care | Payer: Medicare Other | Source: Ambulatory Visit | Attending: General Surgery | Admitting: General Surgery

## 2010-12-24 ENCOUNTER — Ambulatory Visit (HOSPITAL_COMMUNITY)
Admission: RE | Admit: 2010-12-24 | Discharge: 2010-12-24 | Disposition: A | Discharge status: Home / Self Care | Payer: Medicare Other | Source: Ambulatory Visit | Attending: Interventional Radiology | Admitting: Interventional Radiology

## 2010-12-24 DIAGNOSIS — M549 Dorsalgia, unspecified: Secondary | ICD-10-CM | POA: Insufficient documentation

## 2010-12-24 DIAGNOSIS — T148XXA Other injury of unspecified body region, initial encounter: Secondary | ICD-10-CM

## 2010-12-24 LAB — BASIC METABOLIC PANEL
BUN: 16 mg/dL (ref 6–23)
Calcium: 9.7 mg/dL (ref 8.4–10.5)
GFR calc Af Amer: >60 mL/min (ref >60)
GFR calc non Af Amer: >60 mL/min (ref >60)
Glucose, Bld: 104 mg/dL — ABNORMAL HIGH (ref 70–99)
Potassium: 5 mEq/L (ref 3.5–5.1)
Sodium: 142 mEq/L (ref 135–145)

## 2010-12-24 LAB — CBC
MCH: 31 pg (ref 26.0–34.0)
MCHC: 32.4 g/dL (ref 30.0–36.0)
RDW: 15.2 % (ref 11.5–15.5)

## 2010-12-24 LAB — SURGICAL PCR SCREEN: Staphylococcus aureus: NEGATIVE

## 2010-12-27 ENCOUNTER — Ambulatory Visit (HOSPITAL_COMMUNITY)
Admission: RE | Admit: 2010-12-27 | Discharge: 2010-12-28 | Disposition: A | Discharge status: Home / Self Care | Payer: Medicare Other | Source: Ambulatory Visit | Attending: General Surgery | Admitting: General Surgery

## 2010-12-27 ENCOUNTER — Other Ambulatory Visit (INDEPENDENT_AMBULATORY_CARE_PROVIDER_SITE_OTHER): Payer: Self-pay | Admitting: General Surgery

## 2010-12-27 DIAGNOSIS — Z8673 Personal history of transient ischemic attack (TIA), and cerebral infarction without residual deficits: Secondary | ICD-10-CM | POA: Insufficient documentation

## 2010-12-27 DIAGNOSIS — K219 Gastro-esophageal reflux disease without esophagitis: Secondary | ICD-10-CM | POA: Insufficient documentation

## 2010-12-27 DIAGNOSIS — C493 Malignant neoplasm of connective and soft tissue of thorax: Secondary | ICD-10-CM

## 2010-12-27 DIAGNOSIS — I10 Essential (primary) hypertension: Secondary | ICD-10-CM | POA: Insufficient documentation

## 2010-12-27 DIAGNOSIS — M4120 Other idiopathic scoliosis, site unspecified: Secondary | ICD-10-CM | POA: Insufficient documentation

## 2010-12-27 HISTORY — PX: OTHER SURGICAL HISTORY: SHX169

## 2010-12-28 ENCOUNTER — Encounter (HOSPITAL_COMMUNITY): Payer: Medicare Other

## 2010-12-28 ENCOUNTER — Ambulatory Visit (HOSPITAL_COMMUNITY): Payer: Medicare Other

## 2010-12-28 MED ORDER — TECHNETIUM TC 99M MEDRONATE IV KIT
25.0000 | PACK | Freq: Once | INTRAVENOUS | Status: AC | PRN
  Administered 2010-12-28: 6 via INTRAVENOUS

## 2010-12-28 NOTE — Discharge Summary (Signed)
  NAMEMENDE, BISWELL NO.:  [id number]  MEDICAL RECORD NO.:  [id number]  LOCATION:  5008                         FACILITY:  [facility]  PHYSICIAN:  Tarry Kos, MD       DATE OF BIRTH:  04-27-1921  DATE OF ADMISSION:  11/30/2010 DATE OF DISCHARGE:  12/04/2010                              DISCHARGE SUMMARY   DISCHARGE DIAGNOSES: 1. Acute on chronic back pain. 2. New L4 compression fracture. 3. History of sarcoma. 4. History of atrial fibrillation.  HOSPITAL COURSE:  [name] is an 77 year old female with osteoporosis and chronic back pain who came in with worsening back pain and was found to have a relatively new L4 compression fracture.  She required some IV narcotics initially.  Dr. Corliss Skains was consulted and recommended kyphoplasty, however, her insurance is not improving that.  We have been waiting for several days for insurance approval.  Ms. Deangelo has not required any IV narcotics in several days.  Her pain is some improved and is tolerable with her chronic pain medications.  She is about 30% better, however, she does not wish to increase her narcotic use because it causes oversedation and would probably certainly increase her fall risk.  She does not wish to continue to wait in the hospital for her insurance approval for kyphoplasty.  I have spoken to Interventional Radiology.  They will follow up with this and try to arrange this at a future date once it is approved.  She has had previous kyphoplasties in the past but she has had significant improvement in her back pain prior to this compression fracture.  So, I suspect that she will benefit greatly from repeat kyphoplasty at L4.  PHYSICAL EXAMINATION:  VITAL SIGNS:  She has been afebrile.  Vital signs have been stable. GENERAL:  She is ambulating with the assistance of a rolling walker. Her pain does get worse with mobility.  She has plenty of help at home. She is alert and oriented in no apparent  distress, cooperative, and friendly. HEENT:  Extraocular muscles are intact.  Pupils equal, round, and reactive to light.  Oropharynx is clear.  Mucous membranes are moist. NECK:  No JVD.  No carotid bruits. COR:  Regular rate and rhythm without murmurs or gallops. CHEST:  Clear to auscultation bilaterally.  No wheezes or rhonchi. ABDOMEN:  Soft, nontender, and nondistended.  Positive bowel sounds.  No hepatosplenomegaly. EXTREMITIES:  No clubbing, cyanosis, or edema. PSYCH:  Normal mood and affect. NEURO:  No focal neurologic deficits. SKIN:  No rashes.  DISPOSITION:  The patient is being discharged home with close outpatient followup.  The Interventional Radiology Department is going to call her in the next day or so when she does get approval for her kyphoplasty and recommend a future plan.  There have been no changes to her home medication list.  She is being discharged home in a stable condition.          ______________________________ Tarry Kos, MD     RD/MEDQ  D:  12/04/2010  T:  12/04/2010  Job:  [id number]  Electronically Signed by Tarry Kos MD on 12/28/2010 11:56:25 AM

## 2010-12-31 LAB — PROTIME-INR: Prothrombin Time: 18 — ABNORMAL HIGH

## 2010-12-31 LAB — PREPARE FRESH FROZEN PLASMA

## 2010-12-31 LAB — TYPE AND SCREEN: ABO/RH(D): O POS

## 2010-12-31 LAB — CBC
MCHC: 33.9
MCV: 92.1
Platelets: 261
RDW: 14.2

## 2010-12-31 LAB — ABO/RH: ABO/RH(D): O POS

## 2011-01-03 ENCOUNTER — Other Ambulatory Visit: Payer: Self-pay | Admitting: Oncology

## 2011-01-03 ENCOUNTER — Encounter (HOSPITAL_BASED_OUTPATIENT_CLINIC_OR_DEPARTMENT_OTHER): Payer: Medicare Other | Admitting: Oncology

## 2011-01-03 ENCOUNTER — Telehealth (INDEPENDENT_AMBULATORY_CARE_PROVIDER_SITE_OTHER): Payer: Self-pay | Admitting: General Surgery

## 2011-01-03 DIAGNOSIS — B370 Candidal stomatitis: Secondary | ICD-10-CM

## 2011-01-03 DIAGNOSIS — Z5189 Encounter for other specified aftercare: Secondary | ICD-10-CM

## 2011-01-03 DIAGNOSIS — M810 Age-related osteoporosis without current pathological fracture: Secondary | ICD-10-CM

## 2011-01-03 DIAGNOSIS — D61818 Other pancytopenia: Secondary | ICD-10-CM

## 2011-01-03 DIAGNOSIS — C496 Malignant neoplasm of connective and soft tissue of trunk, unspecified: Secondary | ICD-10-CM

## 2011-01-03 LAB — CBC WITH DIFFERENTIAL/PLATELET
Eosinophils Absolute: 0.2 10*3/uL (ref 0.0–0.5)
LYMPH%: 18.8 % (ref 14.0–49.7)
MCHC: 33.8 g/dL (ref 31.5–36.0)
MCV: 95.9 fL (ref 79.5–101.0)
MONO%: 7.5 % (ref 0.0–14.0)
NEUT#: 3.6 10*3/uL (ref 1.5–6.5)
Platelets: 210 10*3/uL (ref 145–400)
RBC: 3.33 10*6/uL — ABNORMAL LOW (ref 3.70–5.45)

## 2011-01-03 LAB — COMPREHENSIVE METABOLIC PANEL WITH GFR
ALT: 12 U/L (ref 0–35)
AST: 20 U/L (ref 0–37)
Albumin: 3.8 g/dL (ref 3.5–5.2)
Alkaline Phosphatase: 87 U/L (ref 39–117)
BUN: 19 mg/dL (ref 6–23)
CO2: 25 meq/L (ref 19–32)
Calcium: 9.3 mg/dL (ref 8.4–10.5)
Chloride: 105 meq/L (ref 96–112)
Creatinine, Ser: 0.89 mg/dL (ref 0.50–1.10)
Glucose, Bld: 97 mg/dL (ref 70–99)
Potassium: 4.8 meq/L (ref 3.5–5.3)
Sodium: 140 meq/L (ref 135–145)
Total Bilirubin: 0.5 mg/dL (ref 0.3–1.2)
Total Protein: 6.1 g/dL (ref 6.0–8.3)

## 2011-01-03 NOTE — Discharge Summary (Signed)
  Veronica Ellison, FERNANDO NO.:  [id number]  MEDICAL RECORD NO.:  [id number]  LOCATION:  5118                         FACILITY:  [facility]  PHYSICIAN:  Gabrielle Dare. Janee Morn, M.D.DATE OF BIRTH:  06/22/1921  DATE OF ADMISSION:  12/27/2010 DATE OF DISCHARGE:  12/28/2010                              DISCHARGE SUMMARY   DISCHARGE DIAGNOSES: 1. Recurrent spindle cell sarcoma, right chest wall and right axilla. 2. Status post resection of sarcoma, right axilla and resection of     sarcoma, right chest wall. 3. Multiple spinal compression fractures by history with associated     pain.  HISTORY OF PRESENT ILLNESS:  Veronica Ellison is an 77 year old female with a history of spindle cell sarcoma of her left chest wall.  She has developed what appears to be 2 regional recurrences, one in her lower posterior ribs and one in her right axilla, and she presented for elective resection.  Of note, she also has history of multiple compression fractures in her back and Dr. Corliss Skains from Interventional Radiology had planned further kyphoplasty or sacroplasty actually.  He was planning on seeing her while she was in the hospital.  HOSPITAL COURSE:  The patient underwent uncomplicated resection of sarcoma in right axilla and right chest wall.  Postoperatively, she was afebrile and hemodynamically stable.  She had good pain control.  On postoperative day #1, she was seen by Interventional Radiology and they did a bone scan for further evaluation and they plan to set up outpatient sacroplasty.  She otherwise has done well from a surgical standpoint and is discharged home with family on postoperative day #1.  DISCHARGE DIET:  Regular.  DISCHARGE ACTIVITY:  No lifting for 2 weeks.  DISCHARGE MEDICATIONS: 1. Oxycodone/APAP 5/325 one to two every 6 hours as needed for pain. 2. Alendronate 70 mg p.o. weekly. 3. Docusate 100 mg p.o. t.i.d. 4. Ondansetron 4 mg p.o. q.8 h. as needed. 5.  Nitroglycerin sublingual as needed. 6. Lomotil 1 tablet 4 times a day as needed. 7. Amiodarone 200 mg p.o. Tuesday, Thursday, Saturday, and Sunday and     10 0 mg p.o. Monday, Wednesday, and Friday. 8. Aspirin 325 mg p.o. daily. 9. Calcium carbonate 1 tablet b.i.d. 10.Compazine 10 mg p.o. q.6 h. p.r.n. 11.Imodium 1-2 tablets every 8 hours as needed. 12.Multivitamin 1 daily. 13.Omeprazole 20 mg every morning. 14.Restasis drops in both eyes twice daily.  FOLLOWUP:  With myself in 2-3 weeks.  In addition, she will be setting up for outpatient sacroplasty with Dr. Corliss Skains.     Gabrielle Dare Janee Morn, M.D.     BET/MEDQ  D:  12/28/2010  T:  12/28/2010  Job:  [id number]  cc:   Exie Parody, M.D. Dr. Kerby Nora Maryln Gottron, M.D.  Electronically Signed by Violeta Gelinas M.D. on 01/03/2011 01:06:21 PM

## 2011-01-03 NOTE — Op Note (Signed)
Veronica Ellison, BUI NO.:  [id number]  MEDICAL RECORD NO.:  [id number]  LOCATION:  5118                         FACILITY:  [facility]  PHYSICIAN:  Gabrielle Dare. Veronica Ellison, M.D.DATE OF BIRTH:  [birth year]  DATE OF PROCEDURE:  12/27/2010 DATE OF DISCHARGE:                              OPERATIVE REPORT   PREOPERATIVE DIAGNOSIS:  Recurrent sarcoma, right axilla and recurrent sarcoma, right chest wall.  POSTOPERATIVE DIAGNOSES:  Recurrent sarcoma, right axilla and recurrent sarcoma, right chest wall.  PROCEDURES: 1. Excision of recurrent sarcoma, right axilla, 4 x 3 cm with layered     closure. 2. Excision of recurrent sarcoma, right chest wall, 2 x 1 cm with     layered closure.  SURGEON:  Gabrielle Dare. Veronica Morn, MD  ANESTHESIA:  General endotracheal.  HISTORY OF PRESENT ILLNESS:  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  She has had several regional recurrence.  She has undergone chemotherapy and radiation.  She developed a new palpable mass in her right axilla.  MR was done showing a 3.4 cm mass along the anterior latissimus dorsi consistent with a recurrence.  There was also a 1-cm mass over her lower rib area which was suspicious for another recurrence.  She presents today for excision of both of these suspected regional recurrences of her spindle cell sarcoma.  PROCEDURE IN DETAIL:  Informed consent was obtained.  The patient's sites were marked.  She received intravenous antibiotics.  She was brought to the operating room.  General endotracheal anesthesia was administered by the Anesthesia staff.  She was placed in right side up lateral position.  Her right axilla and chest wall were prepped and draped in sterile fashion.  Attention was first directed to the axillary area.  There was a palpable mass of 3-4 cm in size along her anterior latissimus.  An incision along the tissue lines was made over the  top of this palpable mass.  Subcutaneous tissues were dissected down.  The mass was then circumferentially dissected attempting to get margins of normal tissue all around.  It emanated from the anterior aspect of the latissimus muscle.  Some margin of normal muscle was taken with it as well.  It was circumferentially dissected with cautery and removed completely.  It was marked for orientation for pathology.  Hemostasis was obtained with Bovie cautery.  Next, I changed my gloves, then I took a margin off the muscle to send it to pathology as well to confirm clear margin from the latissimus.  The wound was copiously irrigated and then the subcutaneous tissues closed with running 3-0 Vicryl suture and skin closed with running 4-0 Monocryl subcuticular stitch followed by Dermabond.  Next, a transverse incision was made over the other palpable mass along the lower posterior rib.  Subcutaneous tissues were dissected down revealing a well-circumscribed mass.  This was circumferentially dissected out attempting to get margins of normal tissue all around. This emanated right over her rib in that location which was completely removed.  Again, attempting to get margins, it was marked for orientation for pathology and sent.  I changed my gloves.  I sent some deep margin of tissue.  This was removed and sent to pathology as well. The wound was copiously irrigated and then closed after ensuring hemostasis.  Closure was done of subcutaneous tissues approximated with running 3-0 Vicryl suture and the skin closed with running 4-0 Monocryl subcuticular stitch followed by Dermabond.  Sponge, needle, and instrument counts were all correct.  The patient tolerated the procedure well without apparent complication and was taken to the recovery room in stable condition.     Gabrielle Dare Veronica Ellison, M.D.     BET/MEDQ  D:  12/27/2010  T:  12/27/2010  Job:  [id number]  cc:   Maryln Gottron, M.D. Exie Parody,  M.D.  Electronically Signed by Violeta Gelinas M.D. on 01/03/2011 01:06:17 PM

## 2011-01-03 NOTE — Telephone Encounter (Signed)
Tried to call patient to discuss pathology. No answer.

## 2011-01-07 LAB — DIFFERENTIAL
Lymphocytes Relative: 11 — ABNORMAL LOW
Lymphs Abs: 1.2
Neutrophils Relative %: 83 — ABNORMAL HIGH

## 2011-01-07 LAB — URINE CULTURE: Colony Count: NO GROWTH

## 2011-01-07 LAB — URINALYSIS, ROUTINE W REFLEX MICROSCOPIC
Bilirubin Urine: NEGATIVE
Glucose, UA: NEGATIVE
Ketones, ur: NEGATIVE
Leukocytes, UA: NEGATIVE
Protein, ur: NEGATIVE

## 2011-01-07 LAB — CBC
Hemoglobin: 12.7
MCHC: 33.4
MCV: 96.4
RBC: 3.94

## 2011-01-07 LAB — COMPREHENSIVE METABOLIC PANEL
BUN: 18
CO2: 29
Calcium: 9
Creatinine, Ser: 0.98
GFR calc Af Amer: >60
GFR calc non Af Amer: 54 — ABNORMAL LOW
Glucose, Bld: 95

## 2011-01-07 LAB — PROTIME-INR
INR: 2.5 — ABNORMAL HIGH
Prothrombin Time: 28.6 — ABNORMAL HIGH

## 2011-01-07 LAB — URINE MICROSCOPIC-ADD ON

## 2011-01-07 LAB — POCT CARDIAC MARKERS
CKMB, poc: 2.1
CKMB, poc: 2.7
Myoglobin, poc: 67.2

## 2011-01-08 ENCOUNTER — Ambulatory Visit
Admission: RE | Admit: 2011-01-08 | Discharge: 2011-01-08 | Disposition: A | Discharge status: Home / Self Care | Payer: Medicare Other | Source: Ambulatory Visit | Attending: Radiation Oncology | Admitting: Radiation Oncology

## 2011-01-08 DIAGNOSIS — C493 Malignant neoplasm of connective and soft tissue of thorax: Secondary | ICD-10-CM | POA: Insufficient documentation

## 2011-01-08 DIAGNOSIS — C50919 Malignant neoplasm of unspecified site of unspecified female breast: Secondary | ICD-10-CM | POA: Insufficient documentation

## 2011-01-10 ENCOUNTER — Other Ambulatory Visit (HOSPITAL_COMMUNITY): Payer: Self-pay | Admitting: Interventional Radiology

## 2011-01-16 ENCOUNTER — Ambulatory Visit (INDEPENDENT_AMBULATORY_CARE_PROVIDER_SITE_OTHER): Payer: Medicare Other | Admitting: General Surgery

## 2011-01-16 ENCOUNTER — Encounter (INDEPENDENT_AMBULATORY_CARE_PROVIDER_SITE_OTHER): Payer: Self-pay | Admitting: General Surgery

## 2011-01-16 VITALS — BP 140/76 | HR 64 | Temp 98.4°F | Resp 16 | Ht 62.0 in | Wt 101.4 lb

## 2011-01-16 DIAGNOSIS — C493 Malignant neoplasm of connective and soft tissue of thorax: Secondary | ICD-10-CM

## 2011-01-16 NOTE — Patient Instructions (Signed)
Cover chest wound with gauze

## 2011-01-16 NOTE — Progress Notes (Signed)
Subjective:     Patient ID: Veronica Ellison, female   DOB: May 30, 1921, 77 y.o.   MRN: [id number]  HPI This is was excision local and regional recurrence of sarcoma right chest wall and right axilla. Her axillary wound is doing well. Her chest wall wound opened up. She has been doing gauze dressings there.  Review of Systems     Objective:   Physical Exam    Axillary wound is well-healed. There is no signs of infection. Chest wall wound has opened. This was cleaned out. It had a small amount of fibrin. There is no evidence of infection. Dry gauze packing and coverage was done Assessment:     Status post excision of regional recurrence of spindle cell sarcoma right axilla and more local recurrence of spindle cell sarcoma right chest wall. Both initial mass pathology had positive margins, however the additional margins taken were clear.   Plan:     followup with Dr. Dayton Scrape from radiation oncology for planned treatment. Wound care instructions were givenI see her back in 2 weeks for wound check.Marland Kitchen

## 2011-01-17 ENCOUNTER — Other Ambulatory Visit: Payer: Self-pay | Admitting: Family Medicine

## 2011-01-17 DIAGNOSIS — IMO0002 Reserved for concepts with insufficient information to code with codable children: Secondary | ICD-10-CM

## 2011-01-24 ENCOUNTER — Ambulatory Visit
Admission: RE | Admit: 2011-01-24 | Discharge: 2011-01-24 | Disposition: A | Discharge status: Home / Self Care | Payer: Medicare Other | Source: Ambulatory Visit | Attending: Family Medicine | Admitting: Family Medicine

## 2011-01-24 DIAGNOSIS — IMO0002 Reserved for concepts with insufficient information to code with codable children: Secondary | ICD-10-CM

## 2011-01-24 MED ORDER — METHYLPREDNISOLONE ACETATE 40 MG/ML INJ SUSP (RADIOLOG
120.0000 mg | Freq: Once | INTRAMUSCULAR | Status: AC
  Administered 2011-01-24: 120 mg via EPIDURAL

## 2011-01-24 MED ORDER — IOHEXOL 180 MG/ML  SOLN
1.0000 mL | Freq: Once | INTRAMUSCULAR | Status: AC | PRN
  Administered 2011-01-24: 1 mL via EPIDURAL

## 2011-01-24 NOTE — Patient Instructions (Signed)

## 2011-01-30 ENCOUNTER — Ambulatory Visit (INDEPENDENT_AMBULATORY_CARE_PROVIDER_SITE_OTHER): Payer: Medicare Other | Admitting: General Surgery

## 2011-01-30 ENCOUNTER — Encounter (INDEPENDENT_AMBULATORY_CARE_PROVIDER_SITE_OTHER): Payer: Self-pay | Admitting: General Surgery

## 2011-01-30 VITALS — BP 160/62 | HR 60 | Temp 97.3°F | Resp 20 | Ht 62.0 in | Wt 102.2 lb

## 2011-01-30 DIAGNOSIS — C493 Malignant neoplasm of connective and soft tissue of thorax: Secondary | ICD-10-CM

## 2011-01-30 NOTE — Progress Notes (Signed)
Subjective:     Patient ID: Veronica Ellison, female   DOB: 08/07/1921, 77 y.o.   MRN: [id number]  HPI  Patient presents status post resection of local and regional recurrence of sarcoma  right chest wall and right axilla. She is feeling better. Her daughter is helping her with wet-to-dry dressings on the chest wall wound. The axillary wound has healed. She is going to see Dr. Dayton Scrape from radiation oncology this Monday. She recently underwent an injection for chronic back pain and that has helped significantly. Review of Systems     Objective:   Physical Exam    The axillary wound is completely healed. There is no nodularity or signs of infection. The chest wall wound is less than 1 cm in size. It is a clean granulation bed. Dry dressing was placed. There is no evidence of infection. Assessment:     Improving status post resection of local and regional recurrences of sarcoma right chest wall and axilla    Plan:   Continued wound care. The patient is planning to scale back her narcotic pain medication and I do agree that his pain. She will be evaluated by Dr. Dayton Scrape for adjuvant XRT. I'll see her back in 3 weeks.

## 2011-01-30 NOTE — Patient Instructions (Signed)
Continue wound care. ?

## 2011-02-01 ENCOUNTER — Encounter (HOSPITAL_BASED_OUTPATIENT_CLINIC_OR_DEPARTMENT_OTHER): Payer: Medicare Other | Admitting: Oncology

## 2011-02-01 DIAGNOSIS — Z452 Encounter for adjustment and management of vascular access device: Secondary | ICD-10-CM

## 2011-02-01 DIAGNOSIS — C496 Malignant neoplasm of connective and soft tissue of trunk, unspecified: Secondary | ICD-10-CM

## 2011-02-06 ENCOUNTER — Other Ambulatory Visit: Payer: Self-pay | Admitting: Family Medicine

## 2011-02-06 ENCOUNTER — Encounter (INDEPENDENT_AMBULATORY_CARE_PROVIDER_SITE_OTHER): Payer: Medicare Other | Admitting: General Surgery

## 2011-02-06 DIAGNOSIS — M549 Dorsalgia, unspecified: Secondary | ICD-10-CM

## 2011-02-20 ENCOUNTER — Encounter (INDEPENDENT_AMBULATORY_CARE_PROVIDER_SITE_OTHER): Payer: Self-pay | Admitting: General Surgery

## 2011-02-20 ENCOUNTER — Ambulatory Visit (INDEPENDENT_AMBULATORY_CARE_PROVIDER_SITE_OTHER): Payer: Medicare Other | Admitting: General Surgery

## 2011-02-20 VITALS — BP 120/82 | HR 60 | Temp 98.0°F | Resp 18 | Wt 100.5 lb

## 2011-02-20 DIAGNOSIS — C493 Malignant neoplasm of connective and soft tissue of thorax: Secondary | ICD-10-CM

## 2011-02-20 NOTE — Progress Notes (Signed)
Subjective:     Patient ID: Veronica Ellison, female   DOB: [birth year], 77 y.o.   MRN: [id number]  HPI Patient is status post excision of local and regional recurrence of right chest wall sarcoma. Her axillary area has healed. She is not getting any further radiation treatment. She is continuing wound care on her chest wall site as that continues to healing.  Review of Systems     Objective:   Physical Exam Axillary wound remains healed. The area is soft. Right chest wall area has a 1 cm x 8 mm opening. There were some fibrin at the base of the wound which was cleaned away revealing clean granulation tissue. Small gauze dressing was replaced. The surrounding area is stable on its exam    Assessment:     Status post resection of local and regional recurrence of soft tissue sarcoma right chest wall    Plan:     Continue wound care and return in 4 weeks

## 2011-02-20 NOTE — Patient Instructions (Signed)
Continue gauze dressing daily

## 2011-02-27 ENCOUNTER — Other Ambulatory Visit: Payer: Self-pay | Admitting: Family Medicine

## 2011-02-27 DIAGNOSIS — M545 Low back pain: Secondary | ICD-10-CM

## 2011-03-05 ENCOUNTER — Ambulatory Visit
Admission: RE | Admit: 2011-03-05 | Discharge: 2011-03-05 | Disposition: A | Discharge status: Home / Self Care | Payer: Medicare Other | Source: Ambulatory Visit | Attending: Family Medicine | Admitting: Family Medicine

## 2011-03-05 DIAGNOSIS — M545 Low back pain: Secondary | ICD-10-CM

## 2011-03-05 MED ORDER — METHYLPREDNISOLONE ACETATE 40 MG/ML INJ SUSP (RADIOLOG
120.0000 mg | Freq: Once | INTRAMUSCULAR | Status: AC
  Administered 2011-03-05: 120 mg via EPIDURAL

## 2011-03-05 MED ORDER — IOHEXOL 180 MG/ML  SOLN
1.0000 mL | Freq: Once | INTRAMUSCULAR | Status: AC | PRN
  Administered 2011-03-05: 1 mL via EPIDURAL

## 2011-03-05 NOTE — Patient Instructions (Signed)

## 2011-03-06 ENCOUNTER — Ambulatory Visit
Admission: RE | Admit: 2011-03-06 | Discharge: 2011-03-06 | Disposition: A | Discharge status: Home / Self Care | Payer: Medicare Other | Source: Ambulatory Visit | Attending: Radiation Oncology | Admitting: Radiation Oncology

## 2011-03-06 ENCOUNTER — Encounter: Payer: Self-pay | Admitting: Radiation Oncology

## 2011-03-06 VITALS — BP 137/74 | HR 69 | Temp 97.8°F | Resp 18 | Wt 101.1 lb

## 2011-03-06 DIAGNOSIS — C493 Malignant neoplasm of connective and soft tissue of thorax: Secondary | ICD-10-CM

## 2011-03-06 NOTE — Progress Notes (Signed)
HAD STEROID INJECTION IN BACK X 2 SINCE HERE LAST.  SAYS THAT SHE HAS A LITTLE "CRUSTY " PLACE WHERE TX WAS DONE

## 2011-03-06 NOTE — Progress Notes (Signed)
Follow up Note:   Veronica Ellison is seen today for a brief followup after noting some "crusting" along her right thoracotomy scar. She saw [name] approximately 2 weeks ago and he saw no evidence for recurrent disease. She tells me she'll see Dr. Gaylyn Rong in late December.  On examination today there is a superficial cross to measure 1.0 cm along her mid thoracotomy scar. This does not have the appearance of recurrent disease. Her 1 cm wound continues to granulate and is without evidence for infection or recurrent disease. There is no palpable or visible evidence for recurrent disease along her right chest wall or right axilla.  Impression probable trauma to her right thoracotomy scar with scab formation. I do not think that this represents recurrent disease.  Plan: She was given Aquaphor to use when necessary. Followup visit with me in 3 months.

## 2011-03-08 ENCOUNTER — Other Ambulatory Visit: Payer: Self-pay | Admitting: Radiation Oncology

## 2011-03-20 ENCOUNTER — Ambulatory Visit (INDEPENDENT_AMBULATORY_CARE_PROVIDER_SITE_OTHER): Payer: Medicare Other | Admitting: General Surgery

## 2011-03-20 ENCOUNTER — Encounter (INDEPENDENT_AMBULATORY_CARE_PROVIDER_SITE_OTHER): Payer: Self-pay | Admitting: General Surgery

## 2011-03-20 VITALS — BP 128/64 | HR 64 | Temp 97.4°F | Resp 16 | Ht 62.0 in | Wt 96.6 lb

## 2011-03-20 DIAGNOSIS — C493 Malignant neoplasm of connective and soft tissue of thorax: Secondary | ICD-10-CM

## 2011-03-20 NOTE — Progress Notes (Signed)
Subjective:     Patient ID: Veronica Ellison, female   DOB: 10-Jan-1922, 77 y.o.   MRN: [id number]  HPI Patient is status post local and regional excision of recurrent right chest wall sarcoma. Her axillary incision that is healed. She is having a delayed healing of the posterior right chest wall area likely due to previous radiation treatments. She is putting a dry dressing on it daily. She has no other new complaints.  Review of Systems     Objective:   Physical Exam  Cardiovascular: Normal heart sounds.   Pulmonary/Chest: Effort normal and breath sounds normal. No respiratory distress.  Right chest wall wound is now less than a centimeter. There's no evidence of infection. Her previous resection area has unchanged scar tissue. I did not feel any nodularities. Her axillary incision remains healed. There is no nodularity or other abnormality in the area.     Assessment:     Gradual healing after excision of recurrent sarcoma    Plan:     Continue wound care and I'll see her back next month

## 2011-03-26 ENCOUNTER — Other Ambulatory Visit: Payer: Self-pay | Admitting: Family Medicine

## 2011-03-26 DIAGNOSIS — M545 Low back pain: Secondary | ICD-10-CM

## 2011-03-27 ENCOUNTER — Encounter: Payer: Self-pay | Admitting: *Deleted

## 2011-03-28 ENCOUNTER — Ambulatory Visit
Admission: RE | Admit: 2011-03-28 | Discharge: 2011-03-28 | Disposition: A | Discharge status: Home / Self Care | Payer: Medicare Other | Source: Ambulatory Visit | Attending: Family Medicine | Admitting: Family Medicine

## 2011-03-28 DIAGNOSIS — M545 Low back pain: Secondary | ICD-10-CM

## 2011-03-28 MED ORDER — IOHEXOL 180 MG/ML  SOLN
1.0000 mL | Freq: Once | INTRAMUSCULAR | Status: AC | PRN
  Administered 2011-03-28: 1 mL via EPIDURAL

## 2011-03-28 MED ORDER — METHYLPREDNISOLONE ACETATE 40 MG/ML INJ SUSP (RADIOLOG
120.0000 mg | Freq: Once | INTRAMUSCULAR | Status: AC
  Administered 2011-03-28: 120 mg via EPIDURAL

## 2011-04-04 ENCOUNTER — Ambulatory Visit (HOSPITAL_BASED_OUTPATIENT_CLINIC_OR_DEPARTMENT_OTHER): Payer: Medicare Other | Admitting: Oncology

## 2011-04-04 ENCOUNTER — Other Ambulatory Visit (HOSPITAL_BASED_OUTPATIENT_CLINIC_OR_DEPARTMENT_OTHER): Payer: Medicare Other

## 2011-04-04 ENCOUNTER — Telehealth: Payer: Self-pay | Admitting: Oncology

## 2011-04-04 VITALS — BP 161/76 | HR 62 | Temp 97.0°F | Ht 62.0 in | Wt 103.0 lb

## 2011-04-04 DIAGNOSIS — C493 Malignant neoplasm of connective and soft tissue of thorax: Secondary | ICD-10-CM

## 2011-04-04 DIAGNOSIS — C499 Malignant neoplasm of connective and soft tissue, unspecified: Secondary | ICD-10-CM

## 2011-04-04 LAB — COMPREHENSIVE METABOLIC PANEL
BUN: 21 mg/dL (ref 6–23)
CO2: 28 mEq/L (ref 19–32)
Calcium: 9.2 mg/dL (ref 8.4–10.5)
Chloride: 103 mEq/L (ref 96–112)
Creatinine, Ser: 0.79 mg/dL (ref 0.50–1.10)
Glucose, Bld: 91 mg/dL (ref 70–99)
Total Bilirubin: 0.3 mg/dL (ref 0.3–1.2)

## 2011-04-04 LAB — CBC WITH DIFFERENTIAL/PLATELET
BASO%: 1.5 % (ref 0.0–2.0)
Basophils Absolute: 0.1 10*3/uL (ref 0.0–0.1)
Eosinophils Absolute: 0.3 10*3/uL (ref 0.0–0.5)
HCT: 37.9 % (ref 34.8–46.6)
HGB: 12.7 g/dL (ref 11.6–15.9)
LYMPH%: 19.4 % (ref 14.0–49.7)
MCHC: 33.4 g/dL (ref 31.5–36.0)
MONO#: 0.8 10*3/uL (ref 0.1–0.9)
NEUT%: 61.3 % (ref 38.4–76.8)
Platelets: 270 10*3/uL (ref 145–400)
WBC: 6.5 10*3/uL (ref 3.9–10.3)
lymph#: 1.3 10*3/uL (ref 0.9–3.3)

## 2011-04-04 NOTE — Telephone Encounter (Signed)
appts made for 07/2011 and printed for pt,also Inetta Fermo from IR at [id number] set pt up for port removal for 04/11/11 at 12:00 and she will call the pt with instructions     aom

## 2011-04-04 NOTE — Progress Notes (Signed)
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  Pathologic stage pT2 cN0 M0.  She had another recurrence outside of the previous field of radiation; and had resection on February 16, 2010 with same pathology but positive margin.  She is also s/p adjuvant XRT for each time she had resection.   She was started on 06/26/10 with adjuvant chemotherapy with continuous infusion of Adriamycin/Cytoxan over 7 days q4 wks.  There was plan for at least 5 cycles.  However, she developed recurrent mucositis, worsening performance status, and unrelated problem of severe back pain from degenerative joint disease.  Therefore, adjuvant chemo was discontinued after two cycles.  She had been on watchful observation until she developed local recurrent disease in August 2012.  She underwent repeat resection on December 27, 2010.  She was deemed not be be candidate for repeat radiation.   CURRENT THERAPY:  watchful observation.   INTERVAL HISTORY: Veronica Ellison 77 y.o. female returns for regular follow up with her daughter.  She still has lower back pain.  She had some relief temporarily with steroid injection a few weeks ago.  She still requires Percocet about 4 times a day to be able to function.  She denies lower extremity weakness, bowel/bladder incontinence.  With regard to her right posterior chest wall sarcoma, the wound still has a bout 1x0.5cm open lesion with occasional clear discharge without pain, palpable mass, purulent discharge.  She has mild fatigue since she is taking care of her husband with dementia.  She has stable appetite; however, she has not regained much of the weight loss from last year.    Patient denies headache, visual changes,  confusion, drenching night sweats, palpable lymph node swelling, mucositis, odynophagia, dysphagia, nausea vomiting, jaundice, chest pain, palpitation, shortness of breath, dyspnea on exertion, productive cough, gum bleeding, epistaxis, hematemesis, hemoptysis, abdominal pain, abdominal swelling, early satiety, melena, hematochezia, hematuria, skin rash, spontaneous bleeding, joint swelling, joint pain, heat or cold intolerance, bowel bladder incontinence, back pain, focal motor weakness, paresthesia, depression, suicidal or homocidal ideation, feeling hopelessness.   MEDICAL HISTORY: Past Medical History  Diagnosis Date  . Breast cancer 1996    (Lt) breast ca dx 1996  . 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 Hyperlipidemia   . A-fib   . Arthritis   . IBD (inflammatory bowel disease)   . Gallstones     SURGICAL HISTORY:  Past Surgical History  Procedure Date  . Mastectomy 1996  . Abdominal hysterectomy 1999  . Bowel resection 1999  . Cholecystectomy 2005  . 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 Sarcoma excision 12/27/10    rt axillary and back    MEDICATIONS: Current Outpatient Prescriptions  Medication Sig Dispense Refill  . acetaminophen (TYLENOL) 325 MG tablet Take 650 mg by mouth every 6 (six) hours as needed.        Marland Kitchen alendronate (FOSAMAX) 70 MG tablet Take 70 mg by mouth every 7 (seven) days. Take with a full glass of water on an empty stomach.       Marland Kitchen amiodarone (PACERONE) 200 MG tablet Take 200 mg by mouth daily.        Marland Kitchen  aspirin 325 MG tablet Take 325 mg by mouth daily.        . Calcium Carbonate-Vitamin D (CALTRATE 600+D PO) Take by mouth 2 (two) times daily.        . cycloSPORINE (RESTASIS) 0.05 % ophthalmic emulsion Apply 1 drop to eye 2 (two) times daily.        . diphenoxylate-atropine (LOMOTIL) 2.5-0.025 MG per tablet Take 1 tablet by mouth 4 (four) times daily as needed.        . docusate  sodium (COLACE) 100 MG capsule Take 100 mg by mouth 2 (two) times daily.        Marland Kitchen loperamide (IMODIUM) 2 MG capsule Take 2 mg by mouth every 8 (eight) hours as needed.        . Multiple Vitamin (MULTIVITAMIN) capsule Take 1 capsule by mouth daily.        . nitroGLYCERIN (NITROSTAT) 0.4 MG SL tablet Place 0.4 mg under the tongue as needed.        Marland Kitchen omeprazole (PRILOSEC) 20 MG capsule Take 20 mg by mouth daily.        . ondansetron (ZOFRAN) 4 MG tablet Take 4 mg by mouth every 6 (six) hours as needed.        Marland Kitchen oxyCODONE-acetaminophen (PERCOCET) 5-325 MG per tablet Ad lib.      Marland Kitchen prochlorperazine (COMPAZINE) 10 MG tablet Take 10 mg by mouth every 6 (six) hours as needed.          ALLERGIES:  is allergic to amoxicillin; demerol; macrodantin; adhesive; ampicillin; meperidine and related; morphine and related; nitrofurantoin; oxycodone; sulfa antibiotics; and tetracyclines & related.  REVIEW OF SYSTEMS:  The rest of the 14-point review of system was negative.   Filed Vitals:   04/04/11 1047  BP: 161/76  Pulse: 62  Temp: 97 F (36.1 C)   Wt Readings from Last 3 Encounters:  04/04/11 103 lb (46.72 kg)  01/08/11 103 lb 1.6 oz (46.766 kg)  03/20/11 96 lb 9.6 oz (43.817 kg)   ECOG Performance status: 1  PHYSICAL EXAMINATION:   General:  thin-appearing woman in no acute distress.  Eyes:  no scleral icterus.  ENT:  There were no oropharyngeal lesions.  Neck was without thyromegaly.  Lymphatics:  Negative cervical, supraclavicular or axillary adenopathy.  Respiratory: lungs were clear bilaterally without wheezing or crackles.  Cardiovascular:  Regular rate and rhythm, S1/S2, without murmur, rub or gallop.  There was no pedal edema.  GI:  abdomen was soft, flat, nontender, nondistended, without organomegaly.  Muscoloskeletal:  no spinal tenderness of palpation of vertebral spine.  Her chest wall on the right side showed incisional wound, dry, healed, intact.  The lower one in the right posterior chest  wall had some serous drainage and measured about 1x0.5 cm in length; however, no purulent discharge, pain, or erythema.  I could not palpate a mass anywhere else on her exam today.Skin exam was without echymosis, petichae.  Neuro exam was nonfocal.  Patient was able to get on and off exam table without assistance.  Gait was normal.  Patient was alerted and oriented.  Attention was good.   Language was appropriate.  Mood was normal without depression.  Speech was not pressured.  Thought content was not tangential.    LABORATORY/RADIOLOGY DATA:  Lab Results  Component Value Date   WBC 6.5 04/04/2011   HGB 12.7 04/04/2011   HCT 37.9 04/04/2011   PLT 270 04/04/2011   GLUCOSE 97 01/03/2011   GLUCOSE 97  01/03/2011   CHOL  Value: 232        ATP III CLASSIFICATION:  <200     mg/dL   Desirable  960-454  mg/dL   Borderline High  >=098    mg/dL   High       * 11/91/4782   TRIG 105 04/05/2010   HDL 74 04/05/2010   LDLCALC  Value: 137        Total Cholesterol/HDL:CHD Risk Coronary Heart Disease Risk Table                     Men   Women  1/2 Average Risk   3.4   3.3  Average Risk       5.0   4.4  2 X Average Risk   9.6   7.1  3 X Average Risk  23.4   11.0        Use the calculated Patient Ratio above and the CHD Risk Table to determine the patient's CHD Risk.        ATP III CLASSIFICATION (LDL):  <100     mg/dL   Optimal  956-213  mg/dL   Near or Above                    Optimal  130-159  mg/dL   Borderline  086-578  mg/dL   High  >469     mg/dL   Very High* 62/95/2841   ALT 12 01/03/2011   ALT 12 01/03/2011   AST 20 01/03/2011   AST 20 01/03/2011   NA 140 01/03/2011   NA 140 01/03/2011   K 4.8 01/03/2011   K 4.8 01/03/2011   CL 105 01/03/2011   CL 105 01/03/2011   CREATININE 0.89 01/03/2011   CREATININE 0.89 01/03/2011   BUN 19 01/03/2011   BUN 19 01/03/2011   CO2 25 01/03/2011   CO2 25 01/03/2011   TSH 0.978 07/10/2010   INR 0.96 12/03/2010    Dg Epidurography  03/28/2011  *RADIOLOGY REPORT*  Clinical Data:  Lumbosacral spondylosis without myelopathy  LUMBAR EPIDURAL  Procedure: The procedure, risks, benefits, and alternatives were explained to the patient. Questions regarding the procedure were encouraged and answered. The patient understands and consents to the procedure.  LUMBAR EPIDURAL INJECTION: An interlaminar approach was performed on the left at L5-S1. The overlying skin was cleansed and anesthetized.  A 20 gauge Crawford epidural needle was advanced using loss-of-resistance technique.  DIAGNOSTIC EPIDURAL INJECTION: Injection of Omnipaque 180 shows a good epidural pattern with spread above and below the level of needle placement, primarily on the left.  No vascular opacification is seen.  THERAPEUTIC EPIDURAL INJECTION: 120 mg of Depo-Medrol mixed with 4 ml 1% lidocaine were instilled.  The procedure was well-tolerated, and the patient was discharged thirty minutes following the injection in good condition.  Complications: None.  Fluoroscopy time: 18 seconds.  IMPRESSION: Technically successful epidural injection on the left at L5 S1.  Original Report Authenticated By: Donavan Burnet, M.D.    ASSESSMENT AND PLAN:    1. History of recurrent sarcoma.  I discussed with Ms. Slatten and her daughter that there is no evidence of recurrent sarcoma.  As she is not a candidate for IV chemo due to her age and being slightly frail, I referred her back to IR to have portcath removed.  If she were to develop metastastic disease, my recommendation would most likely be palliative, symptom-directed therapy.  I therefore do  not advocate routine CXR for surveillance of lung met unless she has focal, concerning symptoms.  2. Anemia secondary to most likely recent resection per her information.  Resolved.  3. Osteoarthritis, degenerative joint disease, and sacral compression fracture.  She will follow up with Dr. Corliss Skains.  She is on Percocet.  4. Atrial fibrillation.  She is on amiodarone per cardiologist.  She is off  of Coumadin now due to risk of bleeding with recurrent sarcoma.  5. History of osteoporosis.  She is on alendronate per PCP. 6. Follow up:  With me in about 6 months.  She also has follow up with Rad onc and Gen Surg.

## 2011-04-08 ENCOUNTER — Other Ambulatory Visit: Payer: Self-pay | Admitting: Radiology

## 2011-04-10 ENCOUNTER — Telehealth: Payer: Self-pay | Admitting: *Deleted

## 2011-04-10 NOTE — Telephone Encounter (Signed)
Daughter called to report had temp of 99.9F last night and her normal temp is usually 97.32F which it is this morning.  She denies any respiratory symptoms, but states pt feels more tired than usual. She asks if they should r/s pt's PAC removal scheduled for tomorrow.  Called [facility] And was instructed to have family call in am if pt has any more temps or signs of infection.  Instructed Misty Stanley to call if any further temps or symptoms today or in am to r/s the removal.  She verbalized understanding.

## 2011-04-11 ENCOUNTER — Ambulatory Visit (HOSPITAL_COMMUNITY)
Admission: RE | Admit: 2011-04-11 | Discharge: 2011-04-11 | Disposition: A | Discharge status: Home / Self Care | Payer: Medicare Other | Source: Ambulatory Visit | Attending: Oncology | Admitting: Oncology

## 2011-04-11 VITALS — BP 143/63 | HR 67 | Resp 35

## 2011-04-11 DIAGNOSIS — Z79899 Other long term (current) drug therapy: Secondary | ICD-10-CM | POA: Insufficient documentation

## 2011-04-11 DIAGNOSIS — Z853 Personal history of malignant neoplasm of breast: Secondary | ICD-10-CM | POA: Insufficient documentation

## 2011-04-11 DIAGNOSIS — Z9089 Acquired absence of other organs: Secondary | ICD-10-CM | POA: Insufficient documentation

## 2011-04-11 DIAGNOSIS — E785 Hyperlipidemia, unspecified: Secondary | ICD-10-CM | POA: Insufficient documentation

## 2011-04-11 DIAGNOSIS — Z9010 Acquired absence of unspecified breast and nipple: Secondary | ICD-10-CM | POA: Insufficient documentation

## 2011-04-11 DIAGNOSIS — Z452 Encounter for adjustment and management of vascular access device: Secondary | ICD-10-CM | POA: Insufficient documentation

## 2011-04-11 DIAGNOSIS — I4891 Unspecified atrial fibrillation: Secondary | ICD-10-CM | POA: Insufficient documentation

## 2011-04-11 DIAGNOSIS — C493 Malignant neoplasm of connective and soft tissue of thorax: Secondary | ICD-10-CM | POA: Insufficient documentation

## 2011-04-11 LAB — CBC
Hemoglobin: 10.9 g/dL — ABNORMAL LOW (ref 12.0–15.0)
MCH: 31.7 pg (ref 26.0–34.0)
MCV: 98 fL (ref 78.0–100.0)
Platelets: 231 10*3/uL (ref 150–400)
RBC: 3.44 MIL/uL — ABNORMAL LOW (ref 3.87–5.11)
WBC: 7.4 10*3/uL (ref 4.0–10.5)

## 2011-04-11 LAB — BASIC METABOLIC PANEL
CO2: 25 mEq/L (ref 19–32)
Calcium: 9.1 mg/dL (ref 8.4–10.5)
Chloride: 103 mEq/L (ref 96–112)
Glucose, Bld: 101 mg/dL — ABNORMAL HIGH (ref 70–99)
Potassium: 3.8 mEq/L (ref 3.5–5.1)
Sodium: 137 mEq/L (ref 135–145)

## 2011-04-11 MED ORDER — SODIUM CHLORIDE 0.9 % IV SOLN
INTRAVENOUS | Status: DC
  Administered 2011-04-11: 250 mL via INTRAVENOUS

## 2011-04-11 MED ORDER — FENTANYL CITRATE 0.05 MG/ML IJ SOLN
INTRAMUSCULAR | Status: AC | PRN
  Administered 2011-04-11 (×2): 50 ug via INTRAVENOUS

## 2011-04-11 MED ORDER — VANCOMYCIN HCL IN DEXTROSE 1-5 GM/200ML-% IV SOLN
1000.0000 mg | Freq: Once | INTRAVENOUS | Status: AC
  Administered 2011-04-11: 1000 mg via INTRAVENOUS
  Filled 2011-04-11 (×2): qty 200

## 2011-04-11 MED ORDER — DIPHENHYDRAMINE HCL 50 MG/ML IJ SOLN
25.0000 mg | Freq: Once | INTRAMUSCULAR | Status: AC
  Administered 2011-04-11: 50 mg via INTRAVENOUS

## 2011-04-11 MED ORDER — MIDAZOLAM HCL 5 MG/5ML IJ SOLN
INTRAMUSCULAR | Status: AC | PRN
  Administered 2011-04-11 (×2): 1 mg via INTRAVENOUS

## 2011-04-11 MED ORDER — DIPHENHYDRAMINE HCL 50 MG/ML IJ SOLN
INTRAMUSCULAR | Status: AC
  Filled 2011-04-11: qty 1

## 2011-04-11 NOTE — Procedures (Signed)
Successful RT IJ port removal No comp Stable
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
## 2011-04-17 ENCOUNTER — Encounter (INDEPENDENT_AMBULATORY_CARE_PROVIDER_SITE_OTHER): Payer: Self-pay | Admitting: General Surgery

## 2011-04-17 ENCOUNTER — Ambulatory Visit (INDEPENDENT_AMBULATORY_CARE_PROVIDER_SITE_OTHER): Payer: Medicare Other | Admitting: General Surgery

## 2011-04-17 VITALS — BP 132/78 | HR 70 | Temp 97.2°F | Resp 16 | Ht 61.0 in | Wt 102.2 lb

## 2011-04-17 DIAGNOSIS — C493 Malignant neoplasm of connective and soft tissue of thorax: Secondary | ICD-10-CM

## 2011-04-17 NOTE — Progress Notes (Signed)
Subjective:     Patient ID: Veronica Ellison, female   DOB: 08/30/1921, 78 y.o.   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 She underwent resection of an axillary metastasis as well. She continues to have chronic pain issues. She has not noticed any new problems along her chest wall. She keeps a close eye on the scar tissue there.   Review of Systems     Objective:   Physical Exam Right axillary wound is well-healed without nodularity or issues. Small excision site on her right chest wall is almost completely closed. There is still a dimple in the skin but it is nearly re\re epithelialized. She continues to have a ridge of scar tissue above and below her previous excision site on her chest wall. This is stable on examination compared with previous. 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 This should happen within a week or 2. I would like to see her back to see if there is any change in the scar tissue along the chest wall. She's had many recurrences here and we need to follow this closely.

## 2011-05-29 ENCOUNTER — Other Ambulatory Visit (INDEPENDENT_AMBULATORY_CARE_PROVIDER_SITE_OTHER): Payer: Self-pay | Admitting: General Surgery

## 2011-05-29 ENCOUNTER — Ambulatory Visit (INDEPENDENT_AMBULATORY_CARE_PROVIDER_SITE_OTHER): Payer: Medicare Other | Admitting: General Surgery

## 2011-05-29 ENCOUNTER — Encounter (INDEPENDENT_AMBULATORY_CARE_PROVIDER_SITE_OTHER): Payer: Self-pay | Admitting: General Surgery

## 2011-05-29 VITALS — BP 158/70 | HR 72 | Temp 98.0°F | Resp 18 | Wt 102.0 lb

## 2011-05-29 DIAGNOSIS — C493 Malignant neoplasm of connective and soft tissue of thorax: Secondary | ICD-10-CM

## 2011-05-29 NOTE — Progress Notes (Signed)
Subjective:     Patient ID: Veronica Ellison, female   DOB: Nov 18, 1921, 78 y.o.   MRN: [id number]  HPI Patient presented for followup of sarcoma of right chest wall. She has undergone excision of regional and local metastases. Her latest chest wall area has healed. She noted an area near her spine and one on her right back which are new sore nodule areas. Most of the soreness is in the right back nodule.  Review of Systems     Objective:   Physical Exam Just to the right of the patient's spine lateral to her scarring from multiple excisions there is a 2 cm flat nodular area. There is a 1 cm mildly tender nodular area or inferior and lateral to this first one. 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 We will await results and see her back next week.

## 2011-06-05 ENCOUNTER — Ambulatory Visit (INDEPENDENT_AMBULATORY_CARE_PROVIDER_SITE_OTHER): Payer: Medicare Other | Admitting: General Surgery

## 2011-06-05 ENCOUNTER — Encounter: Payer: Self-pay | Admitting: Radiation Oncology

## 2011-06-05 ENCOUNTER — Ambulatory Visit
Admission: RE | Admit: 2011-06-05 | Discharge: 2011-06-05 | Disposition: A | Discharge status: Home / Self Care | Payer: Medicare Other | Source: Ambulatory Visit | Attending: Radiation Oncology | Admitting: Radiation Oncology

## 2011-06-05 ENCOUNTER — Encounter (INDEPENDENT_AMBULATORY_CARE_PROVIDER_SITE_OTHER): Payer: Self-pay | Admitting: General Surgery

## 2011-06-05 VITALS — BP 128/80 | HR 66 | Temp 97.6°F | Resp 18 | Wt 101.4 lb

## 2011-06-05 VITALS — BP 151/68 | HR 62 | Temp 97.1°F | Resp 18 | Wt 102.3 lb

## 2011-06-05 DIAGNOSIS — C493 Malignant neoplasm of connective and soft tissue of thorax: Secondary | ICD-10-CM

## 2011-06-05 HISTORY — DX: Unspecified fracture of left wrist and hand, initial encounter for closed fracture: S62.92XA

## 2011-06-05 NOTE — Progress Notes (Signed)
Followup note:  Ms. 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 I last saw Veronica Ellison approximately 3 months ago at which time there was no visible or palpable evidence for recurrent disease. She does have a small scab which I thought was secondary to trauma. She is being followed closely by [name] and he recently performed 80 respiration biopsy of 2 new nodules along the right paraspinal region. These were reported to be benign. She sees [name] again on March 7, and Dr. Gaylyn Rong on April 12. She is not having any chest wall discomfort but she does take tramadol for her low back pain. Of note is that she fractured her left wrist almost 2 months ago, and we'll see Dr. Valentina Gu for followup later today.  Physical examination: On inspection of the right chest wall there are surgical and radiation changes with telangiectasia as expected. Her right chest wall reexcision wound is healed. There is no visible or palpable evidence for recurrent disease although there are 2 poorly defined slightly elevated masses along the right paravertebral region which were recently biopsied. These are not mobile. Her left wrist is in a splint.  Impression: No definitive evidence for recurrent disease. She is certainly at high risk for another recurrence along her right chest wall. I'm not sure what to make an of these 2 new slightly raised right paravertebral masses, but [name] will keep close followup. These appear to be away from her previous radiation therapy fields.  Plan: She'll maintain close followup with [name], and also see Dr. Gaylyn Rong. I've not scheduled the patient for a formal followup visit, but I would be more than happy to see her in the future should the need arise.

## 2011-06-05 NOTE — Progress Notes (Signed)
Subjective:     Patient ID: Veronica Ellison, female   DOB: June 11, 1921, 78 y.o.   MRN: [id number]  HPI Patient has a history of right chest wall sarcoma with local and regional recurrences. I did fine needle aspiration of the 2 lesions on her back last week. Pathology for both came back with no evidence of malignant cells. She's had no change in her clinical status since then.  Review of Systems     Objective:   Physical Exam One lesion by her spine is about 3cm across there are no complicating features from the aspiration. The other lesion lower and more lateral on the right back is 1.5 cm in size. There's no signs of infection    Assessment:     Aspiration of 2 suspicious areas showed no malignancy.    Plan:     I would like to recheck these areas next month to see if there are any changes.

## 2011-06-05 NOTE — Progress Notes (Signed)
Patient presents to the clinic today accompanied by her daughter for a follow up appointment with Dr. Dayton Scrape. Patient is alert and oriented to person, place, and time. No distress noted. Steady gait noted with assistance of rolling walker. Pleasant affect noted. Patient denies pain at this time. Patient reports the tramadol she is taking makes her "loopy." Patient reports that her port a cath was removed on 04/11/2011. Patient saw [name] this morning for a follow up to a biopsy done last week. Patient reports that the biopsy revealed "no bad cells." patient broke her hand and is scheduled to follow up with her orthopedist today at 1350. Reported all findings to Dr. Dayton Scrape.

## 2011-07-03 ENCOUNTER — Encounter (INDEPENDENT_AMBULATORY_CARE_PROVIDER_SITE_OTHER): Payer: Self-pay | Admitting: General Surgery

## 2011-07-03 ENCOUNTER — Other Ambulatory Visit (INDEPENDENT_AMBULATORY_CARE_PROVIDER_SITE_OTHER): Payer: Self-pay | Admitting: General Surgery

## 2011-07-03 ENCOUNTER — Other Ambulatory Visit (HOSPITAL_COMMUNITY)
Admission: RE | Admit: 2011-07-03 | Discharge: 2011-07-03 | Disposition: A | Discharge status: Home / Self Care | Payer: Medicare Other | Source: Ambulatory Visit | Attending: General Surgery | Admitting: General Surgery

## 2011-07-03 ENCOUNTER — Ambulatory Visit (INDEPENDENT_AMBULATORY_CARE_PROVIDER_SITE_OTHER): Payer: Medicare Other | Admitting: General Surgery

## 2011-07-03 VITALS — BP 150/76 | HR 71 | Temp 98.2°F | Ht 62.0 in | Wt 101.4 lb

## 2011-07-03 DIAGNOSIS — Z8589 Personal history of malignant neoplasm of other organs and systems: Secondary | ICD-10-CM

## 2011-07-03 DIAGNOSIS — C493 Malignant neoplasm of connective and soft tissue of thorax: Secondary | ICD-10-CM

## 2011-07-03 DIAGNOSIS — R229 Localized swelling, mass and lump, unspecified: Secondary | ICD-10-CM | POA: Insufficient documentation

## 2011-07-03 DIAGNOSIS — D492 Neoplasm of unspecified behavior of bone, soft tissue, and skin: Secondary | ICD-10-CM

## 2011-07-03 NOTE — Progress Notes (Signed)
Subjective:     Patient ID: Veronica Ellison, female   DOB: 27-Jan-1922, 78 y.o.   MRN: [id number]  HPI Patient presents for followup of a sarcoma of right chest wall. She had 2 masses which were needle one overlying her spine and one in the right back. Fine-needle aspiration of both were negative. She feels the one on her back is less prominent but the one near her spine is larger.  Review of Systems     Objective:   Physical Exam Indeed the mass overlying her spine is more distinct and a little bit larger. The other one is difficult to locate as it is much smaller. I did a fine-needle aspiration of the mass over her spine. She tolerated this well.    Assessment:     Mass of her spine with history of sarcoma right chest wall    Plan:     Fine-needle aspiration was done as above, we will await results and plan accordingly.

## 2011-07-24 ENCOUNTER — Ambulatory Visit (INDEPENDENT_AMBULATORY_CARE_PROVIDER_SITE_OTHER): Payer: Medicare Other | Admitting: General Surgery

## 2011-07-24 ENCOUNTER — Encounter (INDEPENDENT_AMBULATORY_CARE_PROVIDER_SITE_OTHER): Payer: Self-pay | Admitting: General Surgery

## 2011-07-24 VITALS — BP 142/74 | HR 72 | Temp 98.3°F | Resp 16 | Ht 61.5 in | Wt 102.0 lb

## 2011-07-24 DIAGNOSIS — C493 Malignant neoplasm of connective and soft tissue of thorax: Secondary | ICD-10-CM

## 2011-07-24 NOTE — Patient Instructions (Signed)
Our schedulers will call to set up surgery

## 2011-07-24 NOTE — Progress Notes (Signed)
Addended by: Liz Malady on: 07/24/2011 01:58 PM   Modules accepted: Orders
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
## 2011-07-26 ENCOUNTER — Encounter (HOSPITAL_COMMUNITY): Payer: Self-pay | Admitting: Pharmacy Technician

## 2011-07-30 ENCOUNTER — Telehealth (INDEPENDENT_AMBULATORY_CARE_PROVIDER_SITE_OTHER): Payer: Self-pay

## 2011-07-30 NOTE — Telephone Encounter (Signed)
The 4/30 labs should be fine

## 2011-07-30 NOTE — Telephone Encounter (Signed)
I would think those labs will be fine

## 2011-07-30 NOTE — Telephone Encounter (Signed)
The patient's daughter called and would like to know if the labs that Dr Gaylyn Rong is drawing tomorrow can be used for her preop labs.  Surgery is 5/6 and the preadmission visit is 4/30.  Dr Gaylyn Rong is drawing a cbc and cmet.  I called the cancer center and Dorene Grebe states that anesthesia will probably want new labs because they will be over 38 days old but I can ask the surgeon.

## 2011-07-31 ENCOUNTER — Ambulatory Visit (HOSPITAL_COMMUNITY)
Admission: RE | Admit: 2011-07-31 | Discharge: 2011-07-31 | Disposition: A | Discharge status: Home / Self Care | Payer: Medicare Other | Source: Ambulatory Visit | Attending: Oncology | Admitting: Oncology

## 2011-07-31 ENCOUNTER — Ambulatory Visit (HOSPITAL_BASED_OUTPATIENT_CLINIC_OR_DEPARTMENT_OTHER): Payer: Medicare Other | Admitting: Oncology

## 2011-07-31 ENCOUNTER — Other Ambulatory Visit: Payer: Medicare Other | Admitting: Lab

## 2011-07-31 ENCOUNTER — Telehealth: Payer: Self-pay | Admitting: *Deleted

## 2011-07-31 VITALS — BP 163/81 | HR 73 | Temp 97.1°F | Ht 61.5 in | Wt 102.3 lb

## 2011-07-31 DIAGNOSIS — I4891 Unspecified atrial fibrillation: Secondary | ICD-10-CM

## 2011-07-31 DIAGNOSIS — M810 Age-related osteoporosis without current pathological fracture: Secondary | ICD-10-CM

## 2011-07-31 DIAGNOSIS — M47817 Spondylosis without myelopathy or radiculopathy, lumbosacral region: Secondary | ICD-10-CM

## 2011-07-31 DIAGNOSIS — C493 Malignant neoplasm of connective and soft tissue of thorax: Secondary | ICD-10-CM

## 2011-07-31 LAB — COMPREHENSIVE METABOLIC PANEL
Alkaline Phosphatase: 74 U/L (ref 39–117)
BUN: 17 mg/dL (ref 6–23)
CO2: 28 mEq/L (ref 19–32)
Creatinine, Ser: 0.81 mg/dL (ref 0.50–1.10)
Glucose, Bld: 80 mg/dL (ref 70–99)
Total Bilirubin: 0.3 mg/dL (ref 0.3–1.2)

## 2011-07-31 LAB — CBC WITH DIFFERENTIAL/PLATELET
Basophils Absolute: 0 10*3/uL (ref 0.0–0.1)
EOS%: 3.7 % (ref 0.0–7.0)
Eosinophils Absolute: 0.2 10*3/uL (ref 0.0–0.5)
HGB: 12.1 g/dL (ref 11.6–15.9)
LYMPH%: 22.5 % (ref 14.0–49.7)
MCH: 33 pg (ref 25.1–34.0)
MCV: 98 fL (ref 79.5–101.0)
MONO%: 9 % (ref 0.0–14.0)
NEUT#: 2.9 10*3/uL (ref 1.5–6.5)
Platelets: 192 10*3/uL (ref 145–400)

## 2011-07-31 NOTE — Telephone Encounter (Signed)
Spoke w/ pt's daughter and informed her CXR normal per Dr. Gaylyn Rong.  She is with pt now and will share results.  She verbalized understanding.

## 2011-07-31 NOTE — Telephone Encounter (Signed)
Message copied by Wende Mott on Wed Jul 31, 2011  3:22 PM ------      Message from: HA, Raliegh Ip T      Created: Wed Jul 31, 2011  1:49 PM       Please call pt.  Her CXR today was clear.  Thanks.

## 2011-07-31 NOTE — Telephone Encounter (Signed)
I clarified with Dr Janee Morn that he says the labs Dr Gaylyn Rong is drawing today will be sufficient for surgery.  She is having a cbc and cmet.  I notified the daughter Rosalie Doctor.

## 2011-07-31 NOTE — Progress Notes (Signed)
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 Pathologic stage pT2 cN0 M0. She had another recurrence outside of the previous field of radiation; and had resection on February 16, 2010 with same pathology but positive margin. She is also s/p adjuvant XRT for each time she had resection. She was started on 06/26/10 with adjuvant chemotherapy with continuous infusion of Adriamycin/Cytoxan over 7 days q4 wks. There was plan for at least 5 cycles. However, she developed recurrent mucositis, worsening performance status, and unrelated problem of severe back pain from degenerative joint disease. Therefore, adjuvant chemo was discontinued after two cycles. She had been on watchful observation until she developed local recurrent disease in August 2012. She underwent repeat resection on December 27, 2010. She was deemed not be be candidate for repeat radiation.   CURRENT THERAPY: watchful observation.   INTERVAL HISTORY: Veronica Ellison 78 y.o. female returns for regular follow up.  [name] recently found a progressing mass paraspinal the the upper lumbar area.  Biopsy in March 2013 was nondiagnostic; however, it has been progressing in size.  Thus, she is due to have this ressected in the near future. She also has been having worsening left hip pain.  She had epidural block by Dr. Bonnielee Haff from IR in December 2012 with some relief.  This pain has come back.  Pain is described as crampy, shooting, burning.  It is worse with activities.  She has only partial relief with Oxycodone/APAP and Ultram.  She has no weakness in the legs or bowll/bladder incontinence.  She has mild fatigue; however,  she is still independent of activities of daily living.   Patient denies headache, visual changes, confusion, drenching night sweats, palpable lymph node swelling, mucositis, odynophagia, dysphagia, nausea vomiting, jaundice, chest pain, palpitation, shortness of breath, dyspnea on exertion, productive cough, gum bleeding, epistaxis, hematemesis, hemoptysis, abdominal pain, abdominal swelling, early satiety, melena, hematochezia, hematuria, skin rash, spontaneous bleeding, joint swelling, joint pain, heat or cold intolerance, focal motor weakness, paresthesia, depression, suicidal or homocidal ideation, feeling hopelessness.   Past Medical History  Diagnosis Date  . Breast cancer 1996    (Lt) breast ca dx 1996  . 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 Hyperlipidemia   . A-fib   . Arthritis   . IBD (inflammatory bowel disease)   . Gallstones   . Fracture of left hand     Past Surgical History  Procedure Date  . Mastectomy 1996  . Abdominal hysterectomy 1999  . Bowel resection 1999  . Cholecystectomy 2005  . 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 Sarcoma excision 12/27/10    rt axillary and back    Current Outpatient Prescriptions  Medication Sig Dispense Refill  . alendronate (FOSAMAX) 70 MG tablet Take 70 mg by mouth every 7 (seven) days. Take with a full glass of water on an empty stomach.      Marland Kitchen amiodarone (PACERONE) 200 MG tablet Take 200 mg by mouth daily.       Marland Kitchen aspirin 325 MG tablet Take 325 mg by mouth every evening.       Marland Kitchen  Calcium Carbonate-Vitamin D (CALTRATE 600+D PO) Take 1 tablet by mouth 2 (two) times daily.       . cycloSPORINE (RESTASIS) 0.05 % ophthalmic emulsion Place 1 drop into both eyes 2 (two) times daily.       Marland Kitchen docusate sodium (COLACE) 100 MG capsule Take 100 mg by mouth 2 (two) times daily.       . Multiple Vitamins-Iron (MULTIVITAMINS WITH IRON) TABS Take 1 tablet by mouth daily.       . nitroGLYCERIN (NITROSTAT) 0.4 MG SL tablet Place 0.4 mg under the tongue every 5 (five) minutes as needed. For Chest pain      . omeprazole (PRILOSEC) 20 MG capsule Take 20 mg by mouth daily.       . ondansetron (ZOFRAN) 4 MG tablet Take 4 mg by mouth every 6 (six) hours as needed. For nausea      . oxyCODONE-acetaminophen (PERCOCET) 5-325 MG per tablet Take 1 tablet by mouth every 4 (four) hours.       . prochlorperazine (COMPAZINE) 10 MG tablet Take 10 mg by mouth every 6 (six) hours as needed. For nausea      . traMADol (ULTRAM) 50 MG tablet Take 50 mg by mouth every 6 (six) hours as needed. For pain      . triamcinolone cream (KENALOG) 0.1 % Apply 1 application topically as needed. To itchy skin      . diphenoxylate-atropine (LOMOTIL) 2.5-0.025 MG per tablet Take 1 tablet by mouth 4 (four) times daily as needed. For loose stools      . loperamide (IMODIUM) 2 MG capsule Take 2 mg by mouth every 8 (eight) hours as needed. For loose stools        ALLERGIES:  is allergic to amoxicillin; chocolate; peanut-containing drug products; adhesive; ampicillin; meperidine and related; morphine and related; nitrofurantoin; sulfa antibiotics; and tetracyclines & related.  REVIEW OF SYSTEMS:  The rest of the 14-point review of system was negative.   Filed Vitals:   07/31/11 1040  BP: 163/81  Pulse: 73  Temp: 97.1 F (36.2 C)   Wt Readings from Last 3 Encounters:  07/31/11 102 lb 4.8 oz (46.403 kg)  07/24/11 102 lb (46.267 kg)  07/03/11 101 lb 6.4 oz (45.995 kg)   ECOG Performance status: 1  PHYSICAL EXAMINATION:   General:  Thin-appearing woman in no acute distress.  Eyes:  no scleral icterus.  ENT:  There were no oropharyngeal lesions.  Neck was without thyromegaly.  Lymphatics:  Negative cervical, supraclavicular or axillary adenopathy.  Respiratory: lungs were clear bilaterally without wheezing or crackles.  Cardiovascular:  Regular rate and rhythm, S1/S2, without murmur, rub or gallop.   There was no pedal edema.  GI:  abdomen was soft, flat, nontender, nondistended, without organomegaly.  Muscoloskeletal:  no spinal tenderness of palpation of vertebral spine. There was a 3x2cm right paralumbar spine mass; nontender to palpation.  I could not identify any abnormal mass along her right chest wall resection scar since there was all fibrosis.  Skin exam was without echymosis, petichae.  Neuro exam was nonfocal.  Patient was able to get on and off exam table without assistance.  Gait was normal.  Patient was alerted and oriented.  Attention was good.   Language was appropriate.  Mood was normal without depression.  Speech was not pressured.  Thought content was not tangential.         LABORATORY/RADIOLOGY DATA:  Lab Results  Component Value Date   WBC 4.5 07/31/2011  HGB 12.1 07/31/2011   HCT 35.9 07/31/2011   PLT 192 07/31/2011   GLUCOSE 101* 04/11/2011   CHOL  Value: 232        ATP III CLASSIFICATION:  <200     mg/dL   Desirable  811-914  mg/dL   Borderline High  >=782    mg/dL   High       * 95/62/1308   TRIG 105 04/05/2010   HDL 74 04/05/2010   LDLCALC  Value: 137        Total Cholesterol/HDL:CHD Risk Coronary Heart Disease Risk Table                     Men   Women  1/2 Average Risk   3.4   3.3  Average Risk       5.0   4.4  2 X Average Risk   9.6   7.1  3 X Average Risk  23.4   11.0        Use the calculated Patient Ratio above and the CHD Risk Table to determine the patient's CHD Risk.        ATP III CLASSIFICATION (LDL):  <100     mg/dL   Optimal  657-846  mg/dL   Near or Above                    Optimal  130-159  mg/dL   Borderline  962-952  mg/dL   High  >841     mg/dL   Very High* 32/44/0102   ALKPHOS 82 04/04/2011   ALT 21 04/04/2011   AST 20 04/04/2011   NA 137 04/11/2011   K 3.8 04/11/2011   CL 103 04/11/2011   CREATININE 0.64 04/11/2011   BUN 14 04/11/2011   CO2 25 04/11/2011   INR 0.93 04/11/2011    ASSESSMENT AND PLAN:   1.  History of recurrent sarcoma. There is a new  paraspinal mass outside of the field of radiation.  Despite nondiagnostic biopsy, this mass has increased in size.  I concur with Dr. Carollee Massed decision to excise in OR.  If it turns out to be sarcoma again, I may refer her back to Dr. Dayton Scrape to consider adjuvant radiation again.   2.  Anemia secondary to most likely recent resection per her information. Resolved.   3.  Osteoarthritis, degenerative joint disease, and sacral compression fracture. She is on Percocet and Ultram.  I advised her to talk with her PCP to see if there is a role for gabapentin.  If pain worsens, she may consider f/u with Dr. Bonnielee Haff from IR for consideration of repeating nerve block procedure.   4.  Atrial fibrillation. She is on amiodarone per cardiologist. She is off of Coumadin now due to risk of bleeding with recurrent sarcoma.   5.  History of osteoporosis. She is on alendronate per PCP.   6.  Follow up: in about 4 months.      The length of time of the face-to-face encounter was 15 minutes. More than 50% of time was spent counseling and coordination of care.

## 2011-08-02 ENCOUNTER — Telehealth (INDEPENDENT_AMBULATORY_CARE_PROVIDER_SITE_OTHER): Payer: Self-pay | Admitting: General Surgery

## 2011-08-02 NOTE — Telephone Encounter (Signed)
Daughter calling for Veronica Ellison.  Explained she is not available today.  Daughter states Veronica Ellison was going to call the Short Stay to let them know that the labs from Dr. Gaylyn Rong would be acceptable for her pre-op.  When pre-adm called for appt, "they had no record of this."  Wants a call made to Short Stay, please.

## 2011-08-05 NOTE — Telephone Encounter (Signed)
I called and spoke to [facility] and informed her that I spoke to [place] on 4/24 and she would not take my verbal order to use her labs but I had to put a note in Epic.  I put that in my phone note.  Inocencio Homes states she is the one who called the pt to set up a preadmitting appt.  She did get the message though that the patient had labs drawn already.  Mrs Badami does not need to go for a preadmitting appt.  I notified the pt.

## 2011-08-06 ENCOUNTER — Other Ambulatory Visit (HOSPITAL_COMMUNITY): Payer: Medicare Other

## 2011-08-09 ENCOUNTER — Encounter (HOSPITAL_COMMUNITY): Payer: Self-pay

## 2011-08-11 MED ORDER — VANCOMYCIN HCL IN DEXTROSE 1-5 GM/200ML-% IV SOLN
1000.0000 mg | INTRAVENOUS | Status: AC
  Administered 2011-08-12: 1000 mg via INTRAVENOUS

## 2011-08-12 ENCOUNTER — Ambulatory Visit (HOSPITAL_COMMUNITY): Payer: Medicare Other | Admitting: Anesthesiology

## 2011-08-12 ENCOUNTER — Encounter (HOSPITAL_COMMUNITY): Payer: Self-pay | Admitting: Anesthesiology

## 2011-08-12 ENCOUNTER — Encounter (HOSPITAL_COMMUNITY): Payer: Self-pay | Admitting: *Deleted

## 2011-08-12 ENCOUNTER — Encounter (HOSPITAL_COMMUNITY)
Admission: RE | Disposition: A | Discharge status: Home / Self Care | Payer: Self-pay | Source: Ambulatory Visit | Attending: General Surgery

## 2011-08-12 ENCOUNTER — Ambulatory Visit (HOSPITAL_COMMUNITY)
Admission: RE | Admit: 2011-08-12 | Discharge: 2011-08-12 | Disposition: A | Discharge status: Home / Self Care | Payer: Medicare Other | Source: Ambulatory Visit | Attending: General Surgery | Admitting: General Surgery

## 2011-08-12 DIAGNOSIS — I4891 Unspecified atrial fibrillation: Secondary | ICD-10-CM | POA: Insufficient documentation

## 2011-08-12 DIAGNOSIS — C493 Malignant neoplasm of connective and soft tissue of thorax: Secondary | ICD-10-CM

## 2011-08-12 DIAGNOSIS — C44599 Other specified malignant neoplasm of skin of other part of trunk: Secondary | ICD-10-CM | POA: Insufficient documentation

## 2011-08-12 DIAGNOSIS — D213 Benign neoplasm of connective and other soft tissue of thorax: Secondary | ICD-10-CM

## 2011-08-12 DIAGNOSIS — C496 Malignant neoplasm of connective and soft tissue of trunk, unspecified: Secondary | ICD-10-CM

## 2011-08-12 DIAGNOSIS — E785 Hyperlipidemia, unspecified: Secondary | ICD-10-CM | POA: Insufficient documentation

## 2011-08-12 HISTORY — DX: Anemia, unspecified: D64.9

## 2011-08-12 HISTORY — DX: Encounter for other specified aftercare: Z51.89

## 2011-08-12 HISTORY — DX: Pneumonia, unspecified organism: J18.9

## 2011-08-12 HISTORY — DX: Reserved for concepts with insufficient information to code with codable children: IMO0002

## 2011-08-12 HISTORY — DX: Gastro-esophageal reflux disease without esophagitis: K21.9

## 2011-08-12 HISTORY — DX: Headache: R51

## 2011-08-12 HISTORY — DX: Urinary tract infection, site not specified: N39.0

## 2011-08-12 HISTORY — PX: MASS EXCISION: SHX2000

## 2011-08-12 HISTORY — DX: Peripheral vascular disease, unspecified: I73.9

## 2011-08-12 HISTORY — DX: Cerebral infarction, unspecified: I63.9

## 2011-08-12 HISTORY — DX: Reserved for inherently not codable concepts without codable children: IMO0001

## 2011-08-12 HISTORY — DX: Dizziness and giddiness: R42

## 2011-08-12 LAB — CBC
HCT: 38.4 % (ref 36.0–46.0)
Hemoglobin: 12.5 g/dL (ref 12.0–15.0)
MCH: 31.6 pg (ref 26.0–34.0)
MCV: 97 fL (ref 78.0–100.0)
RBC: 3.96 MIL/uL (ref 3.87–5.11)

## 2011-08-12 LAB — SURGICAL PCR SCREEN
MRSA, PCR: NEGATIVE
Staphylococcus aureus: NEGATIVE

## 2011-08-12 LAB — BASIC METABOLIC PANEL
BUN: 19 mg/dL (ref 6–23)
CO2: 28 mEq/L (ref 19–32)
Chloride: 103 mEq/L (ref 96–112)
GFR calc non Af Amer: 75 mL/min — ABNORMAL LOW (ref >90)
Glucose, Bld: 91 mg/dL (ref 70–99)
Potassium: 4.7 mEq/L (ref 3.5–5.1)
Sodium: 141 mEq/L (ref 135–145)

## 2011-08-12 SURGERY — EXCISION MASS
Anesthesia: General | Site: Back | Laterality: Right | Wound class: Clean

## 2011-08-12 MED ORDER — FENTANYL CITRATE 0.05 MG/ML IJ SOLN
INTRAMUSCULAR | Status: DC | PRN
  Administered 2011-08-12: 100 ug via INTRAVENOUS

## 2011-08-12 MED ORDER — ONDANSETRON HCL 4 MG/2ML IJ SOLN: 4.0000 mg | Freq: Four times a day (QID) | INTRAMUSCULAR | Status: DC | PRN

## 2011-08-12 MED ORDER — OXYCODONE-ACETAMINOPHEN 5-325 MG PO TABS: 1.0000 | ORAL_TABLET | Freq: Four times a day (QID) | ORAL | Status: AC | PRN

## 2011-08-12 MED ORDER — HYDROMORPHONE HCL PF 1 MG/ML IJ SOLN
0.2500 mg | INTRAMUSCULAR | Status: DC | PRN
  Administered 2011-08-12: 0.25 mg via INTRAVENOUS

## 2011-08-12 MED ORDER — LACTATED RINGERS IV SOLN
INTRAVENOUS | Status: DC | PRN
  Administered 2011-08-12: 12:00:00 via INTRAVENOUS

## 2011-08-12 MED ORDER — ROCURONIUM BROMIDE 100 MG/10ML IV SOLN
INTRAVENOUS | Status: DC | PRN
  Administered 2011-08-12: 40 mg via INTRAVENOUS

## 2011-08-12 MED ORDER — MUPIROCIN 2 % EX OINT
TOPICAL_OINTMENT | CUTANEOUS | Status: AC
  Filled 2011-08-12: qty 22

## 2011-08-12 MED ORDER — MUPIROCIN 2 % EX OINT
TOPICAL_OINTMENT | Freq: Once | CUTANEOUS | Status: AC
  Administered 2011-08-12: 11:00:00 via NASAL
  Filled 2011-08-12: qty 22

## 2011-08-12 MED ORDER — BUPIVACAINE-EPINEPHRINE 0.5% -1:200000 IJ SOLN
INTRAMUSCULAR | Status: DC | PRN
  Administered 2011-08-12: 8 mL
  Administered 2011-08-12: 10 mL

## 2011-08-12 MED ORDER — SODIUM CHLORIDE 0.9 % IV SOLN
INTRAVENOUS | Status: DC | PRN
  Administered 2011-08-12: 12:00:00 via INTRAVENOUS

## 2011-08-12 MED ORDER — GLYCOPYRROLATE 0.2 MG/ML IJ SOLN
INTRAMUSCULAR | Status: DC | PRN
  Administered 2011-08-12: 0.4 mg via INTRAVENOUS

## 2011-08-12 MED ORDER — NEOSTIGMINE METHYLSULFATE 1 MG/ML IJ SOLN
INTRAMUSCULAR | Status: DC | PRN
  Administered 2011-08-12: 3 mg via INTRAVENOUS

## 2011-08-12 MED ORDER — LIDOCAINE HCL (CARDIAC) 20 MG/ML IV SOLN
INTRAVENOUS | Status: DC | PRN
  Administered 2011-08-12: 50 mg via INTRAVENOUS

## 2011-08-12 MED ORDER — ONDANSETRON HCL 4 MG/2ML IJ SOLN
INTRAMUSCULAR | Status: DC | PRN
  Administered 2011-08-12: 4 mg via INTRAVENOUS

## 2011-08-12 MED ORDER — PROPOFOL 10 MG/ML IV EMUL
INTRAVENOUS | Status: DC | PRN
  Administered 2011-08-12: 130 mg via INTRAVENOUS

## 2011-08-12 MED ORDER — 0.9 % SODIUM CHLORIDE (POUR BTL) OPTIME
TOPICAL | Status: DC | PRN
  Administered 2011-08-12: 1000 mL

## 2011-08-12 SURGICAL SUPPLY — 44 items
ADH SKN CLS APL DERMABOND .7 (GAUZE/BANDAGES/DRESSINGS) ×4
BLADE SURG 10 STRL SS (BLADE) ×2 IMPLANT
BLADE SURG 15 STRL LF DISP TIS (BLADE) ×1 IMPLANT
BLADE SURG 15 STRL SS (BLADE) ×2
CANISTER SUCTION 2500CC (MISCELLANEOUS) IMPLANT
CHLORAPREP W/TINT 26ML (MISCELLANEOUS) ×2 IMPLANT
CLEANER TIP ELECTROSURG 2X2 (MISCELLANEOUS) ×2 IMPLANT
CLOTH BEACON ORANGE TIMEOUT ST (SAFETY) ×2 IMPLANT
COVER SURGICAL LIGHT HANDLE (MISCELLANEOUS) ×2 IMPLANT
DECANTER SPIKE VIAL GLASS SM (MISCELLANEOUS) ×2 IMPLANT
DERMABOND ADVANCED (GAUZE/BANDAGES/DRESSINGS) ×4
DERMABOND ADVANCED .7 DNX12 (GAUZE/BANDAGES/DRESSINGS) IMPLANT
DRAPE LAPAROTOMY T 102X78X121 (DRAPES) ×2 IMPLANT
DRAPE UTILITY 15X26 W/TAPE STR (DRAPE) ×4 IMPLANT
ELECT REM PT RETURN 9FT ADLT (ELECTROSURGICAL) ×2
ELECTRODE REM PT RTRN 9FT ADLT (ELECTROSURGICAL) ×1 IMPLANT
GAUZE SPONGE 4X4 16PLY XRAY LF (GAUZE/BANDAGES/DRESSINGS) IMPLANT
GLOVE BIO SURGEON STRL SZ8 (GLOVE) ×2 IMPLANT
GLOVE BIOGEL PI IND STRL 8 (GLOVE) ×1 IMPLANT
GLOVE BIOGEL PI INDICATOR 8 (GLOVE) ×1
GOWN SRG XL XLNG 56XLVL 4 (GOWN DISPOSABLE) ×1 IMPLANT
GOWN STRL NON-REIN LRG LVL3 (GOWN DISPOSABLE) ×2 IMPLANT
GOWN STRL NON-REIN XL XLG LVL4 (GOWN DISPOSABLE) ×2
KIT BASIN OR (CUSTOM PROCEDURE TRAY) ×2 IMPLANT
KIT ROOM TURNOVER OR (KITS) ×2 IMPLANT
NDL HYPO 25X1 1.5 SAFETY (NEEDLE) ×1 IMPLANT
NEEDLE HYPO 25X1 1.5 SAFETY (NEEDLE) ×2 IMPLANT
NS IRRIG 1000ML POUR BTL (IV SOLUTION) ×2 IMPLANT
PACK SURGICAL SETUP 50X90 (CUSTOM PROCEDURE TRAY) ×2 IMPLANT
PAD ARMBOARD 7.5X6 YLW CONV (MISCELLANEOUS) ×4 IMPLANT
PENCIL BUTTON HOLSTER BLD 10FT (ELECTRODE) ×2 IMPLANT
SPECIMEN JAR SMALL (MISCELLANEOUS) ×2 IMPLANT
SPONGE GAUZE 4X4 12PLY (GAUZE/BANDAGES/DRESSINGS) IMPLANT
SPONGE LAP 18X18 X RAY DECT (DISPOSABLE) ×2 IMPLANT
STRIP CLOSURE SKIN 1/2X4 (GAUZE/BANDAGES/DRESSINGS) IMPLANT
SUT MNCRL AB 4-0 PS2 18 (SUTURE) ×3 IMPLANT
SUT VIC AB 3-0 SH 27 (SUTURE) ×4
SUT VIC AB 3-0 SH 27X BRD (SUTURE) ×1 IMPLANT
SYR BULB 3OZ (MISCELLANEOUS) ×2 IMPLANT
SYR CONTROL 10ML LL (SYRINGE) ×2 IMPLANT
TOWEL OR 17X24 6PK STRL BLUE (TOWEL DISPOSABLE) ×2 IMPLANT
TOWEL OR 17X26 10 PK STRL BLUE (TOWEL DISPOSABLE) ×2 IMPLANT
TUBE CONNECTING 12X1/4 (SUCTIONS) IMPLANT
YANKAUER SUCT BULB TIP NO VENT (SUCTIONS) IMPLANT

## 2011-08-12 NOTE — Interval H&P Note (Signed)
History and Physical Interval Note:  08/12/2011 11:37 AM  Veronica Ellison  has presented today for surgery, with the diagnosis of history of sarcoma  The various methods of treatment have been discussed with the patient and family. After consideration of risks, benefits and other options for treatment, the patient has consented to  Procedure(s) (LRB): EXCISION MASS (Right) as a surgical intervention .  The patients' history has been reviewed, patient re-examined and her site was marked, no change in status, stable for surgery.  I have reviewed the patients' chart and labs.  Questions were answered to the patient's satisfaction.     [name] E

## 2011-08-12 NOTE — Anesthesia Procedure Notes (Signed)
Procedure Name: Intubation Date/Time: 08/12/2011 11:55 AM Performed by: Julianne Rice K Pre-anesthesia Checklist: Emergency Drugs available, Suction available, Patient being monitored, Patient identified and Timeout performed Patient Re-evaluated:Patient Re-evaluated prior to inductionOxygen Delivery Method: Circle system utilized Preoxygenation: Pre-oxygenation with 100% oxygen Intubation Type: IV induction Ventilation: Mask ventilation without difficulty Laryngoscope Size: Miller and 2 Grade View: Grade II Tube type: Oral Tube size: 7.0 mm Number of attempts: 1 Airway Equipment and Method: Stylet Placement Confirmation: ETT inserted through vocal cords under direct vision,  breath sounds checked- equal and bilateral and positive ETCO2 Secured at: 22 cm Tube secured with: Tape Dental Injury: Teeth and Oropharynx as per pre-operative assessment

## 2011-08-12 NOTE — Transfer of Care (Signed)
Immediate Anesthesia Transfer of Care Note  Patient: Veronica Ellison  Procedure(s) Performed: Procedure(s) (LRB): EXCISION MASS (Right)  Patient Location: PACU  Anesthesia Type: General  Level of Consciousness: awake, alert  and oriented  Airway & Oxygen Therapy: Patient Spontanous Breathing and Patient connected to nasal cannula oxygen  Post-op Assessment: Report given to PACU RN and Post -op Vital signs reviewed and stable  Post vital signs: Reviewed and stable  Complications: No apparent anesthesia complications

## 2011-08-12 NOTE — Anesthesia Postprocedure Evaluation (Signed)
Anesthesia Post Note  Patient: Veronica Ellison  Procedure(s) Performed: Procedure(s) (LRB): EXCISION MASS (Right)  Anesthesia type: General  Patient location: PACU  Post pain: Pain level controlled and Adequate analgesia  Post assessment: Post-op Vital signs reviewed, Patient's Cardiovascular Status Stable, Respiratory Function Stable, Patent Airway and Pain level controlled  Last Vitals:  Filed Vitals:   08/12/11 1400  BP: 158/67  Pulse: 62  Temp: 36.7 C  Resp: 19    Post vital signs: Reviewed and stable  Level of consciousness: awake, alert  and oriented  Complications: No apparent anesthesia complications

## 2011-08-12 NOTE — Anesthesia Preprocedure Evaluation (Addendum)
Anesthesia Evaluation  Patient identified by MRN, date of birth, ID band Patient awake    Reviewed: Allergy & Precautions, H&P , NPO status , Patient's Chart, lab work & pertinent test results  Airway Mallampati: II  Neck ROM: full    Dental   Pulmonary          Cardiovascular + dysrhythmias Atrial Fibrillation     Neuro/Psych  Headaches, CVA    GI/Hepatic GERD-  Medicated and Controlled,  Endo/Other    Renal/GU      Musculoskeletal  (+) Arthritis -,   Abdominal   Peds  Hematology   Anesthesia Other Findings   Reproductive/Obstetrics                          Anesthesia Physical Anesthesia Plan  ASA: III  Anesthesia Plan: General   Post-op Pain Management:    Induction: Intravenous  Airway Management Planned: Oral ETT  Additional Equipment:   Intra-op Plan:   Post-operative Plan: Extubation in OR  Informed Consent: I have reviewed the patients History and Physical, chart, labs and discussed the procedure including the risks, benefits and alternatives for the proposed anesthesia with the patient or authorized representative who has indicated his/her understanding and acceptance.   Dental advisory given  Plan Discussed with: Anesthesiologist and Surgeon  Anesthesia Plan Comments:         Anesthesia Quick Evaluation

## 2011-08-12 NOTE — Progress Notes (Signed)
Last saw Dr. Eldridge Dace in jan 2013-doesn't go back for a year. Requested most recent office notes, EKG and any tests.

## 2011-08-12 NOTE — Preoperative (Signed)
Beta Blockers   Reason not to administer Beta Blockers:Not Applicable 

## 2011-08-12 NOTE — Op Note (Signed)
08/12/2011  12:41 PM  PATIENT:  Veronica Ellison  78 y.o. female  PRE-OPERATIVE DIAGNOSIS:  history of sarcoma  POST-OPERATIVE DIAGNOSIS:  history of sarcoma  PROCEDURE:  Procedure(s): EXCISION MASS RIGHT BACK PARASPINOUS REGION, EXCISION MASS RIGHT LATERAL CHEST WALL  SURGEON:  Surgeon(s): Liz Malady, MD  PHYSICIAN ASSISTANT:   ASSISTANTS: none   ANESTHESIA:   general  EBL:  Total I/O In: 250 [I.V.:250] Out: -   BLOOD ADMINISTERED:none  DRAINS: none   SPECIMEN:  Excision  DISPOSITION OF SPECIMEN:  PATHOLOGY  COUNTS:  YES  DICTATION: .Dragon Dictation patient has a history of spindle cell sarcoma right lateral chest wall with local and regional recurrences in the past. She developed a mass on her right back in the paraspinous region. Fine-needle aspiration cytology has been inconclusive. She is also developed some tender nodularity inferior to her chest wall scar. She presents today for removal of both of these areas. She was identified in the preop holding area. Her siteS were marked. Informed consent was obtained. She received intravenous antibiotics. She was brought to the operating room and general endotracheal anesthesia was administered by the anesthesia staff. She was placed in left side down lateral position. Back and chest were prepped and draped in sterile fashion. Time out procedure was done. Attention was first directed to the right paraspinous region. A soft subcutaneous mass was palpable. Longitudinal incision was made over this area. Subcutaneous tissues were dissected down. This mass was circumferentially dissected and removed. There was another small firm bit of tissue next to it which was also sent with the specimen. No other palpable abnormal tissue was present. I then took margin biopsies from the medial, lateral, inferior, and superior borders of the resection bed. All were sent to pathology. Wound was irrigated hemostasis was obtained. Local anesthetic was  injected. Wound is then closed with deep tissues closed with 3-0 Vicryl and the skin closed with 4-0 Monocryl followed by Dermabond. Attention was directed to the right lateral chest wall lesion. Transverse incision was made. Subcutaneous tissues were dissected down and around a palpable nodularity. This was excised. It was not distinct. Hemostasis was obtained. Local anesthetic was injected. Subcutaneous tissues were closed with running 3-0 Vicryl. Skin was closed with running 4-0 Monocryl followed by Dermabond. All counts were correct. Patient tolerated procedure well without apparent complication and was taken to the recovery room in stable condition.  PATIENT DISPOSITION:  PACU - hemodynamically stable.   Delay start of Pharmacological VTE agent (>24hrs) due to surgical blood loss or risk of bleeding:  no  Violeta Gelinas, MD, MPH, FACS Pager: [phone]  5/6/201312:41 PM

## 2011-08-12 NOTE — H&P (View-Only) (Signed)
Patient ID: Veronica Ellison, female   DOB: 01-25-1922, 78 y.o.   MRN: [id number]  Chief Complaint  Patient presents with  . Follow-up    HPI Veronica Ellison is a 78 y.o. female.   HPIPatient's history of spindle cell carcinoma right chest wall. She developed a new mass over her spine and another new mass lower right back. Fine-needle aspirations of both initially were benign. The lower lesion seemed to resolve. The lesion her spine has persisted and at another aspiration. This showed some crushed atypical cells but tissue sampling was not adequate. She also has developed a new area of soreness more anteriorly on her right chest wall.  Past Medical History  Diagnosis Date  . Breast cancer 1996    (Lt) breast ca dx 1996  . 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 Hyperlipidemia   . A-fib   . Arthritis   . IBD (inflammatory bowel disease)   . Gallstones   . Fracture of left hand     Past Surgical History  Procedure Date  . Mastectomy 1996  . Abdominal hysterectomy 1999  . Bowel resection 1999  . Cholecystectomy 2005  . 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 Sarcoma excision 12/27/10    rt axillary and back    Family History  Problem Relation Age of Onset  . Heart disease Mother   . Diabetes Mother   . Heart disease Father   . Kidney disease Father   . Hypertension Father   . Heart disease Sister   . Cancer Brother     prostate  . Heart disease Brother   . Cancer Son     pancreatic    Social History History  Substance Use Topics  . Smoking status: Never Smoker   . Smokeless tobacco: Never Used  . Alcohol Use: No    Allergies  Allergen Reactions  . Amoxicillin Diarrhea  . Demerol Itching    Sets her on fire  . Macrodantin Other (See Comments)    Drug fever  . Adhesive (Tape) Other (See Comments)    Tears skin  . Ampicillin Other (See Comments)    unknown  . Meperidine And Related Itching    . Morphine And Related Other (See Comments)    Hallucinations  . Nitrofurantoin Other (See Comments)    Drug fever  . Sulfa Antibiotics Rash  . Tetracyclines & Related Rash    Causes bumps    Current Outpatient Prescriptions  Medication Sig Dispense Refill  . acetaminophen (TYLENOL) 325 MG tablet Take 650 mg by mouth every 6 (six) hours as needed. pain      . alendronate (FOSAMAX) 70 MG tablet Take 70 mg by mouth every 7 (seven) days. Take with a full glass of water on an empty stomach.       Marland Kitchen amiodarone (PACERONE) 200 MG tablet Take 200 mg by mouth daily.        Marland Kitchen aspirin 325 MG tablet Take 325 mg by mouth daily.        . Calcium Carbonate-Vitamin D (CALTRATE 600+D PO) Take by mouth 2 (two) times daily.        . cycloSPORINE (RESTASIS) 0.05 % ophthalmic emulsion Apply 1 drop to eye 2 (two) times daily.        . diphenoxylate-atropine (LOMOTIL) 2.5-0.025 MG per tablet Take 1 tablet by mouth 4 (four) times daily as needed.        Marland Kitchen  docusate sodium (COLACE) 100 MG capsule Take 100 mg by mouth 2 (two) times daily.        Marland Kitchen loperamide (IMODIUM) 2 MG capsule Take 2 mg by mouth every 8 (eight) hours as needed. diarrhea      . Multiple Vitamin (MULTIVITAMIN) capsule Take 1 capsule by mouth daily.        . nitroGLYCERIN (NITROSTAT) 0.4 MG SL tablet Place 0.4 mg under the tongue as needed. Chest pain      . omeprazole (PRILOSEC) 20 MG capsule Take 20 mg by mouth daily.        . ondansetron (ZOFRAN) 4 MG tablet Take 4 mg by mouth every 6 (six) hours as needed.        Marland Kitchen oxyCODONE-acetaminophen (PERCOCET) 5-325 MG per tablet Ad lib.      Marland Kitchen prochlorperazine (COMPAZINE) 10 MG tablet Take 10 mg by mouth every 6 (six) hours as needed.        . traMADol (ULTRAM) 50 MG tablet       . triamcinolone cream (KENALOG) 0.1 %         Review of Systems Review of Systems  Blood pressure 142/74, pulse 72, temperature 98.3 F (36.8 C), temperature source Temporal, resp. rate 16, height 5' 1.5" (1.562 m),  weight 102 lb (46.267 kg).  Physical Exam Physical Exam  Cardiovascular: Regular rhythm and normal heart sounds.   Pulmonary/Chest: Effort normal and breath sounds normal. No respiratory distress. She has no wheezes.       Area over the right side of the spine similar as previous rubbery in about 2 cm but no tenderness,I cannot find the right lower back lesion at this time She has a new area of tenderness and nodularity anterior on right chest while at the anterior area of her old scar. This is tender mildly to palpation    Data Reviewed cytology  Assessment    Right paraspinous mass with history of spindle cell sarcoma, change in old scar right chest wall with history sarcoma    Plan    Excision paraspinous mass and biopsy of anterior right chest wall scar as an outpatient procedure. Procedure, risks, and benefits were discussed in detail with the patient and her daughter.  She will have to hold her aspirin 5 days before.       Lorita Forinash E 07/24/2011, 1:43 PM

## 2011-08-13 ENCOUNTER — Encounter (HOSPITAL_COMMUNITY): Payer: Self-pay | Admitting: General Surgery

## 2011-08-14 ENCOUNTER — Telehealth (INDEPENDENT_AMBULATORY_CARE_PROVIDER_SITE_OTHER): Payer: Self-pay | Admitting: General Surgery

## 2011-08-14 NOTE — Telephone Encounter (Signed)
I received a call from Dr. Luisa Hart from pathology with preliminary results. The paraspinous mass is consistent with spindle cell sarcoma. The right lateral chest wall tissue was benign. I called the patient and spoke with her and her daughter, and because to give her these results. We will have the final pathology result at her followup visit and further treatment options will be discussed at that time.

## 2011-08-20 ENCOUNTER — Telehealth: Payer: Self-pay | Admitting: Oncology

## 2011-08-20 NOTE — Telephone Encounter (Signed)
I reviewed her path from resection on 08/12/2011.  There was again recurrence of her sarcoma.  I will refer patient back to Dr. Dayton Scrape, Rad Onc to see if there is a role for repeat irradiation.

## 2011-08-26 ENCOUNTER — Encounter: Payer: Self-pay | Admitting: Radiation Oncology

## 2011-08-28 ENCOUNTER — Ambulatory Visit
Admission: RE | Admit: 2011-08-28 | Discharge: 2011-08-28 | Disposition: A | Discharge status: Home / Self Care | Payer: Medicare Other | Source: Ambulatory Visit | Attending: Radiation Oncology | Admitting: Radiation Oncology

## 2011-08-28 ENCOUNTER — Encounter: Payer: Self-pay | Admitting: Radiation Oncology

## 2011-08-28 VITALS — BP 126/73 | HR 74 | Temp 97.2°F | Resp 20 | Wt 100.9 lb

## 2011-08-28 DIAGNOSIS — C499 Malignant neoplasm of connective and soft tissue, unspecified: Secondary | ICD-10-CM

## 2011-08-28 DIAGNOSIS — C496 Malignant neoplasm of connective and soft tissue of trunk, unspecified: Secondary | ICD-10-CM | POA: Insufficient documentation

## 2011-08-28 DIAGNOSIS — M479 Spondylosis, unspecified: Secondary | ICD-10-CM | POA: Insufficient documentation

## 2011-08-28 DIAGNOSIS — IMO0002 Reserved for concepts with insufficient information to code with codable children: Secondary | ICD-10-CM | POA: Insufficient documentation

## 2011-08-28 DIAGNOSIS — Z510 Encounter for antineoplastic radiation therapy: Secondary | ICD-10-CM | POA: Insufficient documentation

## 2011-08-28 NOTE — Progress Notes (Signed)
Followup note:  Veronica Ellison returns today at the request of 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 I last saw the patient a fever 27 2013 at which time I noted to poorly defined slightly elevated masses along the right paravertebral region. [name] also noted a mass along her right paravertebral region in addition to some tender nodularity inferior to her lateral chest wall scar at the mid axillary line. She was taken to the or for excision of both of these regions on 08/12/2011. The right paravertebral lesion was excised with a specimen measuring 3 x 2 x 1.5 cm. [name] then took "margin biopsies" from the medial, lateral, inferior, and superior borders of the resection bed. The right lateral biopsy revealed focal involvement by sarcoma. This specimen measured 1 x 0.5 x 0.7 cm. The nodularity excised from the right lateral chest inferior to her scar was benign.  Physical examination: Alert and oriented. She is in good spirits.  Nodes: There is no palpable cervical, supraclavicular, or axillary lymphadenopathy. Chest: Surgical and radiation changes noted along the right chest wall. There is telangiectasia as expected. The right vertebral wound is well-healed with punctate nodularity felt related to sutures. The right lateral chest wall wound inferior to her scar and just along the border of telangiectasia is also well-healed. There are no new suspicious areas for recurrent disease. There are diminished breath sounds along the right lower lung zone.  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 The right lateral chest wall nodularity was found to be benign. I reviewed her pathology with Dr. Jimmy Picket and he is unable to tell me about the deep margin of resection for the paravertebral lesion or whether or not the final lateral margin is clear. In any case, the lateral margin appears to be close or  involved.  Plan: I left a message for [name] to call me tomorrow. Ideally, she should undergo reexcision or postoperative radiation therapy, or both. This area appears to be outside her previous radiation therapy field, so she could receive further radiation therapy with electrons. A less attractive option from an oncologic standpoint would be to simply follow her closely and then re-excise recurrent disease and give radiation therapy at that time. She would need to be followed very closely . A final recommendation will be made after discussing her case with [name].  30 minutes was spent face-to-face with the patient and her daughter, primarily counseling the patient and discussing management options.

## 2011-08-28 NOTE — Progress Notes (Signed)
Please see the Nurse Progress Note in the MD Initial Consult Encounter for this patient. 

## 2011-08-28 NOTE — Progress Notes (Signed)
Pt has chronic back pain due to DDD; takes Percocet, Ultram regularly.  Pt had excision 2 areas right back/right chest wall on 08/12/11. She states areas itch; she applies cream which gives temporary relief. She denies redness, rash, pain in these areas.

## 2011-08-28 NOTE — Progress Notes (Signed)
Encounter addended by: Glennie Hawk, RN on: 08/28/2011 12:50 PM<BR>     Documentation filed: Charges VN

## 2011-09-04 ENCOUNTER — Ambulatory Visit (INDEPENDENT_AMBULATORY_CARE_PROVIDER_SITE_OTHER): Payer: Medicare Other | Admitting: General Surgery

## 2011-09-04 ENCOUNTER — Encounter (INDEPENDENT_AMBULATORY_CARE_PROVIDER_SITE_OTHER): Payer: Self-pay

## 2011-09-04 ENCOUNTER — Encounter (INDEPENDENT_AMBULATORY_CARE_PROVIDER_SITE_OTHER): Payer: Self-pay | Admitting: General Surgery

## 2011-09-04 VITALS — BP 138/82 | HR 76 | Temp 98.6°F | Resp 14 | Ht 61.5 in | Wt 97.0 lb

## 2011-09-04 DIAGNOSIS — C493 Malignant neoplasm of connective and soft tissue of thorax: Secondary | ICD-10-CM

## 2011-09-04 NOTE — Progress Notes (Signed)
ERROR

## 2011-09-04 NOTE — Progress Notes (Signed)
Subjective:     Patient ID: Veronica Ellison, female   DOB: 03/14/1922, 78 y.o.   MRN: [id number]  HPI Patient underwent excision right paraspinous mass and biopsy of previous sarcoma resection site. Pathology revealed paraspinous mass to be consistent with sarcoma. Medial margin was positive. Other margins are negative. A biopsy from the old excision site scar was benign. She is doing well. She feels both sites have healed. She spoke with Dr. Dayton Scrape from radiation oncology. I have also spoken with him regarding the potential for local radiation treatment.  Review of Systems     Objective:   Physical Exam Paraspinous site is well healed. There is no signs of infection. There is no palpable nodularity remaining. The incision on her previous scar lateral chest is also nearly completely healed with no signs of infection.    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 The rest of her about considering radiation therapy. She is agreeable to this. I am very happy that this field can be treated. Dr. Dayton Scrape Know. We'll see her back in 2 months.

## 2011-09-10 ENCOUNTER — Ambulatory Visit
Admission: RE | Admit: 2011-09-10 | Discharge: 2011-09-10 | Disposition: A | Discharge status: Home / Self Care | Payer: Medicare Other | Source: Ambulatory Visit | Attending: Radiation Oncology | Admitting: Radiation Oncology

## 2011-09-10 DIAGNOSIS — C499 Malignant neoplasm of connective and soft tissue, unspecified: Secondary | ICD-10-CM

## 2011-09-10 NOTE — Progress Notes (Signed)
Simulation/treatment planning note:  After careful review of her previous radiation therapy fields, but photons electrons she was taken to the CT simulator and placed prone. Utilizing her set up photographs for her posterior right chest wall fields and also subtle skin changes and moles I feel comfortable the final border of her previous electron beam radiation therapy. Her right paraspinal was marked with a radiopaque wire. I then outlined her intended electron beam treatment field which was overlapping the previous electron beam field by less than 1 cm. She'll be taken to the [facility] for a formal outline and construction of a custom block to conform the field. A CT scan was performed today to mark the depth of her spinal cord. She will receive a every week dose of approximately 5600 cGy in 28 sessions, and I'll probably reduce her field after 4000 to 4600 cGy to minimize overlap with her previous electron beam field. Her treatment represents a special treatment procedure was overlapping fields from her previous radiation therapy recognized the increased risk for soft tissue/rib damage. This was explained to the patient and her daughter and consent was signed today. She'll be treated with 9 MEV electrons, and probably utilize 0.5 cm custom bolus to maximize the dose to the skin surface.

## 2011-09-17 ENCOUNTER — Ambulatory Visit: Payer: Medicare Other

## 2011-09-18 ENCOUNTER — Ambulatory Visit: Payer: Medicare Other

## 2011-09-19 ENCOUNTER — Ambulatory Visit
Admission: RE | Admit: 2011-09-19 | Discharge: 2011-09-19 | Disposition: A | Discharge status: Home / Self Care | Payer: Medicare Other | Source: Ambulatory Visit | Attending: Radiation Oncology | Admitting: Radiation Oncology

## 2011-09-19 ENCOUNTER — Ambulatory Visit: Payer: Medicare Other

## 2011-09-20 ENCOUNTER — Ambulatory Visit: Payer: Medicare Other

## 2011-09-23 ENCOUNTER — Encounter: Payer: Self-pay | Admitting: Radiation Oncology

## 2011-09-23 ENCOUNTER — Ambulatory Visit
Admission: RE | Admit: 2011-09-23 | Discharge: 2011-09-23 | Disposition: A | Discharge status: Home / Self Care | Payer: Medicare Other | Source: Ambulatory Visit | Attending: Radiation Oncology | Admitting: Radiation Oncology

## 2011-09-23 VITALS — BP 155/74 | HR 65 | Temp 97.6°F | Wt 100.9 lb

## 2011-09-23 DIAGNOSIS — C493 Malignant neoplasm of connective and soft tissue of thorax: Secondary | ICD-10-CM

## 2011-09-23 DIAGNOSIS — C499 Malignant neoplasm of connective and soft tissue, unspecified: Secondary | ICD-10-CM

## 2011-09-23 MED ORDER — RADIAPLEXRX EX GEL
Freq: Once | CUTANEOUS | Status: AC
  Administered 2011-09-23: 17:00:00 via TOPICAL

## 2011-09-23 NOTE — Addendum Note (Signed)
Encounter addended by: Delynn Flavin, RN on: 09/23/2011  7:01 PM<BR>     Documentation filed: Inpatient MAR

## 2011-09-23 NOTE — Addendum Note (Signed)
Encounter addended by: Delynn Flavin, RN on: 09/23/2011  6:34 PM<BR>     Documentation filed: Visit Diagnoses, Orders

## 2011-09-23 NOTE — Progress Notes (Signed)
Weekly Management Note:  Site:R parasternal back Current Dose:  200  cGy Projected Dose: 5600  cGy  Narrative: The patient is seen today for routine under treatment assessment. CBCT/MVCT images/port films were reviewed. The chart was reviewed.   No complaints today. Setup checked on the [facility]. She was given Radioplex gel to use when necessary.  Physical Examination:  Filed Vitals:   09/23/11 1559  BP: 155/74  Pulse: 65  Temp:   .  Weight: 100 lb 14.4 oz (45.768 kg). No skin changes.  Impression: Tolerating radiation therapy well.  Plan: Continue radiation therapy as planned.

## 2011-09-23 NOTE — Progress Notes (Signed)
1st treatment today.  Post sim education to include skin care and not using her OTC lotions in her tx field on her right back region.  Given radiaplex gel to use.  Reviewed check-in process and total number of treatments(28).  Requested she report any pain or problems during the treatment process.

## 2011-09-24 ENCOUNTER — Ambulatory Visit
Admission: RE | Admit: 2011-09-24 | Discharge: 2011-09-24 | Disposition: A | Discharge status: Home / Self Care | Payer: Medicare Other | Source: Ambulatory Visit | Attending: Radiation Oncology | Admitting: Radiation Oncology

## 2011-09-25 ENCOUNTER — Ambulatory Visit
Admission: RE | Admit: 2011-09-25 | Discharge: 2011-09-25 | Disposition: A | Discharge status: Home / Self Care | Payer: Medicare Other | Source: Ambulatory Visit | Attending: Radiation Oncology | Admitting: Radiation Oncology

## 2011-09-26 ENCOUNTER — Ambulatory Visit
Admission: RE | Admit: 2011-09-26 | Discharge: 2011-09-26 | Disposition: A | Discharge status: Home / Self Care | Payer: Medicare Other | Source: Ambulatory Visit | Attending: Radiation Oncology | Admitting: Radiation Oncology

## 2011-09-27 ENCOUNTER — Ambulatory Visit
Admission: RE | Admit: 2011-09-27 | Discharge: 2011-09-27 | Disposition: A | Discharge status: Home / Self Care | Payer: Medicare Other | Source: Ambulatory Visit | Attending: Radiation Oncology | Admitting: Radiation Oncology

## 2011-09-30 ENCOUNTER — Ambulatory Visit
Admission: RE | Admit: 2011-09-30 | Discharge: 2011-09-30 | Disposition: A | Discharge status: Home / Self Care | Payer: Medicare Other | Source: Ambulatory Visit | Attending: Radiation Oncology | Admitting: Radiation Oncology

## 2011-09-30 ENCOUNTER — Encounter: Payer: Self-pay | Admitting: Radiation Oncology

## 2011-09-30 VITALS — BP 159/75 | HR 59 | Wt 100.7 lb

## 2011-09-30 DIAGNOSIS — C50919 Malignant neoplasm of unspecified site of unspecified female breast: Secondary | ICD-10-CM

## 2011-09-30 DIAGNOSIS — C499 Malignant neoplasm of connective and soft tissue, unspecified: Secondary | ICD-10-CM

## 2011-09-30 DIAGNOSIS — C493 Malignant neoplasm of connective and soft tissue of thorax: Secondary | ICD-10-CM

## 2011-09-30 NOTE — Progress Notes (Addendum)
   Weekly Management Note: Right paraspinal chest wall Current Dose:  1200 cGy  Projected Dose: 5600 cGy   Narrative:  The patient presents for routine under treatment assessment.  CBCT/MVCT images/Port film x-rays were reviewed.  The chart was checked. She denies any pain related to her paraspinal region. Using radiaplex gel.  Physical Findings:  weight is 100 lb 11.2 oz (45.677 kg). Her blood pressure is 159/75 and her pulse is 59.  small patch of erythema over her back within the treatment fields.  Impression:  The patient is tolerating radiotherapy.  Plan:  Continue radiotherapy as planned.  ________________________________   Lonie Peak, M.D.

## 2011-09-30 NOTE — Progress Notes (Addendum)
Pt has chronic arthritic pain in back; takes Oxycodone 4 x/day, Tramadol prn breakthru pain. Applying Radiaplex to tx area TID, has some itching which is relieved w/Radiaplex.

## 2011-10-01 ENCOUNTER — Ambulatory Visit
Admission: RE | Admit: 2011-10-01 | Discharge: 2011-10-01 | Disposition: A | Discharge status: Home / Self Care | Payer: Medicare Other | Source: Ambulatory Visit | Attending: Radiation Oncology | Admitting: Radiation Oncology

## 2011-10-01 IMAGING — CR DG PELVIS 1-2V
1 series · 1 of 1 positions shown · non-contrast
Comparison: 08/21/2010.

CLINICAL DATA: Fall.  Left posterior hip pain.

PELVIS - 1-2 VIEW

[t pelvis a.p.]
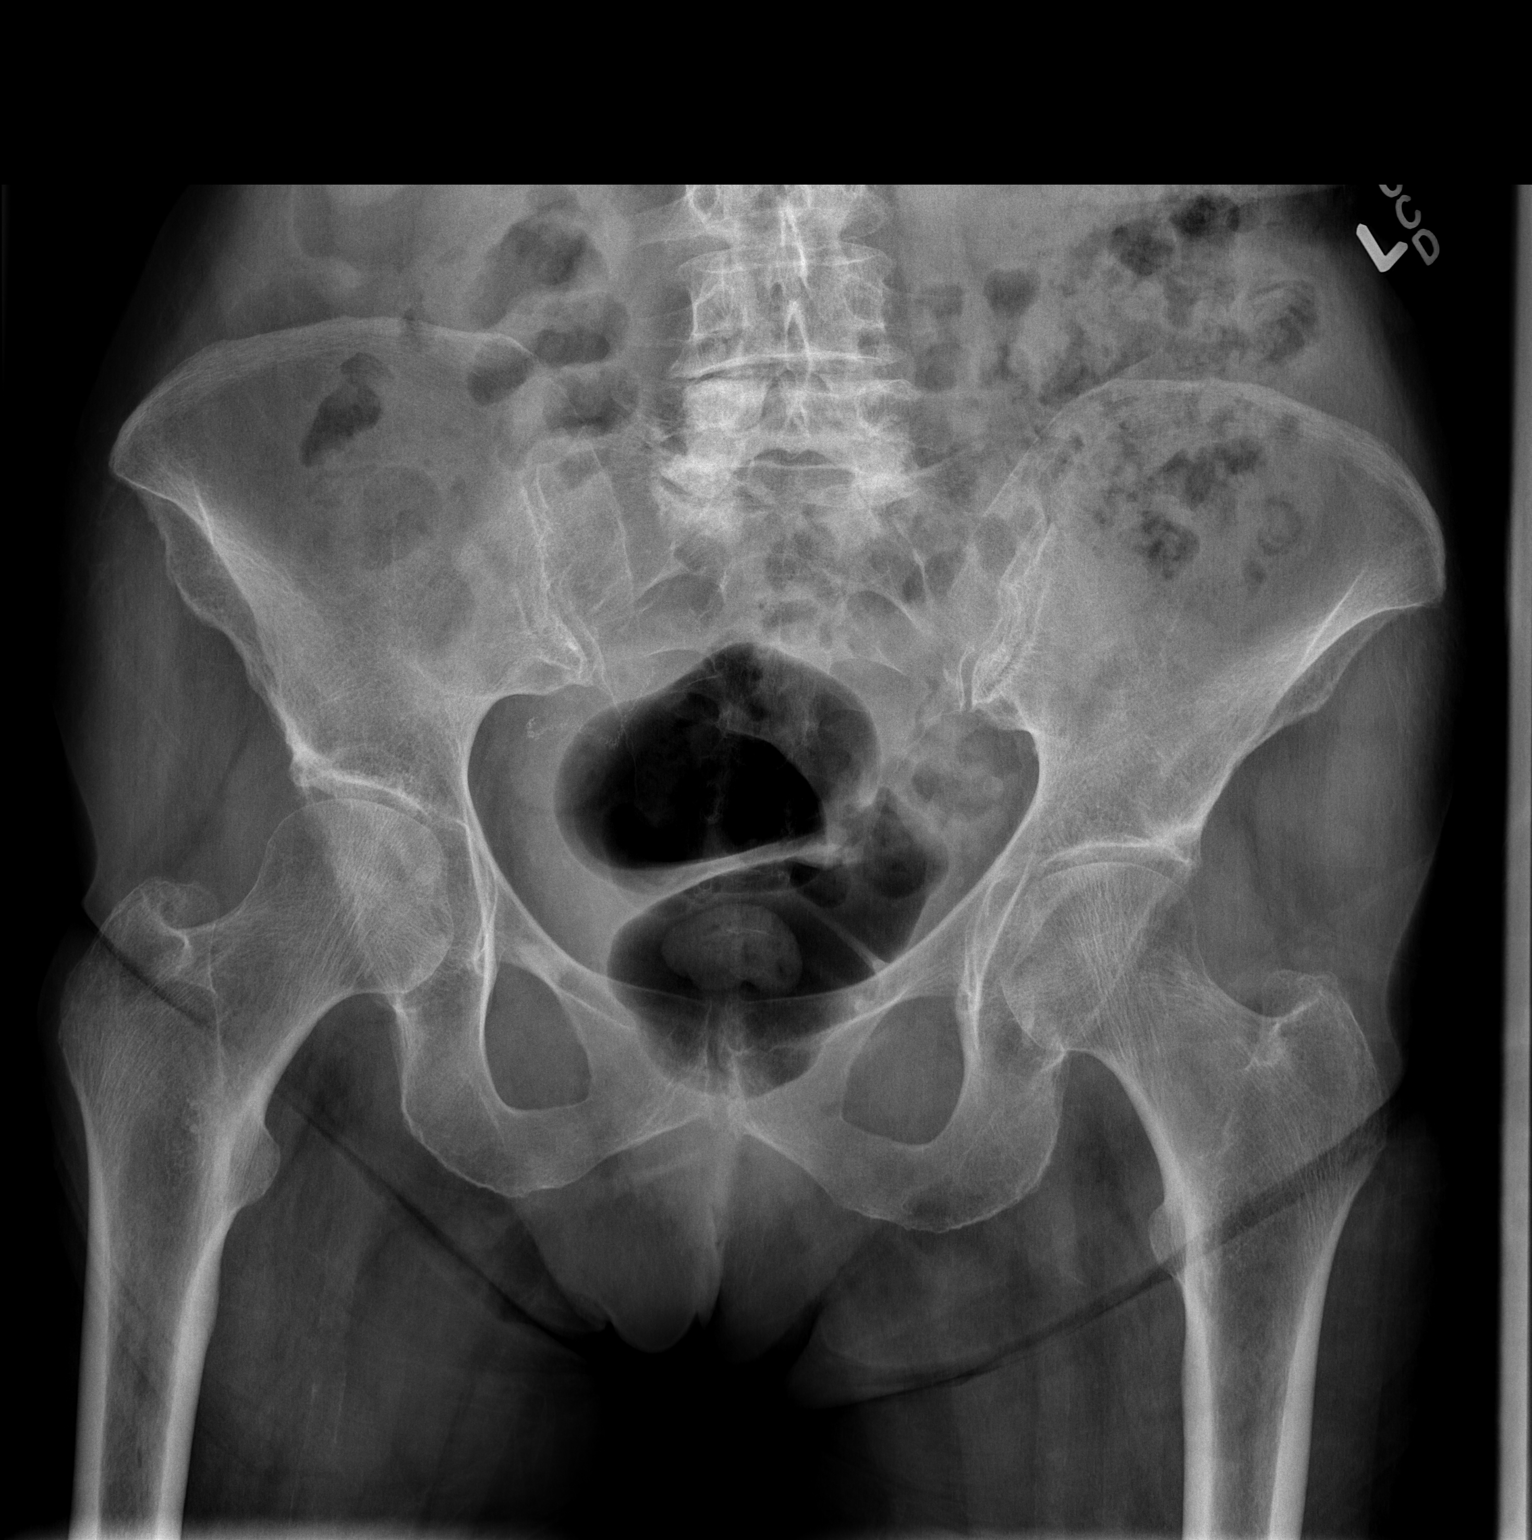

[1 of 1 positions shown; findings below may reference images not displayed]

FINDINGS: The obturator rings appear intact.  Pubic symphysis
degenerative disease.  Lumbar spondylosis previously characterized.
Cholecystectomy clips.  Bowel staples are present in the left
anatomic pelvis.  Gaseous distention of the rectosigmoid.  No
displaced fracture is identified.  Trabecular markings in the
femoral necks normal bilaterally.
IMPRESSION: No acute osseous abnormality.

## 2011-10-02 ENCOUNTER — Ambulatory Visit
Admission: RE | Admit: 2011-10-02 | Discharge: 2011-10-02 | Disposition: A | Discharge status: Home / Self Care | Payer: Medicare Other | Source: Ambulatory Visit | Attending: Radiation Oncology | Admitting: Radiation Oncology

## 2011-10-02 ENCOUNTER — Other Ambulatory Visit: Payer: Self-pay | Admitting: Oncology

## 2011-10-02 DIAGNOSIS — Z853 Personal history of malignant neoplasm of breast: Secondary | ICD-10-CM

## 2011-10-02 DIAGNOSIS — Z9012 Acquired absence of left breast and nipple: Secondary | ICD-10-CM

## 2011-10-03 ENCOUNTER — Ambulatory Visit
Admission: RE | Admit: 2011-10-03 | Discharge: 2011-10-03 | Disposition: A | Discharge status: Home / Self Care | Payer: Medicare Other | Source: Ambulatory Visit | Attending: Radiation Oncology | Admitting: Radiation Oncology

## 2011-10-04 ENCOUNTER — Ambulatory Visit: Admission: RE | Admit: 2011-10-04 | Payer: Medicare Other | Source: Ambulatory Visit

## 2011-10-04 IMAGING — XA IR KYPHOPLASTY LUMBAR INIT
1 series · 14 of 24 positions shown · non-contrast
Comparison: CT scan of the lumbosacral spine of 08/21/2010.

CLINICAL DATA: Severe low back pain secondary to compression
fracture at L2 suspected pathologic.

LUMBAR KYPHOPLASTY AT L2

[Series 300: ir kyphoplasty or sacroplasty · 14 of 25 slices shown]
[im 1/25]
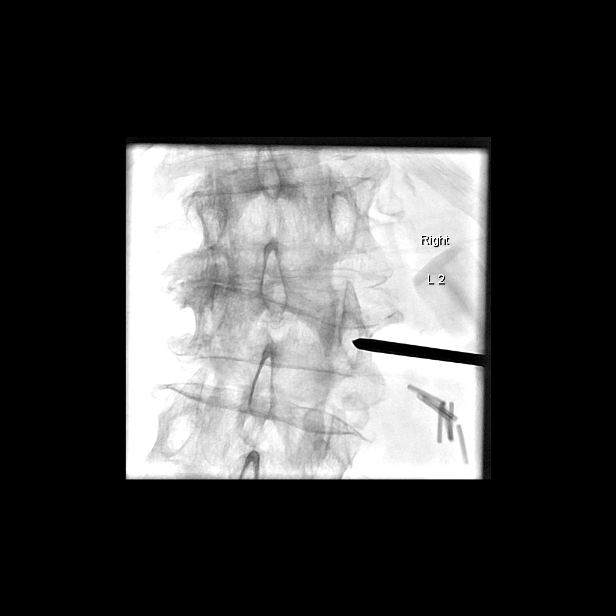
[im 3/25]
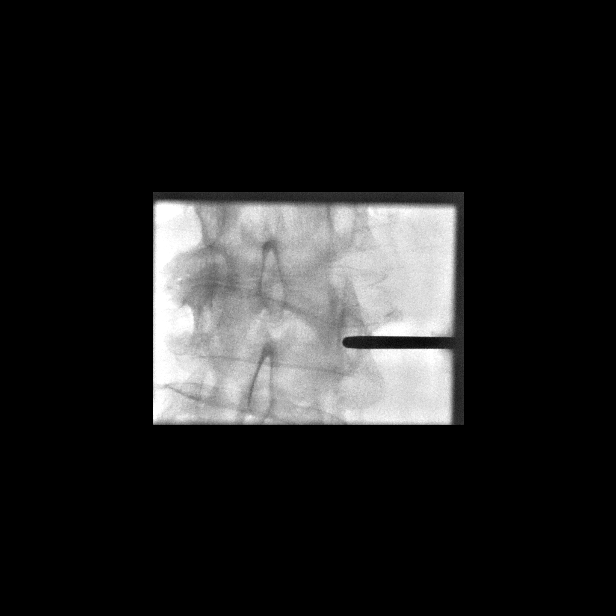
[im 5/25]
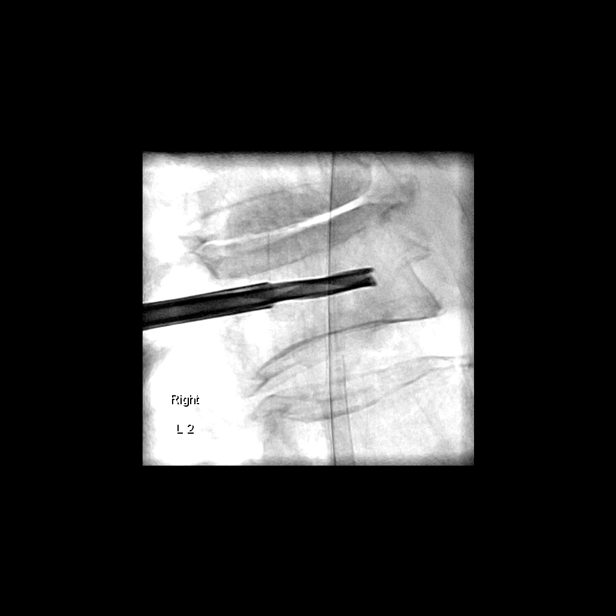
[im 7/25]
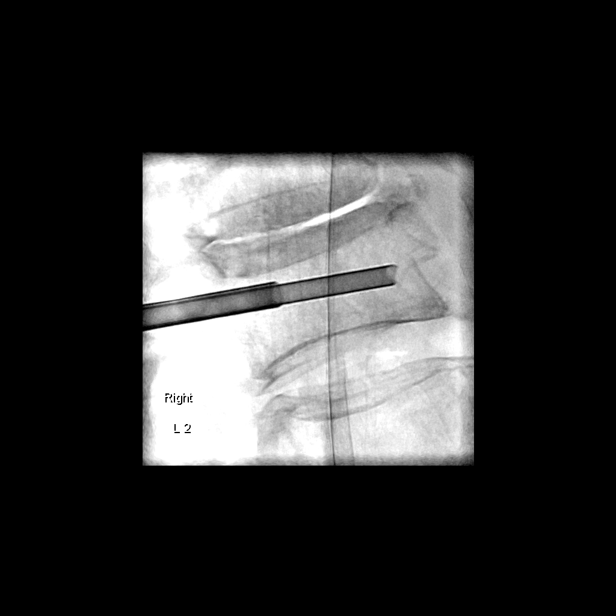
[im 8/25]
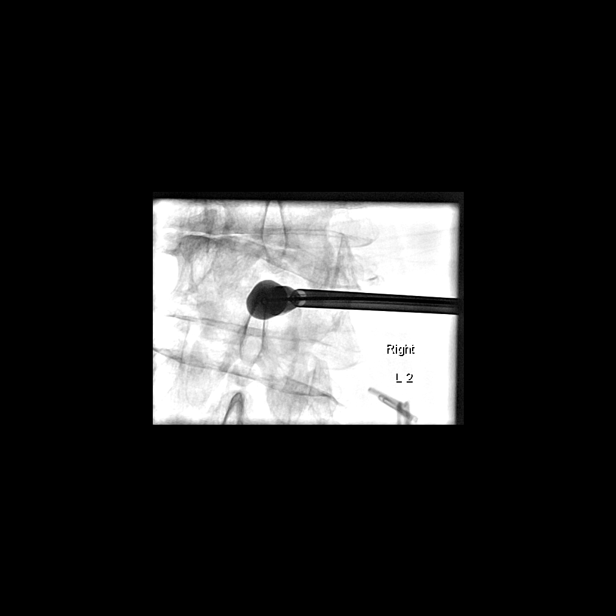
[im 10/25]
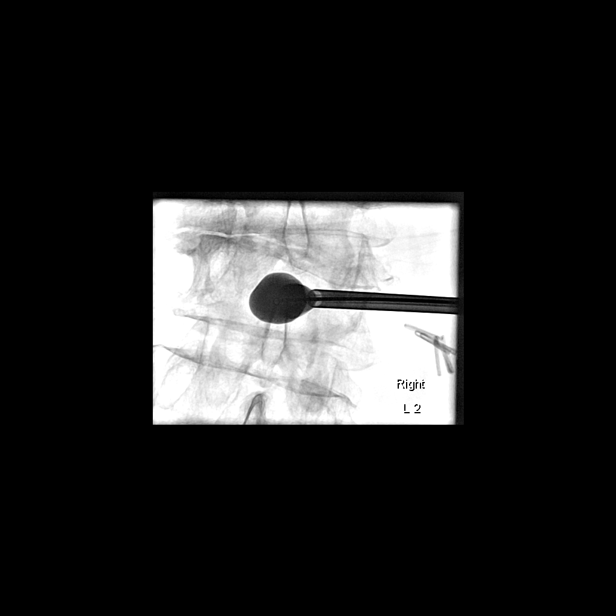
[im 12/25]
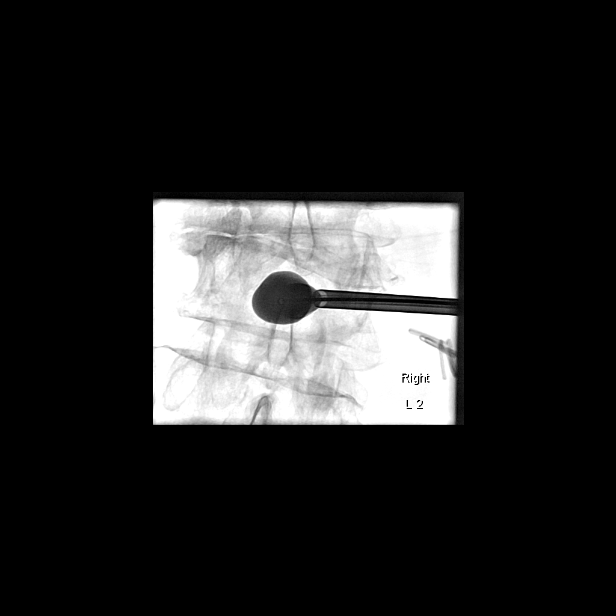
[im 13/25]
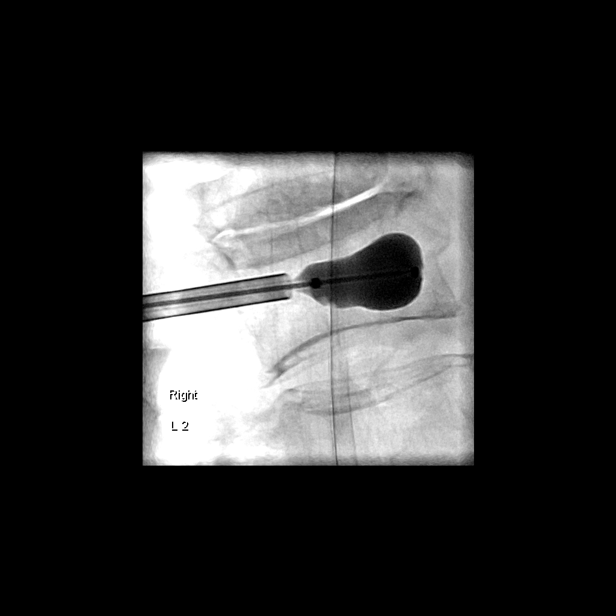
[im 15/25]
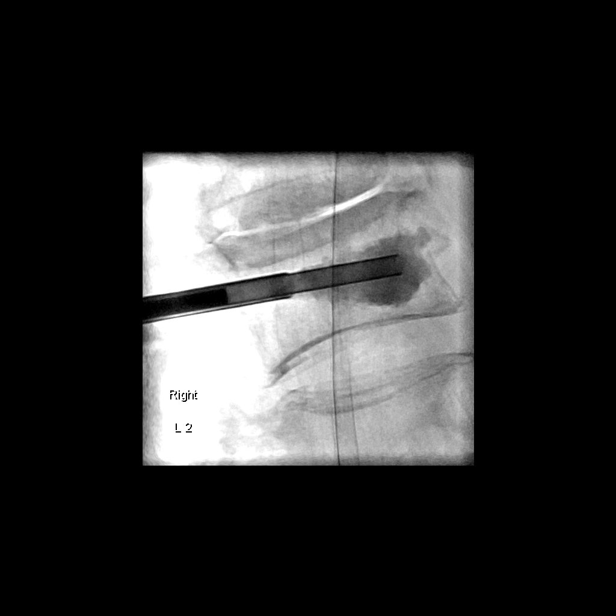
[im 17/25]
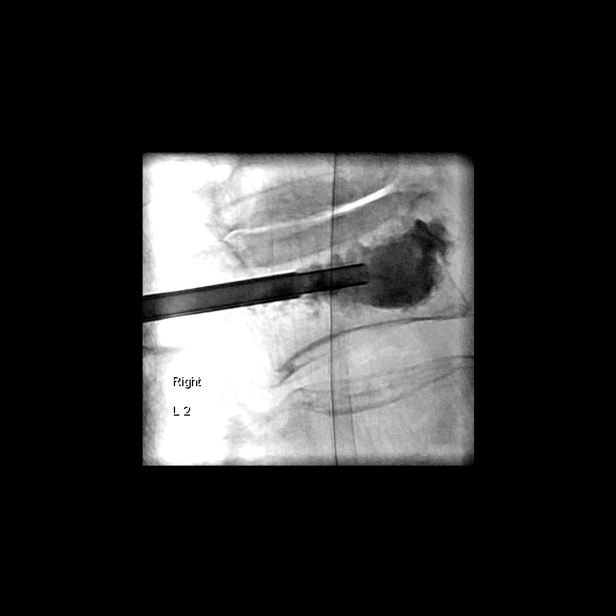
[im 19/25]
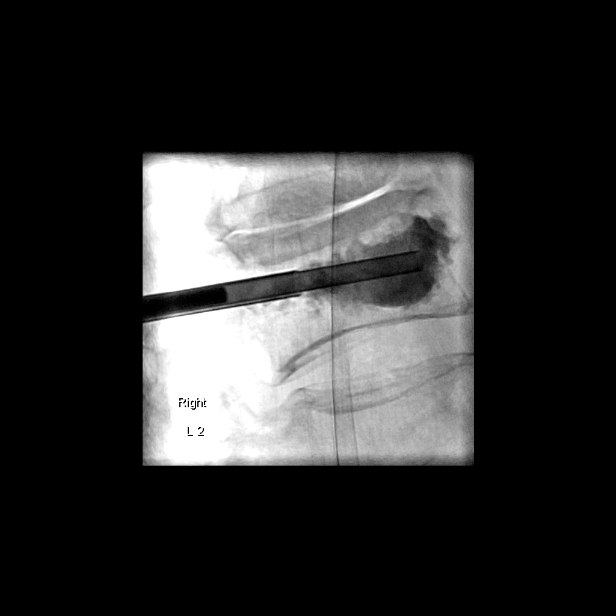
[im 20/25]
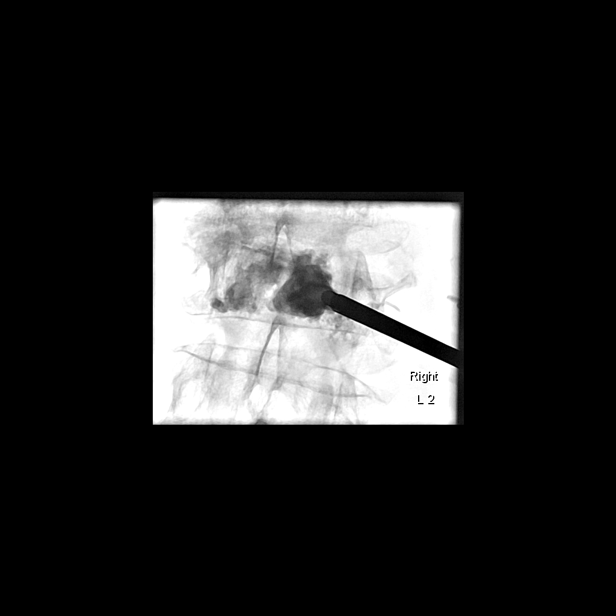
[im 22/25]
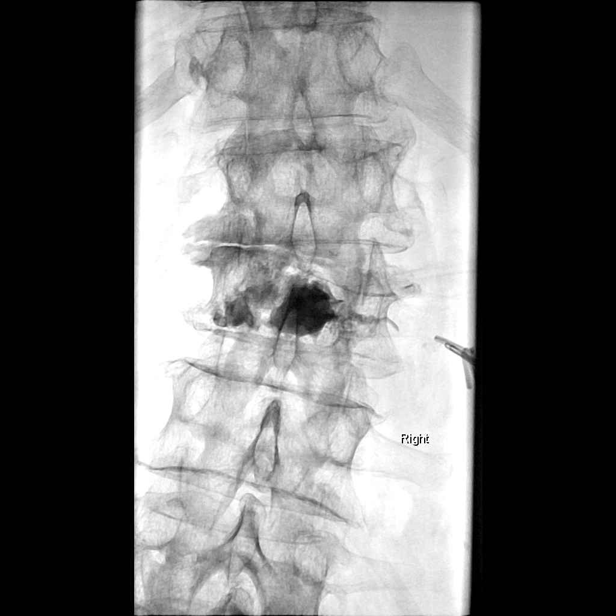
[im 25/25]
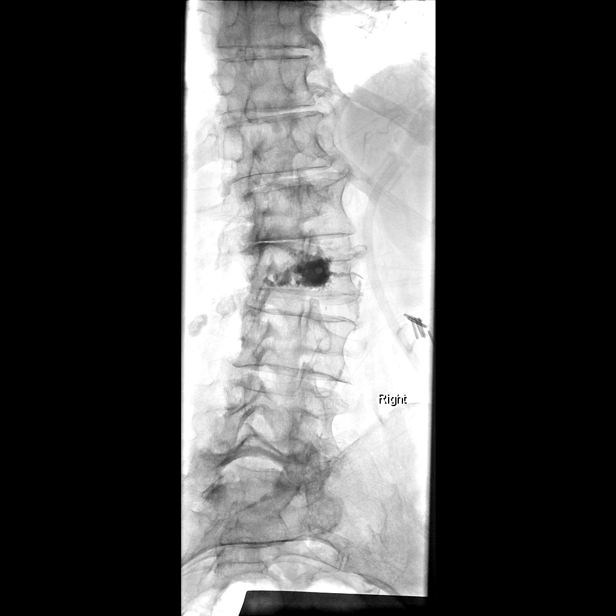

[14 of 24 positions shown; findings below may reference images not displayed]

Following a full explanation of the procedure along with the
potential associated complications, an informed witnessed consent
was obtained.

The patient was placed prone on the fluoroscopic table.  The skin
overlying the lumbar region was then prepped and draped in the
usual sterile fashion.

The right pedicle at L2 was then infiltrated with a 0.25%
bupivacaine, followed by the advancement of an 11-gauge Jamshidi
needle through the right pedicle into the posterior third at L2.

This was then exchanged out for a Kyphon advanced osteo introducer
system comprised of a working cannula and a Kyphon osteo drill over
a Kyphon osteo bone pin. This combination was advanced until the
tip of the Kyphon osteo drill was in the posterior third at L2.  At
this time the bone pin was removed.

In a medial trajectory the combination was advanced until the tip
of the working cannula was inside the posterior third at L2.  At
this time the Kyphon osteo drill was removed and a core sample sent
for pathologic analysis.

Through the working cannula, a Kyphon bone biopsy device was
advanced to within 5 mm of the anterior aspect of L2.  A core
sample from this was also seen for pathologic analysis.

Through the working cannula, a Kyphon inflatable bone tamp 20 x 3
was advanced and positioned such that the distal marker was 5 mm
from the anterior aspect of L2.  This was then expanded with
contrast using the Kyphon inflation syringe device via microtubing.

The inflation was continued until there was apposition with the
superior endplate

At this time methylmethacrylate mixture was reconstituted with
Tobramycin in the Kyphon bone mixing device system.

This was then loaded onto the Kyphon bone fillers.

The balloon was deflated and removed followed by the instillation
of 3 bone filler equivalents of methylmethacrylate mixture with
excellent filling in the AP and lateral projections.

There was no extravasation noted into disc spaces or posteriorly
into the spinal canal.  The working cannula and the bone filler
were removed.  Hemostasis was achieved at the skin entry site.

The patient tolerated the procedure well.  There were no acute
complications.

Medications utilized:  Versed 2 mg IV.  Fentanyl 50 mcg IV.

 IMPRESSION
1.  Status post fluoroscopic-guided needle placement for deep core
bone biopsy at L2.
2.  Status post vertebral body augmentation for painful
osteoporotic/pathologic compression fracture at L2 using the
balloon kyphoplasty technique.

## 2011-10-07 ENCOUNTER — Encounter: Payer: Self-pay | Admitting: Radiation Oncology

## 2011-10-07 ENCOUNTER — Ambulatory Visit
Admission: RE | Admit: 2011-10-07 | Discharge: 2011-10-07 | Disposition: A | Discharge status: Home / Self Care | Payer: Medicare Other | Source: Ambulatory Visit | Attending: Radiation Oncology | Admitting: Radiation Oncology

## 2011-10-07 VITALS — BP 150/72 | HR 67 | Temp 97.2°F | Resp 20 | Wt 99.3 lb

## 2011-10-07 DIAGNOSIS — C499 Malignant neoplasm of connective and soft tissue, unspecified: Secondary | ICD-10-CM

## 2011-10-07 NOTE — Progress Notes (Signed)
Pt c/o fatigue, some loss of appetite, primarily related to her inability to taste most foods. Applying Radiaplex to tx area.

## 2011-10-07 NOTE — Progress Notes (Signed)
Weekly Management Note:  Site:R paraspinal chestwall Current Dose:  2000  cGy Projected Dose: 5600  cGy  Narrative: The patient is seen today for routine under treatment assessment. CBCT/MVCT images/port films were reviewed. The chart was reviewed.   Her appetite is somewhat diminished. She is using Radioplex gel when necessary.  Physical Examination:  Filed Vitals:   10/07/11 1514  BP: 150/72  Pulse: 67  Temp: 97.2 F (36.2 C)  Resp: 20  .  Weight: 99 lb 4.8 oz (45.042 kg). There is slight erythema within the electron beam field with a "plaque-like" texture to her the skin centrally.  Impression: Tolerating radiation therapy well.  Plan: Continue radiation therapy as planned.

## 2011-10-08 ENCOUNTER — Ambulatory Visit
Admission: RE | Admit: 2011-10-08 | Discharge: 2011-10-08 | Disposition: A | Discharge status: Home / Self Care | Payer: Medicare Other | Source: Ambulatory Visit | Attending: Radiation Oncology | Admitting: Radiation Oncology

## 2011-10-09 ENCOUNTER — Ambulatory Visit
Admission: RE | Admit: 2011-10-09 | Discharge: 2011-10-09 | Disposition: A | Discharge status: Home / Self Care | Payer: Medicare Other | Source: Ambulatory Visit | Attending: Radiation Oncology | Admitting: Radiation Oncology

## 2011-10-11 ENCOUNTER — Ambulatory Visit
Admission: RE | Admit: 2011-10-11 | Discharge: 2011-10-11 | Disposition: A | Discharge status: Home / Self Care | Payer: Medicare Other | Source: Ambulatory Visit | Attending: Radiation Oncology | Admitting: Radiation Oncology

## 2011-10-14 ENCOUNTER — Encounter: Payer: Self-pay | Admitting: Radiation Oncology

## 2011-10-14 ENCOUNTER — Ambulatory Visit
Admission: RE | Admit: 2011-10-14 | Discharge: 2011-10-14 | Disposition: A | Discharge status: Home / Self Care | Payer: Medicare Other | Source: Ambulatory Visit | Attending: Radiation Oncology | Admitting: Radiation Oncology

## 2011-10-14 VITALS — BP 177/81 | HR 63 | Resp 18 | Wt 101.5 lb

## 2011-10-14 DIAGNOSIS — C499 Malignant neoplasm of connective and soft tissue, unspecified: Secondary | ICD-10-CM

## 2011-10-14 NOTE — Progress Notes (Signed)
Patient presents to the clinic today accompanied by her daughter for an under treat visit with Dr. Dayton Scrape. Patient is alert and oriented to person, place, and time. No distress noted. Slow steady gait noted with assistance of rolling walker. Pleasant affect noted. Patient denies pain at this time. Patient denies cough and sore throat. Patient denies cough. Patient reports hyperpigmented dry itchy skin on her right upper back. Patient reports using Radiaplex daily as directed. Reported all findings to Dr. Dayton Scrape.

## 2011-10-14 NOTE — Progress Notes (Signed)
Weekly Management Note:  Site:R parasternal chestwall Current Dose:  2800  cGy Projected Dose: 5600  cGy  Narrative: The patient is seen today for routine under treatment assessment. CBCT/MVCT images/port films were reviewed. The chart was reviewed.   She is without complaints today. She uses Radioplex gel.  Physical Examination:  Filed Vitals:   10/14/11 1552  BP: 177/81  Pulse: 63  Resp: 18  .  Weight: 101 lb 8 oz (46.04 kg). There is erythema the skin within her treatment field. There is more intense erythema centrally centered along her scar. There is no obvious desquamation.  Impression: Tolerating radiation therapy well. We will move ahead with electron beam simulation for her reduced field tomorrow.  Plan: Continue radiation therapy as planned.

## 2011-10-15 ENCOUNTER — Ambulatory Visit
Admission: RE | Admit: 2011-10-15 | Discharge: 2011-10-15 | Disposition: A | Discharge status: Home / Self Care | Payer: Medicare Other | Source: Ambulatory Visit | Attending: Radiation Oncology | Admitting: Radiation Oncology

## 2011-10-15 DIAGNOSIS — C499 Malignant neoplasm of connective and soft tissue, unspecified: Secondary | ICD-10-CM

## 2011-10-15 NOTE — Progress Notes (Signed)
Electron beam simulation note: The patient was set up for her reduced electron beam field to avoid overlap with her previous radiation therapy field. One new custom block is constructed to conform the field. I am prescribing a further 1200 cGy in 6 sessions utilizing 9 MEV electrons.

## 2011-10-16 ENCOUNTER — Ambulatory Visit
Admission: RE | Admit: 2011-10-16 | Discharge: 2011-10-16 | Disposition: A | Discharge status: Home / Self Care | Payer: Medicare Other | Source: Ambulatory Visit | Attending: Radiation Oncology | Admitting: Radiation Oncology

## 2011-10-17 ENCOUNTER — Ambulatory Visit
Admission: RE | Admit: 2011-10-17 | Discharge: 2011-10-17 | Disposition: A | Discharge status: Home / Self Care | Payer: Medicare Other | Source: Ambulatory Visit | Attending: Radiation Oncology | Admitting: Radiation Oncology

## 2011-10-18 ENCOUNTER — Ambulatory Visit
Admission: RE | Admit: 2011-10-18 | Discharge: 2011-10-18 | Disposition: A | Discharge status: Home / Self Care | Payer: Medicare Other | Source: Ambulatory Visit | Attending: Radiation Oncology | Admitting: Radiation Oncology

## 2011-10-21 ENCOUNTER — Ambulatory Visit
Admission: RE | Admit: 2011-10-21 | Discharge: 2011-10-21 | Disposition: A | Discharge status: Home / Self Care | Payer: Medicare Other | Source: Ambulatory Visit | Attending: Radiation Oncology | Admitting: Radiation Oncology

## 2011-10-21 ENCOUNTER — Encounter: Payer: Self-pay | Admitting: Radiation Oncology

## 2011-10-21 VITALS — BP 178/82 | HR 60 | Temp 97.2°F | Resp 20 | Wt 101.6 lb

## 2011-10-21 DIAGNOSIS — C50919 Malignant neoplasm of unspecified site of unspecified female breast: Secondary | ICD-10-CM

## 2011-10-21 DIAGNOSIS — C493 Malignant neoplasm of connective and soft tissue of thorax: Secondary | ICD-10-CM

## 2011-10-21 DIAGNOSIS — C499 Malignant neoplasm of connective and soft tissue, unspecified: Secondary | ICD-10-CM

## 2011-10-21 NOTE — Progress Notes (Signed)
   Weekly Management: right paraspinal chest wall Current Dose:   38Gy  Projected Dose:  56Gy   Narrative:  The patient presents for routine under treatment assessment.  CBCT/MVCT images/Port film x-rays were reviewed.  The chart was checked.doing well. Has a little bit of itching over her back. Taking tramadol and oxycodone for pain  Physical Findings:  weight is 101 lb 9.6 oz (46.085 kg). Her oral temperature is 97.2 F (36.2 C). Her blood pressure is 178/82 and her pulse is 60. Her respiration is 20. Erythema over the mid back scar.  Impression:  The patient is tolerating radiotherapy.  Plan:  Continue radiotherapy as planned. continue radiaplex.  ________________________________   Lonie Peak, M.D.

## 2011-10-21 NOTE — Progress Notes (Signed)
Pt has chronic back pain, takes pain meds regularly. She has loss of appetite but eats meals regularly. Pt denies fatigue. Applying Radiaplex to mid back where she has small area hyperpigmentation, dry desquamation.

## 2011-10-22 ENCOUNTER — Ambulatory Visit
Admission: RE | Admit: 2011-10-22 | Discharge: 2011-10-22 | Disposition: A | Discharge status: Home / Self Care | Payer: Medicare Other | Source: Ambulatory Visit | Attending: Radiation Oncology | Admitting: Radiation Oncology

## 2011-10-23 ENCOUNTER — Ambulatory Visit
Admission: RE | Admit: 2011-10-23 | Discharge: 2011-10-23 | Disposition: A | Discharge status: Home / Self Care | Payer: Medicare Other | Source: Ambulatory Visit | Attending: Radiation Oncology | Admitting: Radiation Oncology

## 2011-10-24 ENCOUNTER — Ambulatory Visit
Admission: RE | Admit: 2011-10-24 | Discharge: 2011-10-24 | Disposition: A | Discharge status: Home / Self Care | Payer: Medicare Other | Source: Ambulatory Visit | Attending: Radiation Oncology | Admitting: Radiation Oncology

## 2011-10-25 ENCOUNTER — Ambulatory Visit
Admission: RE | Admit: 2011-10-25 | Discharge: 2011-10-25 | Disposition: A | Discharge status: Home / Self Care | Payer: Medicare Other | Source: Ambulatory Visit | Attending: Family Medicine | Admitting: Family Medicine

## 2011-10-25 ENCOUNTER — Other Ambulatory Visit: Payer: Self-pay | Admitting: Family Medicine

## 2011-10-25 ENCOUNTER — Ambulatory Visit: Payer: Medicare Other

## 2011-10-25 DIAGNOSIS — W19XXXA Unspecified fall, initial encounter: Secondary | ICD-10-CM

## 2011-10-28 ENCOUNTER — Ambulatory Visit
Admission: RE | Admit: 2011-10-28 | Discharge: 2011-10-28 | Disposition: A | Discharge status: Home / Self Care | Payer: Medicare Other | Source: Ambulatory Visit | Attending: Radiation Oncology | Admitting: Radiation Oncology

## 2011-10-28 ENCOUNTER — Encounter: Payer: Self-pay | Admitting: Radiation Oncology

## 2011-10-28 VITALS — BP 170/85 | HR 115 | Temp 97.6°F | Resp 18 | Wt 97.4 lb

## 2011-10-28 DIAGNOSIS — C499 Malignant neoplasm of connective and soft tissue, unspecified: Secondary | ICD-10-CM

## 2011-10-28 NOTE — Progress Notes (Signed)
Patient presents to the clinic today accompanied by her daughter for an under treat visit with Dr. Dayton Scrape. Patient is alert and oriented to person, place, and time. No distress noted. Slow steady gait noted with assistance of rolling walker. Pleasant affect noted. Patient reports low back and left left pain constant 3 on a scale of 0-10 despite taking percocet and tramadol. Patient reports nausea and decreased appetite despite taking zofran. Steady increase in blood pressure noted. Patient reports she was seen just last week by her pcp without complications. Encouraged patient to document blood pressure on a daily basis. Patient verbalized understanding. Patient denies constipation. Patient reports taking Colace daily. Patient reports increased anxiety related to weighing option about future medical care and selling her home. 4.3 lb weight loss noted. Orthostatic obtained and documented. Hyperpigmentation and dry desquamation with itching noted to mid back. Patient reports using Radiaplex as directed. Reported all findings to Dr. Dayton Scrape.

## 2011-10-28 NOTE — Progress Notes (Signed)
Weekly Management Note:  Site:R posterior chestwall Current Dose:  4600  cGy Projected Dose: 5600  cGy  Narrative: The patient is seen today for routine under treatment assessment. CBCT/MVCT images/port films were reviewed. The chart was reviewed.   No new complaints today. She is bothered a little more by nausea and she's had this intermittently for the past 3 years. She takes Zofran when necessary. She also has retro-orbital headaches which are unchanged. No change in her vision. She is somewhat anxious about selling her home and moving into a retirement facility. She uses Radioplex gel in addition to hydrocortisone.   Physical Examination:  Filed Vitals:   10/28/11 1605  BP: 170/85  Pulse: 115  Temp:   Resp: 18  .  Weight: 97 lb 6.4 oz (44.18 kg). On inspection of the right paraspinous region there is moderate erythema with slight crusting and dry desquamation. No moist desquamation.  Impression: Tolerating radiation therapy well.  Plan: Continue radiation therapy as planned. She'll finish in one week.

## 2011-10-29 ENCOUNTER — Ambulatory Visit
Admission: RE | Admit: 2011-10-29 | Discharge: 2011-10-29 | Disposition: A | Discharge status: Home / Self Care | Payer: Medicare Other | Source: Ambulatory Visit | Attending: Oncology | Admitting: Oncology

## 2011-10-29 ENCOUNTER — Ambulatory Visit
Admission: RE | Admit: 2011-10-29 | Discharge: 2011-10-29 | Disposition: A | Discharge status: Home / Self Care | Payer: Medicare Other | Source: Ambulatory Visit | Attending: Radiation Oncology | Admitting: Radiation Oncology

## 2011-10-29 DIAGNOSIS — Z853 Personal history of malignant neoplasm of breast: Secondary | ICD-10-CM

## 2011-10-29 DIAGNOSIS — Z9012 Acquired absence of left breast and nipple: Secondary | ICD-10-CM

## 2011-10-30 ENCOUNTER — Ambulatory Visit (INDEPENDENT_AMBULATORY_CARE_PROVIDER_SITE_OTHER): Payer: Medicare Other | Admitting: General Surgery

## 2011-10-30 ENCOUNTER — Ambulatory Visit
Admission: RE | Admit: 2011-10-30 | Discharge: 2011-10-30 | Disposition: A | Discharge status: Home / Self Care | Payer: Medicare Other | Source: Ambulatory Visit | Attending: Radiation Oncology | Admitting: Radiation Oncology

## 2011-10-30 ENCOUNTER — Encounter (INDEPENDENT_AMBULATORY_CARE_PROVIDER_SITE_OTHER): Payer: Self-pay | Admitting: General Surgery

## 2011-10-30 VITALS — BP 138/70 | HR 72 | Temp 98.0°F | Resp 16 | Ht 61.5 in | Wt 100.4 lb

## 2011-10-30 DIAGNOSIS — C493 Malignant neoplasm of connective and soft tissue of thorax: Secondary | ICD-10-CM

## 2011-10-30 NOTE — Progress Notes (Signed)
Subjective:     Patient ID: Veronica Ellison, female   DOB: 06/02/1921, 78 y.o.   MRN: [id number]  HPI Patient is undergoing radiation treatments and tolerating them well with only some mild skin discomfort. She got some thickening of her skin along her spine but no discrete nodules. She is feeling nausea frequently and Dr. Dayton Scrape has increased her Zofran to scheduled.  Review of Systems     Objective:   Physical Exam  Eyes: No scleral icterus.  Neck: Normal range of motion.  Cardiovascular: Normal rate and normal heart sounds.   Pulmonary/Chest: Effort normal. No respiratory distress. She has no wheezes. She has no rales.       Right paraspinous area has superficial erythema and peelingin region of radiation treatment. No palpable nodularity. No nodules felt on either side of the spine. No new nodularity along the scar tissueof the previous excision sites around laterally and anteriorly right chest wall. wounds healed  Abdominal: Soft. There is no tenderness.       Assessment:     Tolerating radiation treatments well. No evidence of recurrence on my exam    Plan:     Return in 2 months for followup examination

## 2011-10-31 ENCOUNTER — Ambulatory Visit
Admission: RE | Admit: 2011-10-31 | Discharge: 2011-10-31 | Disposition: A | Discharge status: Home / Self Care | Payer: Medicare Other | Source: Ambulatory Visit | Attending: Radiation Oncology | Admitting: Radiation Oncology

## 2011-11-01 ENCOUNTER — Ambulatory Visit: Payer: Medicare Other

## 2011-11-01 ENCOUNTER — Ambulatory Visit
Admission: RE | Admit: 2011-11-01 | Discharge: 2011-11-01 | Disposition: A | Discharge status: Home / Self Care | Payer: Medicare Other | Source: Ambulatory Visit | Attending: Radiation Oncology | Admitting: Radiation Oncology

## 2011-11-04 ENCOUNTER — Ambulatory Visit
Admission: RE | Admit: 2011-11-04 | Discharge: 2011-11-04 | Disposition: A | Discharge status: Home / Self Care | Payer: Medicare Other | Source: Ambulatory Visit | Attending: Radiation Oncology | Admitting: Radiation Oncology

## 2011-11-04 ENCOUNTER — Encounter: Payer: Self-pay | Admitting: Radiation Oncology

## 2011-11-04 VITALS — Wt 102.0 lb

## 2011-11-04 DIAGNOSIS — C493 Malignant neoplasm of connective and soft tissue of thorax: Secondary | ICD-10-CM

## 2011-11-04 DIAGNOSIS — C499 Malignant neoplasm of connective and soft tissue, unspecified: Secondary | ICD-10-CM

## 2011-11-04 NOTE — Progress Notes (Signed)
   Weekly Management Note Completed Radiotherapy. Total Dose:  56Gy   Narrative:  The patient presents for routine under treatment assessment on last day of radiotherapy.  CBCT/MVCT images/Port film x-rays were reviewed.  The chart was checked. She is doing relatively well. She continues to take tramadol and oxycodone for pain. She reports that she has a history of degenerative disc disease which may be contributing to her pain. Her skin irritation is alleviated by radiaplex. She continues to have some nausea and this seems to be somewhat related to when she takes her pain medications. She does take her pain medications with food and Zofran as needed.  Physical Findings:  weight is 102 lb (46.267 kg).  weight is stable. shows erythema and dry desquamation over her back in the area of surgery. Sitting comfortably in a chair in no acute distress.  Impression:  The patient has tolerated radiotherapy.  Plan:  Routine follow-up in one month. ________________________________   Lonie Peak, M.D.

## 2011-11-04 NOTE — Progress Notes (Signed)
Pt will complete tx today. She has chronic back pain. Applying Radiaplex to mid back for hyperpigmentation, dry desquamation. Loss of appetite continues as well as fatigue. Pt states she "cut back on Zofran due to constipation and less nausea recently. Gave pt FU card.

## 2011-11-07 ENCOUNTER — Encounter: Payer: Self-pay | Admitting: Radiation Oncology

## 2011-11-07 NOTE — Progress Notes (Signed)
Chart note: A special port plan was requested during her electron beam simulation on 09/10/2011.

## 2011-11-07 NOTE — Progress Notes (Signed)
[facility] Radiation Oncology End of Treatment Note  Name:Veronica Ellison  Date: 11/07/2011 LKG:[id number] DOB:12-20-1921   Status:outpatient    CC: [name], MD  Dr. Violeta Gelinas  REFERRING PHYSICIAN: Dr. Violeta Gelinas    DIAGNOSIS: Recurrent spindle cell carcinoma, right chest wall   INDICATION FOR TREATMENT: Curative   TREATMENT DATES: 09/23/2011 through 11/04/2011                          SITE/DOSE:    Right paraspinal posterior chest wall 5600 cGy 28 sessions                        BEAMS/ENERGY:   9 MEV electrons with 1.0 cm custom bolus applied to the skin to maximize the dose to the skin surface. She underwent a field reduction after 4400 cGy in 20 sessions to move off her previous field of radiation.               NARRATIVE:   She tolerated treatment beautifully although she developed dry desquamation the skin within her treatment field by completion of therapy.   She used Radioplex gel and also hydrocortisone cream along her skin for pruritus.                      PLAN: Routine followup in one month. Patient instructed to call if questions or worsening complaints in interim.

## 2011-11-29 ENCOUNTER — Ambulatory Visit (HOSPITAL_BASED_OUTPATIENT_CLINIC_OR_DEPARTMENT_OTHER): Payer: Medicare Other | Admitting: Oncology

## 2011-11-29 ENCOUNTER — Telehealth: Payer: Self-pay | Admitting: Oncology

## 2011-11-29 ENCOUNTER — Encounter: Payer: Self-pay | Admitting: Oncology

## 2011-11-29 ENCOUNTER — Other Ambulatory Visit (HOSPITAL_BASED_OUTPATIENT_CLINIC_OR_DEPARTMENT_OTHER): Payer: Medicare Other

## 2011-11-29 VITALS — BP 141/71 | HR 74 | Temp 97.1°F | Resp 18 | Ht 61.5 in | Wt 101.1 lb

## 2011-11-29 DIAGNOSIS — C493 Malignant neoplasm of connective and soft tissue of thorax: Secondary | ICD-10-CM

## 2011-11-29 LAB — COMPREHENSIVE METABOLIC PANEL
ALT: 13 U/L (ref 0–35)
AST: 18 U/L (ref 0–37)
Albumin: 3.8 g/dL (ref 3.5–5.2)
Calcium: 9.3 mg/dL (ref 8.4–10.5)
Chloride: 104 mEq/L (ref 96–112)
Creatinine, Ser: 0.75 mg/dL (ref 0.50–1.10)
Potassium: 5 mEq/L (ref 3.5–5.3)
Sodium: 141 mEq/L (ref 135–145)
Total Protein: 6.3 g/dL (ref 6.0–8.3)

## 2011-11-29 LAB — CBC WITH DIFFERENTIAL/PLATELET
BASO%: 0.3 % (ref 0.0–2.0)
EOS%: 2.3 % (ref 0.0–7.0)
MCH: 32 pg (ref 25.1–34.0)
MCHC: 32.2 g/dL (ref 31.5–36.0)
MONO#: 0.4 10*3/uL (ref 0.1–0.9)
RBC: 3.81 10*6/uL (ref 3.70–5.45)
RDW: 14 % (ref 11.2–14.5)
WBC: 5.6 10*3/uL (ref 3.9–10.3)
lymph#: 1 10*3/uL (ref 0.9–3.3)

## 2011-11-29 NOTE — Progress Notes (Signed)
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 Pathologic stage pT2 cN0 M0. She had another recurrence outside of the previous field of radiation; and had resection on February 16, 2010 with same pathology but positive margin. She is also s/p adjuvant XRT for each time she had resection. She was started on 06/26/10 with adjuvant chemotherapy with continuous infusion of Adriamycin/Cytoxan over 7 days q4 wks. There was plan for at least 5 cycles. However, she developed recurrent mucositis, worsening performance status, and unrelated problem of severe back pain from degenerative joint disease. Therefore, adjuvant chemo was discontinued after two cycles. She had been on watchful observation until she developed local recurrent disease in August 2012. She underwent repeat resection on December 27, 2010. She was deemed not be be candidate for repeat radiation.   CURRENT THERAPY: watchful observation.   INTERVAL HISTORY: Veronica Ellison 78 y.o. female returns for regular follow up.  Since her last visit with [place], she had a resection of a progressing paraspinal mass in the upper lumbar area.  She had radiation to this area in July. She tolerated the radiation well; still has some itching and mild redness to site of radiation. She has her ongoing back pain and is taking Oxycodone and Ultram per PCP. She has no weakness in the legs or bowll/bladder incontinence.  She has mild fatigue; however, she is still independent of activities of daily living.   Patient denies headache, visual changes, confusion, drenching night sweats, palpable lymph node swelling, mucositis, odynophagia, dysphagia, nausea  vomiting, jaundice, chest pain, palpitation, shortness of breath, dyspnea on exertion, productive cough, gum bleeding, epistaxis, hematemesis, hemoptysis, abdominal pain, abdominal swelling, early satiety, melena, hematochezia, hematuria, skin rash, spontaneous bleeding, joint swelling, joint pain, heat or cold intolerance, focal motor weakness, paresthesia, depression, suicidal or homocidal ideation, feeling hopelessness.   Past Medical History  Diagnosis Date  . Breast cancer 1996    (Lt) breast ca dx 1996  . 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 Hyperlipidemia   . A-fib   . Arthritis     osteoarthritis  . IBD (inflammatory bowel disease)   . Gallstones   . Fracture of left hand   . Stroke     "mild stroke in 2012"  . Vertigo     hx of  . Pneumonia     hx of  . Anemia     hx of  . Blood transfusion     "in 1999"  . Urinary tract infection     hx of  . GERD (gastroesophageal reflux disease)   . Headache     "migraines hx of"  . Peripheral vascular disease     neuropathy in feet per patient  . Degenerative disc disease     hx of  . Hx of radiation therapy 07/2009 -08/2009, 04/2010- 05/2010    right chest wall    Past Surgical History  Procedure Date  . Mastectomy 1996  . Abdominal hysterectomy 1999  . Bowel resection 1999  . Cholecystectomy 2005  . 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 Sarcoma excision 12/27/10  rt axillary and back  . Eye surgery     bilateral cataract surgery  . Kyphosis surgery      x 3  . Mass excision 08/12/2011    sarcoma-Procedure: EXCISION MASS;  Surgeon: Liz Malady, MD;  Location: [facility];  Service: General;  Laterality: Right;  excision mass right paraspinous area, excision mass right chest wall    Current Outpatient Prescriptions  Medication Sig Dispense Refill  . alendronate (FOSAMAX) 70 MG tablet Take 70 mg by mouth every 7 (seven) days. Take with a full  glass of water on an empty stomach.      Marland Kitchen amiodarone (PACERONE) 200 MG tablet Take 200 mg by mouth daily.       Marland Kitchen aspirin 325 MG tablet Take 325 mg by mouth every evening.       . Calcium Carbonate-Vitamin D (CALTRATE 600+D PO) Take 1 tablet by mouth 2 (two) times daily.       . cycloSPORINE (RESTASIS) 0.05 % ophthalmic emulsion Place 1 drop into both eyes 2 (two) times daily.       Marland Kitchen docusate sodium (COLACE) 100 MG capsule Take 100 mg by mouth 2 (two) times daily.       . Multiple Vitamins-Iron (MULTIVITAMINS WITH IRON) TABS Take 1 tablet by mouth daily.      . nitroGLYCERIN (NITROSTAT) 0.4 MG SL tablet Place 0.4 mg under the tongue every 5 (five) minutes as needed. For Chest pain      . omeprazole (PRILOSEC) 20 MG capsule Take 20 mg by mouth daily.       . ondansetron (ZOFRAN) 4 MG tablet Take 4 mg by mouth every 6 (six) hours as needed. For nausea      . oxyCODONE-acetaminophen (PERCOCET) 5-325 MG per tablet Take 1 tablet by mouth every 4 (four) hours.       . traMADol (ULTRAM) 50 MG tablet Take 50 mg by mouth every 6 (six) hours as needed. For pain      . Wound Cleansers (RADIAPLEX EX) Apply topically.      . prochlorperazine (COMPAZINE) 10 MG tablet Take 10 mg by mouth every 6 (six) hours as needed. For nausea      . triamcinolone cream (KENALOG) 0.1 % Apply 1 application topically as needed. To itchy skin        ALLERGIES:  is allergic to amoxicillin; chocolate; peanut-containing drug products; adhesive; ampicillin; meperidine and related; morphine and related; nitrofurantoin; sulfa antibiotics; and tetracyclines & related.  REVIEW OF SYSTEMS:  The rest of the 14-point review of system was negative.   Filed Vitals:   11/29/11 1051  BP: 141/71  Pulse: 74  Temp: 97.1 F (36.2 C)  Resp: 18   Wt Readings from Last 3 Encounters:  11/29/11 101 lb 1.6 oz (45.859 kg)  11/04/11 102 lb (46.267 kg)  10/30/11 100 lb 6 oz (45.53 kg)   ECOG Performance status: 1  PHYSICAL EXAMINATION:    General:  Thin-appearing woman in no acute distress.  Eyes:  no scleral icterus.  ENT:  There were no oropharyngeal lesions.  Neck was without thyromegaly.  Lymphatics:  Negative cervical, supraclavicular or axillary adenopathy.  Respiratory: lungs were clear bilaterally without wheezing or crackles.  Cardiovascular:  Regular rate and rhythm, S1/S2, without murmur, rub or gallop.  There was no pedal edema.  GI:  abdomen was soft, flat, nontender, nondistended, without organomegaly.  Muscoloskeletal:  no spinal tenderness of palpation of vertebral spine. I could not identify any abnormal  mass along her right chest wall resection scar since there was all fibrosis.  Skin exam was without echymosis, petichae.  Neuro exam was nonfocal.  Patient was able to get on and off exam table without assistance.  Gait was normal.  Patient was alerted and oriented.  Attention was good.   Language was appropriate.  Mood was normal without depression.  Speech was not pressured.  Thought content was not tangential.     LABORATORY/RADIOLOGY DATA:  Lab Results  Component Value Date   WBC 5.6 11/29/2011   HGB 12.2 11/29/2011   HCT 37.9 11/29/2011   PLT 186 11/29/2011   GLUCOSE 91 08/12/2011   CHOL  Value: 232        ATP III CLASSIFICATION:  <200     mg/dL   Desirable  161-096  mg/dL   Borderline High  >=045    mg/dL   High       * 40/98/1191   TRIG 105 04/05/2010   HDL 74 04/05/2010   LDLCALC  Value: 137        Total Cholesterol/HDL:CHD Risk Coronary Heart Disease Risk Table                     Men   Women  1/2 Average Risk   3.4   3.3  Average Risk       5.0   4.4  2 X Average Risk   9.6   7.1  3 X Average Risk  23.4   11.0        Use the calculated Patient Ratio above and the CHD Risk Table to determine the patient's CHD Risk.        ATP III CLASSIFICATION (LDL):  <100     mg/dL   Optimal  478-295  mg/dL   Near or Above                    Optimal  130-159  mg/dL   Borderline  621-308  mg/dL   High  >657     mg/dL   Very  High* 84/69/6295   ALKPHOS 74 07/31/2011   ALT 11 07/31/2011   AST 18 07/31/2011   NA 141 08/12/2011   K 4.7 08/12/2011   CL 103 08/12/2011   CREATININE 0.69 08/12/2011   BUN 19 08/12/2011   CO2 28 08/12/2011   INR 0.93 04/11/2011    ASSESSMENT AND PLAN:   1.  History of recurrent sarcoma. S/P excision of the paraspinal mass followed by XRT.  Doing well at this time without any other concerning masses.   2.  Anemia secondary to most likely recent resection per her information. Resolved.   3.  Osteoarthritis, degenerative joint disease, and sacral compression fracture. She is on Percocet and Ultram. Pain in controlled.   4.  Atrial fibrillation. She is on amiodarone per cardiologist. She is off of Coumadin now due to risk of bleeding with recurrent sarcoma.   5.  History of osteoporosis. She is on alendronate per PCP.   6.  Follow up: in about 4 months.      The length of time of the face-to-face encounter was 15 minutes. More than 50% of time was spent counseling and coordination of care.

## 2011-11-29 NOTE — Telephone Encounter (Signed)
appts made and printed for pt aom °

## 2011-12-10 ENCOUNTER — Ambulatory Visit: Payer: Medicare Other | Admitting: Radiation Oncology

## 2011-12-11 ENCOUNTER — Encounter: Payer: Self-pay | Admitting: Radiation Oncology

## 2011-12-12 ENCOUNTER — Encounter: Payer: Self-pay | Admitting: Radiation Oncology

## 2011-12-12 ENCOUNTER — Ambulatory Visit
Admission: RE | Admit: 2011-12-12 | Discharge: 2011-12-12 | Disposition: A | Discharge status: Home / Self Care | Payer: Medicare Other | Source: Ambulatory Visit | Attending: Radiation Oncology | Admitting: Radiation Oncology

## 2011-12-12 VITALS — BP 161/81 | HR 77 | Temp 97.9°F | Resp 20 | Wt 100.7 lb

## 2011-12-12 DIAGNOSIS — C499 Malignant neoplasm of connective and soft tissue, unspecified: Secondary | ICD-10-CM

## 2011-12-12 NOTE — Progress Notes (Signed)
Patient here f/u rad tx right chest wall :09/23/11-11/04/11 5600 Gy 28 sessons Alert,oriented x3, no c/o pain now, but woke up and had right rib pain,changed positions and resolved stated patient, eating  Waning, "I;m making myself eat", No nausea, last mammogram 10/30/11, last seen Dr. Lodema Pilot PA Lisabeth Register on 11/29/11 11:53 AM

## 2011-12-12 NOTE — Progress Notes (Signed)
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 She does report occasional right chest wall discomfort but this is often positional. She sees [name] in 3 weeks and Dr. Gaylyn Rong again in mid December.  Physical examination: Alert and oriented. Wt Readings from Last 3 Encounters:  12/12/11 100 lb 11.2 oz (45.677 kg)  11/29/11 101 lb 1.6 oz (45.859 kg)  11/04/11 102 lb (46.267 kg)   Temp Readings from Last 3 Encounters:  12/12/11 97.9 F (36.6 C) Oral  11/29/11 97.1 F (36.2 C) Oral  10/30/11 98 F (36.7 C) Temporal   BP Readings from Last 3 Encounters:  12/12/11 161/81  11/29/11 141/71  10/30/11 138/70   Pulse Readings from Last 3 Encounters:  12/12/11 77  11/29/11 74  10/30/11 72   Nodes: Without palpable cervical, supraclavicular, or axillary lymphadenopathy. Chest: Diminished breath sounds on the right lower lung zone. There are scattered telangiectasias along her right chest wall with no visible or palpable evidence for recurrent disease. Her most recently treated field along the right paraspinal region is slightly indurated but there is no discrete mass or evidence for recurrent disease.  Impression: Satisfactory progress. No obvious evidence for persistent/recurrent disease.  Plan: Continue followup through [name] later this month and Dr. Gaylyn Rong in December. I've not scheduled Ms. Yniguez for a formal followup visit, but I would be more than happy to see her in the future should the need arise.

## 2011-12-17 ENCOUNTER — Telehealth (INDEPENDENT_AMBULATORY_CARE_PROVIDER_SITE_OTHER): Payer: Self-pay

## 2011-12-17 NOTE — Telephone Encounter (Signed)
I returned the call but had to leave a message.  I asked her to call me back in the morning

## 2011-12-17 NOTE — Telephone Encounter (Signed)
Message copied by Ivory Broad on Tue Dec 17, 2011  5:05 PM ------      Message from: Cathi Roan      Created: Tue Dec 17, 2011  4:33 PM      Regarding: Paper work      Contact: [phone]       Please call at this # [phone] about pathology report

## 2011-12-18 NOTE — Telephone Encounter (Signed)
The pt called me back but it turns out she needed an itemized list of her procedure.  I transferred her call to Lauren in billing to help with that.  She needs a copy of the pathology and a copy will be attached.

## 2012-01-01 ENCOUNTER — Encounter (INDEPENDENT_AMBULATORY_CARE_PROVIDER_SITE_OTHER): Payer: Self-pay | Admitting: General Surgery

## 2012-01-01 ENCOUNTER — Ambulatory Visit (INDEPENDENT_AMBULATORY_CARE_PROVIDER_SITE_OTHER): Payer: Medicare Other | Admitting: General Surgery

## 2012-01-01 VITALS — BP 120/76 | HR 72 | Temp 97.6°F | Resp 12 | Wt 100.0 lb

## 2012-01-01 DIAGNOSIS — C493 Malignant neoplasm of connective and soft tissue of thorax: Secondary | ICD-10-CM

## 2012-01-01 NOTE — Progress Notes (Signed)
Subjective:     Patient ID: Veronica Ellison, female   DOB: 1921/10/02, 78 y.o.   MRN: [id number]  HPI Patient presents for followup of right chest wall sarcoma. She's completed her radiation treatments since excision of a region of metastasis. She has found a new areas of concern. She is being treated for an ulcer on her right shin.  Review of Systems     Objective:   Physical Exam  Constitutional: She appears well-developed and well-nourished.  Pulmonary/Chest: Effort normal. No respiratory distress. She has no wheezes.  Abdominal: Soft. She exhibits no distension. There is no tenderness.   Paraspinous incision is well healed, no surrounding nodularity, no significant skin changes. 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 Small venous ulcer left shin-continue DuoDERM    Plan:     Return in 3 months. She said to follow up with Dr.Ha at that time as well.

## 2012-03-25 ENCOUNTER — Ambulatory Visit (INDEPENDENT_AMBULATORY_CARE_PROVIDER_SITE_OTHER): Payer: Medicare Other | Admitting: General Surgery

## 2012-03-25 ENCOUNTER — Encounter (INDEPENDENT_AMBULATORY_CARE_PROVIDER_SITE_OTHER): Payer: Self-pay | Admitting: General Surgery

## 2012-03-25 VITALS — BP 120/80 | HR 84 | Temp 97.0°F | Resp 18 | Wt 103.0 lb

## 2012-03-25 DIAGNOSIS — C493 Malignant neoplasm of connective and soft tissue of thorax: Secondary | ICD-10-CM

## 2012-03-25 NOTE — Progress Notes (Signed)
Subjective:     Patient ID: Veronica Ellison, female   DOB: 28-Mar-1922, 78 y.o.   MRN: [id number]  HPI Patient presents for followup of right chest wall sarcoma. She is selling her house and hurt her husband are moving in with her daughter. This created some stress recently. She has found no new lumps or areas of concern along her right side or back.  Review of Systems     Objective:   Physical Exam  Constitutional: She is oriented to person, place, and time.  Pulmonary/Chest: Effort normal. No respiratory distress.  Abdominal: Soft. There is no tenderness.  Neurological: She is alert and oriented to person, place, and time.  Right back along the paraspinous region extending to her right chest wall reveals no palpable mass, old scars are well-healed. Previous scar tissue along the chest wall is very similar to her last examination. No new nodularity is felt. No areas of significant tenderness are noted. Bilateral axilla without palpable abnormality No cervical adenopathy     Assessment:     History right chest wall sarcoma without evidence on exam of recurrent disease at this time    Plan:     Followup in 3 months or sooner as needed

## 2012-03-27 ENCOUNTER — Ambulatory Visit (HOSPITAL_BASED_OUTPATIENT_CLINIC_OR_DEPARTMENT_OTHER): Payer: Medicare Other | Admitting: Oncology

## 2012-03-27 ENCOUNTER — Telehealth: Payer: Self-pay | Admitting: Oncology

## 2012-03-27 ENCOUNTER — Other Ambulatory Visit (HOSPITAL_BASED_OUTPATIENT_CLINIC_OR_DEPARTMENT_OTHER): Payer: Medicare Other | Admitting: Lab

## 2012-03-27 VITALS — BP 161/72 | HR 68 | Temp 96.8°F | Resp 18 | Ht 61.5 in | Wt 103.0 lb

## 2012-03-27 DIAGNOSIS — L97909 Non-pressure chronic ulcer of unspecified part of unspecified lower leg with unspecified severity: Secondary | ICD-10-CM

## 2012-03-27 DIAGNOSIS — C493 Malignant neoplasm of connective and soft tissue of thorax: Secondary | ICD-10-CM

## 2012-03-27 DIAGNOSIS — I83009 Varicose veins of unspecified lower extremity with ulcer of unspecified site: Secondary | ICD-10-CM

## 2012-03-27 LAB — CBC WITH DIFFERENTIAL/PLATELET
BASO%: 0.5 % (ref 0.0–2.0)
EOS%: 1.1 % (ref 0.0–7.0)
HCT: 36.4 % (ref 34.8–46.6)
MCH: 33.5 pg (ref 25.1–34.0)
MCHC: 33.6 g/dL (ref 31.5–36.0)
NEUT%: 79.3 % — ABNORMAL HIGH (ref 38.4–76.8)
RBC: 3.65 10*6/uL — ABNORMAL LOW (ref 3.70–5.45)
lymph#: 0.9 10*3/uL (ref 0.9–3.3)

## 2012-03-27 LAB — COMPREHENSIVE METABOLIC PANEL (CC13)
ALT: 15 U/L (ref 0–55)
AST: 20 U/L (ref 5–34)
Calcium: 9.4 mg/dL (ref 8.4–10.4)
Chloride: 106 mEq/L (ref 98–107)
Creatinine: 0.8 mg/dL (ref 0.6–1.1)
Total Bilirubin: 0.4 mg/dL (ref 0.20–1.20)

## 2012-03-27 NOTE — Patient Instructions (Addendum)
1.  History of recurrent sarcoma. 2.  Status:  No evidence of recurrent disease today. 3.  Follow up:  With [name] in about 3 months.  With [place] in about 6 months.

## 2012-03-27 NOTE — Telephone Encounter (Signed)
appts made and printed for pt aom °

## 2012-03-28 NOTE — Progress Notes (Signed)
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 Pathologic stage pT2 cN0 M0. She had another recurrence outside of the previous field of radiation; and had resection on February 16, 2010 with same pathology but positive margin. She is also s/p adjuvant XRT for each time she had resection. She was started on 06/26/10 with adjuvant chemotherapy with continuous infusion of Adriamycin/Cytoxan over 7 days q4 wks. There was plan for at least 5 cycles. However, she developed recurrent mucositis, worsening performance status, and unrelated problem of severe back pain from degenerative joint disease. Therefore, adjuvant chemo was discontinued after two cycles. She had been on watchful observation until she developed local recurrent disease in August 2012. She underwent repeat resection on December 27, 2010. She was deemed not be be candidate for repeat radiation.   CURRENT THERAPY: watchful observation.   INTERVAL HISTORY: Veronica Ellison 78 y.o. female returns for regular follow up with her daughter.  She reports feeling well.  She lives with her daughter.  She is independent of activities of daily living.  She has chronic left chest wall pain from recurrent sarcoma.  She has not noticed any new chest wall lesion or soft tissue mass anywhere else on her body.  She denied cough, CP, hemoptysis.  She has had a purulent nonhealing ulcer in the right ankle for the last 3 months.  She denied pain, erythema.  She has been using a tegaderm dressing without improvement. The rest of the 14-point review of system was negative.    Past Medical History  Diagnosis Date  . Breast cancer 1996    (Lt) breast  ca dx 1996  . 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 Hyperlipidemia   . A-fib   . Arthritis     osteoarthritis  . IBD (inflammatory bowel disease)   . Gallstones   . Fracture of left hand   . Stroke     "mild stroke in 2012"  . Vertigo     hx of  . Pneumonia     hx of  . Anemia     hx of  . Blood transfusion     "in 1999"  . Urinary tract infection     hx of  . GERD (gastroesophageal reflux disease)   . Headache     "migraines hx of"  . Peripheral vascular disease     neuropathy in feet per patient  . Degenerative disc disease     hx of  . Hx of radiation therapy 07/2009 -08/2009, 04/2010- 05/2010    right chest wall  . History of radiation therapy 09/23/11-11/04/11    right paraspinal chest wall 560Gy     Past Surgical History  Procedure Date  . Mastectomy 1996  . Abdominal hysterectomy 1999  . Bowel resection 1999  . Cholecystectomy 2005  . 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 Sarcoma excision 12/27/10    rt axillary and back  . Eye surgery     bilateral cataract surgery  . Kyphosis surgery      x 3  . Mass excision 08/12/2011    sarcoma-Procedure: EXCISION  MASS;  Surgeon: Liz Malady, MD;  Location: [facility] OR;  Service: General;  Laterality: Right;  excision mass right paraspinous area, excision mass right chest wall    Current Outpatient Prescriptions  Medication Sig Dispense Refill  . alendronate (FOSAMAX) 70 MG tablet Take 70 mg by mouth every 7 (seven) days. Take with a full glass of water on an empty stomach.      Marland Kitchen amiodarone (PACERONE) 200 MG tablet Take 200 mg by mouth daily.       Marland Kitchen aspirin 325 MG tablet Take 325 mg by mouth every evening.       . Calcium Carbonate-Vitamin D (CALTRATE 600+D PO) Take 1 tablet by mouth 2 (two) times daily.       Marland Kitchen docusate sodium (COLACE) 100 MG capsule Take 100 mg by mouth 2 (two) times daily.       . Multiple Vitamins-Iron (MULTIVITAMINS WITH IRON)  TABS Take 1 tablet by mouth daily.      . nitroGLYCERIN (NITROSTAT) 0.4 MG SL tablet Place 0.4 mg under the tongue every 5 (five) minutes as needed. For Chest pain      . omeprazole (PRILOSEC) 20 MG capsule Take 20 mg by mouth daily.       . ondansetron (ZOFRAN) 4 MG tablet Take 4 mg by mouth every 6 (six) hours as needed. For nausea      . oxyCODONE-acetaminophen (PERCOCET) 7.5-325 MG per tablet Take 1 tablet by mouth every 6 (six) hours as needed. 1 to 2 every 6 hrs as needed      . prochlorperazine (COMPAZINE) 10 MG tablet Take 10 mg by mouth every 6 (six) hours as needed. For nausea      . traMADol (ULTRAM) 50 MG tablet Take 50 mg by mouth every 6 (six) hours as needed. For pain      . triamcinolone cream (KENALOG) 0.1 % Apply 1 application topically as needed. To itchy skin      . Wound Cleansers (RADIAPLEX EX) Apply topically.        ALLERGIES:  is allergic to amoxicillin; chocolate; peanut-containing drug products; adhesive; ampicillin; meperidine and related; morphine and related; nitrofurantoin; sulfa antibiotics; and tetracyclines & related.  REVIEW OF SYSTEMS:  The rest of the 14-point review of system was negative.   Filed Vitals:   03/27/12 1455  BP: 161/72  Pulse: 68  Temp: 96.8 F (36 C)  Resp: 18   Wt Readings from Last 3 Encounters:  03/27/12 103 lb (46.72 kg)  03/25/12 103 lb (46.72 kg)  01/01/12 100 lb (45.36 kg)   ECOG Performance status: 1  PHYSICAL EXAMINATION:   General:  Thin-appearing woman in no acute distress.  Eyes:  no scleral icterus.  ENT:  There were no oropharyngeal lesions.  Neck was without thyromegaly.  Lymphatics:  Negative cervical, supraclavicular or axillary adenopathy.  Respiratory: lungs were clear bilaterally without wheezing or crackles.  Cardiovascular:  Regular rate and rhythm, S1/S2, without murmur, rub or gallop.  There was no pedal edema.  GI:  abdomen was soft, flat, nontender, nondistended, without organomegaly.  Muscoloskeletal:  no  spinal tenderness of palpation of vertebral spine. There was scar in the posterior left chest wall, hyperkaratotic; no palpable mass or nodlue.  Skin exam was without echymosis, petichae.  Neuro exam was nonfocal.  Patient was able to get on and off exam table without assistance.  Gait was normal.  Patient was alerted and oriented.  Attention was good.   Language was appropriate.  Mood was normal without depression.  Speech was not pressured.  Thought content was not tangential.         LABORATORY/RADIOLOGY DATA:  Lab Results  Component Value Date   WBC 6.9 03/27/2012   HGB 12.2 03/27/2012   HCT 36.4 03/27/2012   PLT 207 03/27/2012   GLUCOSE 95 03/27/2012   CHOL  Value: 232        ATP III CLASSIFICATION:  <200     mg/dL   Desirable  454-098  mg/dL   Borderline High  >=119    mg/dL   High       * 14/78/2956   TRIG 105 04/05/2010   HDL 74 04/05/2010   LDLCALC  Value: 137        Total Cholesterol/HDL:CHD Risk Coronary Heart Disease Risk Table                     Men   Women  1/2 Average Risk   3.4   3.3  Average Risk       5.0   4.4  2 X Average Risk   9.6   7.1  3 X Average Risk  23.4   11.0        Use the calculated Patient Ratio above and the CHD Risk Table to determine the patient's CHD Risk.        ATP III CLASSIFICATION (LDL):  <100     mg/dL   Optimal  213-086  mg/dL   Near or Above                    Optimal  130-159  mg/dL   Borderline  578-469  mg/dL   High  >629     mg/dL   Very High* 52/84/1324   ALKPHOS 83 03/27/2012   ALT 15 03/27/2012   AST 20 03/27/2012   NA 143 03/27/2012   K 4.3 03/27/2012   CL 106 03/27/2012   CREATININE 0.8 03/27/2012   BUN 19.0 03/27/2012   CO2 27 03/27/2012   INR 0.93 04/11/2011    ASSESSMENT AND PLAN:   1.  History of recurrent sarcoma. No evidence of local recurrence on exam today.  She does not have symptoms suggesting of metastatic disease.   2.   Osteoarthritis, degenerative joint disease, and sacral compression fracture. She is on pain med  prn.   3.  Atrial fibrillation. She is on amiodarone. She is off of Coumadin now due to risk of bleeding.   5.  History of osteoporosis. She is on calcium/Vit D/alendronate per PCP.   6.  Nonhealing ulcer right ankle.  Most likely due to venous stasis ulcer.  I referred her to Wound care clinic.   7.  Follow up: in about 6 months.      The length of time of the face-to-face encounter was 15 minutes. More than 50% of time was spent counseling and coordination of care.

## 2012-04-16 ENCOUNTER — Encounter (HOSPITAL_BASED_OUTPATIENT_CLINIC_OR_DEPARTMENT_OTHER): Payer: Medicare PPO | Attending: Internal Medicine

## 2012-04-16 DIAGNOSIS — Z853 Personal history of malignant neoplasm of breast: Secondary | ICD-10-CM | POA: Insufficient documentation

## 2012-04-16 DIAGNOSIS — I872 Venous insufficiency (chronic) (peripheral): Secondary | ICD-10-CM | POA: Insufficient documentation

## 2012-04-16 DIAGNOSIS — E785 Hyperlipidemia, unspecified: Secondary | ICD-10-CM | POA: Insufficient documentation

## 2012-04-16 DIAGNOSIS — K219 Gastro-esophageal reflux disease without esophagitis: Secondary | ICD-10-CM | POA: Insufficient documentation

## 2012-04-16 DIAGNOSIS — L97809 Non-pressure chronic ulcer of other part of unspecified lower leg with unspecified severity: Secondary | ICD-10-CM | POA: Insufficient documentation

## 2012-04-17 NOTE — Progress Notes (Signed)
[facility]  NAME:  HAYDON, KALMAR NO.:  [id number]  MEDICAL RECORD NO.:  [id number]      DATE OF BIRTH:  1922/01/17  PHYSICIAN:  Maxwell Caul, M.D. VISIT DATE:  04/16/2012                                  OFFICE VISIT   HISTORY OF PRESENT ILLNESS:  Mrs. is a Veronica Ellison is a medically complex 79- year-old woman, who arrives in the [facility] Care Center today for review of a wound on her right lower leg.  This apparently started in October 2013 when she traumatized it on a door stop.  Since then, she has been cleaning this with soap and water using Polysporin and applying DuoDERM and this has not resulted in healing.  She does have a history of venous stasis, and I believe, has had some ulcerations that have usually healed in the past.  This is the 1st nonhealing wound she has had.  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  PAST SURGERY HISTORY:  Mastectomy, abdominal hysterectomy, cholecystectomy, squamous cell carcinoma excision in the right axilla and back, bilateral cataract surgery, kyphosis surgery, and kyphoplasty.  MEDICATION LIST:  Reviewed.  PHYSICAL EXAMINATION:  VITAL SIGNS:  Her temperature is 98.4, pulse 65, respirations 16, blood pressure 137/74. RESPIRATORY:  Shallow air entry bilaterally. CARDIAC:  There was no increase in her jugular venous pressure. Otherwise, reasonably normal exam. EXTREMITIES:  She has venous stasis.  Peripheral pulses were palpable.  IMPRESSION AND PLAN:  ABI is calculated in this  clinic was 1 on the right side and indicating normal perfusion.  Wound:  The area was on the anterior medial lower leg on the right measuring 2 x 1 5 x 1 x 0.1.  This was lightly debrided of adherence surface slough.  There is likely more debridement going to be required here.  The dressing that we ordered today was Santyl foam, Vaseline gauze with a Profore Lite wrap.  We are going to try to leave this on for the week.  Once proper debridement is done of this wound we would probably use a collagen based dressing.  She will be reviewed again in a week.          ______________________________ Maxwell Caul, M.D.     MGR/MEDQ  D:  04/16/2012  T:  04/17/2012  Job:  [id number]

## 2012-05-14 ENCOUNTER — Encounter (HOSPITAL_BASED_OUTPATIENT_CLINIC_OR_DEPARTMENT_OTHER): Payer: Medicare PPO | Attending: Internal Medicine

## 2012-05-14 DIAGNOSIS — S81009A Unspecified open wound, unspecified knee, initial encounter: Secondary | ICD-10-CM | POA: Insufficient documentation

## 2012-05-14 DIAGNOSIS — X58XXXA Exposure to other specified factors, initial encounter: Secondary | ICD-10-CM | POA: Insufficient documentation

## 2012-05-14 DIAGNOSIS — I872 Venous insufficiency (chronic) (peripheral): Secondary | ICD-10-CM | POA: Insufficient documentation

## 2012-05-14 DIAGNOSIS — L97809 Non-pressure chronic ulcer of other part of unspecified lower leg with unspecified severity: Secondary | ICD-10-CM | POA: Insufficient documentation

## 2012-05-23 ENCOUNTER — Other Ambulatory Visit: Payer: Self-pay

## 2012-06-11 ENCOUNTER — Encounter (HOSPITAL_BASED_OUTPATIENT_CLINIC_OR_DEPARTMENT_OTHER): Payer: Medicare PPO | Attending: Internal Medicine

## 2012-06-11 DIAGNOSIS — L97809 Non-pressure chronic ulcer of other part of unspecified lower leg with unspecified severity: Secondary | ICD-10-CM | POA: Insufficient documentation

## 2012-06-11 DIAGNOSIS — I872 Venous insufficiency (chronic) (peripheral): Secondary | ICD-10-CM | POA: Insufficient documentation

## 2012-07-08 ENCOUNTER — Ambulatory Visit (INDEPENDENT_AMBULATORY_CARE_PROVIDER_SITE_OTHER): Payer: Medicare Other | Admitting: General Surgery

## 2012-07-08 ENCOUNTER — Other Ambulatory Visit (INDEPENDENT_AMBULATORY_CARE_PROVIDER_SITE_OTHER): Payer: Self-pay | Admitting: General Surgery

## 2012-07-08 ENCOUNTER — Encounter (INDEPENDENT_AMBULATORY_CARE_PROVIDER_SITE_OTHER): Payer: Self-pay | Admitting: General Surgery

## 2012-07-08 VITALS — BP 116/64 | HR 66 | Temp 98.2°F | Resp 12 | Ht 61.0 in | Wt 104.0 lb

## 2012-07-08 DIAGNOSIS — R229 Localized swelling, mass and lump, unspecified: Secondary | ICD-10-CM

## 2012-07-08 DIAGNOSIS — C493 Malignant neoplasm of connective and soft tissue of thorax: Secondary | ICD-10-CM

## 2012-07-08 DIAGNOSIS — C469 Kaposi's sarcoma, unspecified: Secondary | ICD-10-CM

## 2012-07-08 DIAGNOSIS — Z8589 Personal history of malignant neoplasm of other organs and systems: Secondary | ICD-10-CM

## 2012-07-08 NOTE — Progress Notes (Signed)
Subjective:     Patient ID: Veronica Ellison, female   DOB: 1921/07/29, 79 y.o.   MRN: [id number]  HPI  Patient is here for followup of right chest wall sarcoma. She has been doing well. Unfortunately, her husband was admitted to the hospital after a fall and is now at skilled nursing. That has been a stressor for her. She's otherwise doing okay. She did, however, feel a small area at the lateral aspect of her wide excision chest wall scar. This was tender last week. She is on no other areas of concern. Review of Systems     Objective:   Physical Exam  Constitutional: She appears well-developed. No distress.  HENT:  Head: Normocephalic.  Eyes: EOM are normal. Pupils are equal, round, and reactive to light.  Neck: Normal range of motion.  Cardiovascular: Normal rate and regular rhythm.   Pulmonary/Chest: Effort normal and breath sounds normal.    Tender vague nodule at posterior most aspect of previous reexcision scar. Fine-needle aspiration was done in this location: area was prepped in sterile fashion. 1% lidocaine with epinephrine was injected. 18-gauge needle was used to do several passes. Slides were prepared and sent to pathology. A Band-Aid was applied.  Abdominal: Soft. She exhibits no distension. There is no tenderness.  Remainder of chest wall excision site has scar tissue but no new or abnormal nodularity, paraspinous region is also soft and has no nodularity or mass. Right axilla has old scar but no mass or nodularity there. Left chest wall reveals no mass or lesion     Assessment:     Soft tissue sarcoma right chest wall status post multiple re\re excisions and radiation treatment now with tender nodule    Plan:     Fine-needle aspiration biopsy as above. No other areas are of concern. We will followup pathology and plan accordingly. If negative, would still like to see her back in 8 weeks to see if this area has changed.

## 2012-07-09 ENCOUNTER — Other Ambulatory Visit (INDEPENDENT_AMBULATORY_CARE_PROVIDER_SITE_OTHER): Payer: Self-pay | Admitting: General Surgery

## 2012-07-09 ENCOUNTER — Other Ambulatory Visit (HOSPITAL_COMMUNITY)
Admission: RE | Admit: 2012-07-09 | Discharge: 2012-07-09 | Disposition: A | Discharge status: Home / Self Care | Payer: Medicare PPO | Source: Ambulatory Visit | Attending: General Surgery | Admitting: General Surgery

## 2012-07-09 DIAGNOSIS — C493 Malignant neoplasm of connective and soft tissue of thorax: Secondary | ICD-10-CM

## 2012-07-11 ENCOUNTER — Telehealth: Payer: Self-pay | Admitting: General Surgery

## 2012-07-11 NOTE — Telephone Encounter (Signed)
I called her daughter and gave her the pathology results from the FNA.  No malignant cells.

## 2012-07-27 ENCOUNTER — Other Ambulatory Visit (HOSPITAL_BASED_OUTPATIENT_CLINIC_OR_DEPARTMENT_OTHER): Payer: Medicare PPO | Admitting: Lab

## 2012-07-27 ENCOUNTER — Encounter: Payer: Self-pay | Admitting: Lab

## 2012-07-27 ENCOUNTER — Encounter: Payer: Self-pay | Admitting: Oncology

## 2012-07-27 DIAGNOSIS — I83009 Varicose veins of unspecified lower extremity with ulcer of unspecified site: Secondary | ICD-10-CM

## 2012-07-27 DIAGNOSIS — C493 Malignant neoplasm of connective and soft tissue of thorax: Secondary | ICD-10-CM

## 2012-07-27 LAB — CBC WITH DIFFERENTIAL/PLATELET
BASO%: 0.3 % (ref 0.0–2.0)
EOS%: 1.6 % (ref 0.0–7.0)
HCT: 35.3 % (ref 34.8–46.6)
LYMPH%: 19.7 % (ref 14.0–49.7)
MCH: 32 pg (ref 25.1–34.0)
MCHC: 32.7 g/dL (ref 31.5–36.0)
NEUT%: 69.7 % (ref 38.4–76.8)
Platelets: 185 10*3/uL (ref 145–400)

## 2012-08-05 ENCOUNTER — Ambulatory Visit (INDEPENDENT_AMBULATORY_CARE_PROVIDER_SITE_OTHER): Payer: Medicare Other | Admitting: General Surgery

## 2012-08-05 ENCOUNTER — Encounter (INDEPENDENT_AMBULATORY_CARE_PROVIDER_SITE_OTHER): Payer: Self-pay | Admitting: General Surgery

## 2012-08-05 VITALS — BP 150/82 | HR 72 | Temp 97.2°F | Resp 18 | Wt 105.0 lb

## 2012-08-05 DIAGNOSIS — C493 Malignant neoplasm of connective and soft tissue of thorax: Secondary | ICD-10-CM

## 2012-08-05 NOTE — Progress Notes (Signed)
Patient ID: Veronica Ellison, female   DOB: 01/05/1922, 79 y.o.   MRN: [id number]  Chief Complaint  Patient presents with  . Mass    HPI Tinea A [name] is a 79 y.o. female.  Chief complaint: New right axillary nodule HPI I have been following the patient for many years for recurrent right chest wall sarcoma. I just saw her a few weeks ago. An area along her chest wall scar was fine needle aspirated. This was negative. Very recently, she noticed a nodule develop in her right axilla. It is mildly tender. There's been no drainage. Past Medical History  Diagnosis Date  . Breast cancer 1996    (Lt) breast ca dx 1996  . 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 Hyperlipidemia   . A-fib   . Arthritis     osteoarthritis  . IBD (inflammatory bowel disease)   . Gallstones   . Fracture of left hand   . Stroke     "mild stroke in 2012"  . Vertigo     hx of  . Pneumonia     hx of  . Anemia     hx of  . Blood transfusion     "in 1999"  . Urinary tract infection     hx of  . GERD (gastroesophageal reflux disease)   . Headache     "migraines hx of"  . Peripheral vascular disease     neuropathy in feet per patient  . Degenerative disc disease     hx of  . Hx of radiation therapy 07/2009 -08/2009, 04/2010- 05/2010    right chest wall  . History of radiation therapy 09/23/11-11/04/11    right paraspinal chest wall 560Gy     Past Surgical History  Procedure Laterality Date  . Mastectomy  1996  . Abdominal hysterectomy  1999  . Bowel resection  1999  . Cholecystectomy  2005  . 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 Sarcoma excision  12/27/10    rt axillary and back  . Eye surgery      bilateral cataract surgery  . Kyphosis surgery       x 3  . Mass excision  08/12/2011    sarcoma-Procedure: EXCISION MASS;  Surgeon: Rees Matura E Maki Sweetser, MD;  Location: [facility] OR;  Service: General;  Laterality: Right;  excision mass right  paraspinous area, excision mass right chest wall    Family History  Problem Relation Age of Onset  . Heart disease Mother   . Diabetes Mother   . Heart disease Father   . Kidney disease Father   . Hypertension Father   . Heart disease Sister   . Cancer Brother     prostate  . Heart disease Brother   . Cancer Son     pancreatic    Social History History  Substance Use Topics  . Smoking status: Never Smoker   . Smokeless tobacco: Never Used  . Alcohol Use: No    Allergies  Allergen Reactions  . Amoxicillin Diarrhea  . Chocolate Other (See Comments)    fever blisters  . Peanut-Containing Drug Products Other (See Comments)    Fever blisters  . Adhesive (Tape) Other (See Comments)    Tears skin  . Ampicillin Other (See Comments)    unknown  . Meperidine And Related Itching  . Morphine And Related Other (See Comments)      Hallucinations  . Nitrofurantoin Other (See Comments)    Drug fever  . Sulfa Antibiotics Rash  . Tetracyclines & Related Rash    Causes bumps    Current Outpatient Prescriptions  Medication Sig Dispense Refill  . alendronate (FOSAMAX) 70 MG tablet Take 70 mg by mouth every 7 (seven) days. Take with a full glass of water on an empty stomach.      . amiodarone (PACERONE) 200 MG tablet Take 200 mg by mouth daily.       . aspirin 325 MG tablet Take 325 mg by mouth every evening.       . Calcium Carbonate-Vitamin D (CALTRATE 600+D PO) Take 1 tablet by mouth 2 (two) times daily.       . docusate sodium (COLACE) 100 MG capsule Take 100 mg by mouth 2 (two) times daily.       . Multiple Vitamins-Iron (MULTIVITAMINS WITH IRON) TABS Take 1 tablet by mouth daily.      . nitroGLYCERIN (NITROSTAT) 0.4 MG SL tablet Place 0.4 mg under the tongue every 5 (five) minutes as needed. For Chest pain      . omeprazole (PRILOSEC) 20 MG capsule Take 20 mg by mouth daily.       . ondansetron (ZOFRAN) 4 MG tablet Take 4 mg by mouth every 6 (six) hours as needed. For nausea       . oxyCODONE-acetaminophen (PERCOCET) 7.5-325 MG per tablet Take 1 tablet by mouth every 6 (six) hours as needed. 1 to 2 every 6 hrs as needed      . OXYCONTIN 10 MG T12A       . prochlorperazine (COMPAZINE) 10 MG tablet Take 10 mg by mouth every 6 (six) hours as needed. For nausea      . sertraline (ZOLOFT) 50 MG tablet       . traMADol (ULTRAM) 50 MG tablet Take 50 mg by mouth every 6 (six) hours as needed. For pain      . triamcinolone cream (KENALOG) 0.1 % Apply 1 application topically as needed. To itchy skin      . Wound Cleansers (RADIAPLEX EX) Apply topically.       No current facility-administered medications for this visit.    Review of Systems Review of Systems  Constitutional: Negative for fever, chills and unexpected weight change.  HENT: Negative for hearing loss, congestion, sore throat, trouble swallowing and voice change.   Eyes: Negative for visual disturbance.  Respiratory: Negative for cough and wheezing.   Cardiovascular: Negative for chest pain, palpitations and leg swelling.  Gastrointestinal: Negative for nausea, vomiting, abdominal pain, diarrhea, constipation, blood in stool, abdominal distention and anal bleeding.  Genitourinary: Negative for hematuria, vaginal bleeding and difficulty urinating.  Musculoskeletal: Positive for back pain, arthralgias and gait problem.       See history of present illness  Skin: Negative for rash and wound.  Neurological: Negative for seizures, syncope and headaches.  Hematological: Negative for adenopathy. Does not bruise/bleed easily.  Psychiatric/Behavioral: Negative for confusion.    Blood pressure 150/82, pulse 72, temperature 97.2 F (36.2 C), temperature source Oral, resp. rate 18, weight 105 lb (47.628 kg).  Physical Exam Physical Exam  Constitutional: She is oriented to person, place, and time. She appears well-developed. No distress.  HENT:  Head: Normocephalic.  Eyes: EOM are normal. Pupils are equal, round,  and reactive to light.  Neck: Normal range of motion. Neck supple. No tracheal deviation present.  Cardiovascular: Normal rate and normal heart sounds.     Pulmonary/Chest: Effort normal. No stridor. No respiratory distress. She has no wheezes.    Old scar area extending along right chest wall with no new nodularity, 1.5 cm mass right axilla anterior to the edge of the latissimus  Abdominal: Soft. She exhibits no distension. There is no tenderness.  Neurological: She is alert and oriented to person, place, and time.  Skin: Skin is warm.    Assessment    Right axillary muscular nodule with history of recurrent chest wall sarcoma, concern for metastasis    Plan    I've offered wide excision in the operating room under general anesthesia. Procedure, risks, and benefits were discussed in detail with the patient. She will need to hold her aspirin for 5 days prior to surgery. She is agreeable.       Barnell Shieh E 08/05/2012, 9:41 AM    

## 2012-08-11 ENCOUNTER — Encounter (HOSPITAL_BASED_OUTPATIENT_CLINIC_OR_DEPARTMENT_OTHER): Payer: Self-pay | Admitting: *Deleted

## 2012-08-11 NOTE — Progress Notes (Signed)
Pt pretty sharp for 89-living with daughter now since husband recovering from hip surgery-chronic back pain-sees dr Almond Lint for af-takes asa-off for surgery-to come in for bmet-called for cardio[facility] notes-pt has had surgery here in past

## 2012-08-12 ENCOUNTER — Encounter (HOSPITAL_BASED_OUTPATIENT_CLINIC_OR_DEPARTMENT_OTHER)
Admission: RE | Admit: 2012-08-12 | Discharge: 2012-08-12 | Disposition: A | Discharge status: Home / Self Care | Payer: Medicare PPO | Source: Ambulatory Visit | Attending: General Surgery | Admitting: General Surgery

## 2012-08-12 ENCOUNTER — Telehealth (INDEPENDENT_AMBULATORY_CARE_PROVIDER_SITE_OTHER): Payer: Self-pay | Admitting: General Surgery

## 2012-08-12 LAB — BASIC METABOLIC PANEL WITH GFR
BUN: 20 mg/dL (ref 6–23)
CO2: 26 meq/L (ref 19–32)
Calcium: 9.4 mg/dL (ref 8.4–10.5)
Chloride: 102 meq/L (ref 96–112)
Creatinine, Ser: 0.76 mg/dL (ref 0.50–1.10)
GFR calc Af Amer: 84 mL/min — ABNORMAL LOW
GFR calc non Af Amer: 73 mL/min — ABNORMAL LOW
Glucose, Bld: 92 mg/dL (ref 70–99)
Potassium: 4.6 meq/L (ref 3.5–5.1)
Sodium: 136 meq/L (ref 135–145)

## 2012-08-12 NOTE — Telephone Encounter (Signed)
Patient's daughter called in to inform us that her mother is having surgery on Friday for a mass excision and that they have found another new spot on her back.  They wanted to know if it could be removed at the same time.  I informed her that I would forward this message onto [name] and that as soon as we hear something back, his nurse would get in touch with them.

## 2012-08-13 ENCOUNTER — Encounter (HOSPITAL_BASED_OUTPATIENT_CLINIC_OR_DEPARTMENT_OTHER): Payer: Self-pay | Admitting: *Deleted

## 2012-08-13 ENCOUNTER — Other Ambulatory Visit (INDEPENDENT_AMBULATORY_CARE_PROVIDER_SITE_OTHER): Payer: Self-pay | Admitting: General Surgery

## 2012-08-13 NOTE — Telephone Encounter (Signed)
I notified the daughter Veronica Ellison

## 2012-08-13 NOTE — Telephone Encounter (Signed)
Please let the [facility] know I will remove both masses - I will update consent order.

## 2012-08-14 ENCOUNTER — Encounter (HOSPITAL_BASED_OUTPATIENT_CLINIC_OR_DEPARTMENT_OTHER): Payer: Self-pay | Admitting: Certified Registered Nurse Anesthetist

## 2012-08-14 ENCOUNTER — Ambulatory Visit (HOSPITAL_BASED_OUTPATIENT_CLINIC_OR_DEPARTMENT_OTHER)
Admission: RE | Admit: 2012-08-14 | Discharge: 2012-08-14 | Disposition: A | Discharge status: Home / Self Care | Payer: Medicare PPO | Source: Ambulatory Visit | Attending: General Surgery | Admitting: General Surgery

## 2012-08-14 ENCOUNTER — Encounter (HOSPITAL_BASED_OUTPATIENT_CLINIC_OR_DEPARTMENT_OTHER)
Admission: RE | Disposition: A | Discharge status: Home / Self Care | Payer: Self-pay | Source: Ambulatory Visit | Attending: General Surgery

## 2012-08-14 ENCOUNTER — Encounter (HOSPITAL_BASED_OUTPATIENT_CLINIC_OR_DEPARTMENT_OTHER): Payer: Self-pay | Admitting: *Deleted

## 2012-08-14 ENCOUNTER — Ambulatory Visit (HOSPITAL_BASED_OUTPATIENT_CLINIC_OR_DEPARTMENT_OTHER): Payer: Medicare PPO | Admitting: Certified Registered Nurse Anesthetist

## 2012-08-14 DIAGNOSIS — E785 Hyperlipidemia, unspecified: Secondary | ICD-10-CM | POA: Insufficient documentation

## 2012-08-14 DIAGNOSIS — C499 Malignant neoplasm of connective and soft tissue, unspecified: Secondary | ICD-10-CM

## 2012-08-14 DIAGNOSIS — C496 Malignant neoplasm of connective and soft tissue of trunk, unspecified: Secondary | ICD-10-CM

## 2012-08-14 DIAGNOSIS — Z853 Personal history of malignant neoplasm of breast: Secondary | ICD-10-CM | POA: Insufficient documentation

## 2012-08-14 DIAGNOSIS — C493 Malignant neoplasm of connective and soft tissue of thorax: Secondary | ICD-10-CM

## 2012-08-14 DIAGNOSIS — K219 Gastro-esophageal reflux disease without esophagitis: Secondary | ICD-10-CM | POA: Insufficient documentation

## 2012-08-14 DIAGNOSIS — Z8673 Personal history of transient ischemic attack (TIA), and cerebral infarction without residual deficits: Secondary | ICD-10-CM | POA: Insufficient documentation

## 2012-08-14 DIAGNOSIS — I739 Peripheral vascular disease, unspecified: Secondary | ICD-10-CM | POA: Insufficient documentation

## 2012-08-14 DIAGNOSIS — I4891 Unspecified atrial fibrillation: Secondary | ICD-10-CM | POA: Insufficient documentation

## 2012-08-14 DIAGNOSIS — M1990 Unspecified osteoarthritis, unspecified site: Secondary | ICD-10-CM | POA: Insufficient documentation

## 2012-08-14 HISTORY — PX: MASS EXCISION: SHX2000

## 2012-08-14 HISTORY — DX: Presence of spectacles and contact lenses: Z97.3

## 2012-08-14 HISTORY — DX: Other chronic pain: G89.29

## 2012-08-14 HISTORY — DX: Unspecified hearing loss, unspecified ear: H91.90

## 2012-08-14 HISTORY — DX: Dorsalgia, unspecified: M54.9

## 2012-08-14 SURGERY — EXCISION MASS
Anesthesia: General | Site: Axilla | Laterality: Right | Wound class: Clean

## 2012-08-14 MED ORDER — VANCOMYCIN HCL IN DEXTROSE 1-5 GM/200ML-% IV SOLN
1000.0000 mg | INTRAVENOUS | Status: AC
  Administered 2012-08-14: 1000 mg via INTRAVENOUS

## 2012-08-14 MED ORDER — FENTANYL CITRATE 0.05 MG/ML IJ SOLN
25.0000 ug | INTRAMUSCULAR | Status: DC | PRN
  Administered 2012-08-14: 25 ug via INTRAVENOUS

## 2012-08-14 MED ORDER — ONDANSETRON HCL 4 MG/2ML IJ SOLN
INTRAMUSCULAR | Status: DC | PRN
  Administered 2012-08-14: 4 mg via INTRAVENOUS

## 2012-08-14 MED ORDER — FENTANYL CITRATE 0.05 MG/ML IJ SOLN
INTRAMUSCULAR | Status: DC | PRN
  Administered 2012-08-14: 50 ug via INTRAVENOUS

## 2012-08-14 MED ORDER — DEXAMETHASONE SODIUM PHOSPHATE 4 MG/ML IJ SOLN
INTRAMUSCULAR | Status: DC | PRN
  Administered 2012-08-14: 4 mg via INTRAVENOUS

## 2012-08-14 MED ORDER — OXYCODONE HCL 5 MG PO TABS: 5.0000 mg | ORAL_TABLET | Freq: Once | ORAL | Status: DC | PRN

## 2012-08-14 MED ORDER — MIDAZOLAM HCL 2 MG/2ML IJ SOLN: 1.0000 mg | INTRAMUSCULAR | Status: DC | PRN

## 2012-08-14 MED ORDER — LACTATED RINGERS IV SOLN
INTRAVENOUS | Status: DC
  Administered 2012-08-14: 08:00:00 via INTRAVENOUS

## 2012-08-14 MED ORDER — ONDANSETRON HCL 4 MG/2ML IJ SOLN: 4.0000 mg | Freq: Four times a day (QID) | INTRAMUSCULAR | Status: DC | PRN

## 2012-08-14 MED ORDER — 0.9 % SODIUM CHLORIDE (POUR BTL) OPTIME
TOPICAL | Status: DC | PRN
  Administered 2012-08-14: 1000 mL

## 2012-08-14 MED ORDER — CHLORHEXIDINE GLUCONATE 4 % EX LIQD: 1.0000 "application " | Freq: Once | CUTANEOUS | Status: DC

## 2012-08-14 MED ORDER — OXYCODONE HCL 5 MG/5ML PO SOLN: 5.0000 mg | Freq: Once | ORAL | Status: DC | PRN

## 2012-08-14 MED ORDER — OXYCODONE-ACETAMINOPHEN 7.5-325 MG PO TABS: 1.0000 | ORAL_TABLET | Freq: Four times a day (QID) | ORAL | Status: AC | PRN

## 2012-08-14 MED ORDER — LABETALOL HCL 5 MG/ML IV SOLN: 10.0000 mg | Freq: Once | INTRAVENOUS | Status: DC

## 2012-08-14 MED ORDER — BUPIVACAINE-EPINEPHRINE 0.5% -1:200000 IJ SOLN
INTRAMUSCULAR | Status: DC | PRN
  Administered 2012-08-14: 10 mL

## 2012-08-14 MED ORDER — PROPOFOL 10 MG/ML IV BOLUS
INTRAVENOUS | Status: DC | PRN
  Administered 2012-08-14: 150 mg via INTRAVENOUS

## 2012-08-14 MED ORDER — ONDANSETRON HCL 4 MG PO TABS: 4.0000 mg | ORAL_TABLET | Freq: Three times a day (TID) | ORAL | Status: DC | PRN

## 2012-08-14 MED ORDER — FENTANYL CITRATE 0.05 MG/ML IJ SOLN: 50.0000 ug | INTRAMUSCULAR | Status: DC | PRN

## 2012-08-14 MED ORDER — LIDOCAINE HCL (CARDIAC) 20 MG/ML IV SOLN
INTRAVENOUS | Status: DC | PRN
  Administered 2012-08-14: 60 mg via INTRAVENOUS

## 2012-08-14 SURGICAL SUPPLY — 55 items
ADH SKN CLS APL DERMABOND .7 (GAUZE/BANDAGES/DRESSINGS) ×2
APL SKNCLS STERI-STRIP NONHPOA (GAUZE/BANDAGES/DRESSINGS)
BENZOIN TINCTURE PRP APPL 2/3 (GAUZE/BANDAGES/DRESSINGS) IMPLANT
BLADE HEX COATED 2.75 (ELECTRODE) ×2 IMPLANT
BLADE SURG 15 STRL LF DISP TIS (BLADE) ×1 IMPLANT
BLADE SURG 15 STRL SS (BLADE) ×2
BLADE SURG ROTATE 9660 (MISCELLANEOUS) IMPLANT
CANISTER SUCTION 1200CC (MISCELLANEOUS) ×1 IMPLANT
CHLORAPREP W/TINT 26ML (MISCELLANEOUS) ×2 IMPLANT
CLOTH BEACON ORANGE TIMEOUT ST (SAFETY) ×2 IMPLANT
COVER MAYO STAND STRL (DRAPES) ×2 IMPLANT
COVER TABLE BACK 60X90 (DRAPES) ×2 IMPLANT
DECANTER SPIKE VIAL GLASS SM (MISCELLANEOUS) ×2 IMPLANT
DERMABOND ADVANCED (GAUZE/BANDAGES/DRESSINGS) ×2
DERMABOND ADVANCED .7 DNX12 (GAUZE/BANDAGES/DRESSINGS) IMPLANT
DRAPE PED LAPAROTOMY (DRAPES) ×2 IMPLANT
DRAPE U-SHAPE 76X120 STRL (DRAPES) ×1 IMPLANT
DRAPE UTILITY XL STRL (DRAPES) ×2 IMPLANT
ELECT REM PT RETURN 9FT ADLT (ELECTROSURGICAL) ×2
ELECTRODE REM PT RTRN 9FT ADLT (ELECTROSURGICAL) ×1 IMPLANT
GAUZE SPONGE 4X4 12PLY STRL LF (GAUZE/BANDAGES/DRESSINGS) ×2 IMPLANT
GLOVE BIO SURGEON STRL SZ8 (GLOVE) ×4 IMPLANT
GLOVE BIOGEL PI IND STRL 8 (GLOVE) ×1 IMPLANT
GLOVE BIOGEL PI INDICATOR 8 (GLOVE) ×1
GLOVE ECLIPSE 6.5 STRL STRAW (GLOVE) ×2 IMPLANT
GLOVE INDICATOR 6.5 STRL GRN (GLOVE) ×1 IMPLANT
GOWN PREVENTION PLUS XLARGE (GOWN DISPOSABLE) ×2 IMPLANT
GOWN PREVENTION PLUS XXLARGE (GOWN DISPOSABLE) ×2 IMPLANT
NDL HYPO 25X1 1.5 SAFETY (NEEDLE) ×1 IMPLANT
NEEDLE 27GAX1X1/2 (NEEDLE) IMPLANT
NEEDLE HYPO 25X1 1.5 SAFETY (NEEDLE) ×2 IMPLANT
NS IRRIG 1000ML POUR BTL (IV SOLUTION) IMPLANT
PACK BASIN DAY SURGERY FS (CUSTOM PROCEDURE TRAY) ×2 IMPLANT
PENCIL BUTTON HOLSTER BLD 10FT (ELECTRODE) ×2 IMPLANT
SHEET MEDIUM DRAPE 40X70 STRL (DRAPES) ×1 IMPLANT
SLEEVE SCD COMPRESS KNEE MED (MISCELLANEOUS) ×1 IMPLANT
SPONGE GAUZE 4X4 12PLY (GAUZE/BANDAGES/DRESSINGS) IMPLANT
SPONGE LAP 4X18 X RAY DECT (DISPOSABLE) ×2 IMPLANT
STRIP CLOSURE SKIN 1/2X4 (GAUZE/BANDAGES/DRESSINGS) IMPLANT
SUT ETHILON 3 0 PS 1 (SUTURE) IMPLANT
SUT ETHILON 5 0 PS 2 18 (SUTURE) IMPLANT
SUT MON AB 4-0 PC3 18 (SUTURE) ×1 IMPLANT
SUT SILK 2 0 FS (SUTURE) ×1 IMPLANT
SUT VIC AB 3-0 PS1 18 (SUTURE) ×2
SUT VIC AB 3-0 PS1 18XBRD (SUTURE) IMPLANT
SUT VIC AB 3-0 SH 27 (SUTURE) ×2
SUT VIC AB 3-0 SH 27X BRD (SUTURE) IMPLANT
SUT VIC AB 4-0 SH 18 (SUTURE) ×2 IMPLANT
SUT VIC AB 5-0 PS2 18 (SUTURE) IMPLANT
SYR BULB 3OZ (MISCELLANEOUS) ×1 IMPLANT
SYR CONTROL 10ML LL (SYRINGE) ×2 IMPLANT
TOWEL OR 17X24 6PK STRL BLUE (TOWEL DISPOSABLE) ×4 IMPLANT
TOWEL OR NON WOVEN STRL DISP B (DISPOSABLE) ×2 IMPLANT
TUBE CONNECTING 20X1/4 (TUBING) ×1 IMPLANT
YANKAUER SUCT BULB TIP NO VENT (SUCTIONS) ×1 IMPLANT

## 2012-08-14 NOTE — Transfer of Care (Signed)
Immediate Anesthesia Transfer of Care Note  Patient: Veronica Ellison  Procedure(s) Performed: Procedure(s): EXCISION MASS RIGHT AXIULA and right back (Right)  Patient Location: PACU  Anesthesia Type:General  Level of Consciousness: awake, alert , oriented and patient cooperative  Airway & Oxygen Therapy: Patient Spontanous Breathing and Patient connected to face mask oxygen  Post-op Assessment: Report given to PACU RN and Post -op Vital signs reviewed and stable  Post vital signs: Reviewed and stable  Complications: No apparent anesthesia complications

## 2012-08-14 NOTE — Anesthesia Postprocedure Evaluation (Signed)
Anesthesia Post Note  Patient: Veronica Ellison  Procedure(s) Performed: Procedure(s) (LRB): EXCISION MASS RIGHT AXIULA and right back (Right)  Anesthesia type: General  Patient location: PACU  Post pain: Pain level controlled and Adequate analgesia  Post assessment: Post-op Vital signs reviewed, Patient's Cardiovascular Status Stable, Respiratory Function Stable, Patent Airway and Pain level controlled  Last Vitals:  Filed Vitals:   08/14/12 1030  BP: 194/82  Pulse: 78  Temp:   Resp: 20    Post vital signs: Reviewed and stable  Level of consciousness: awake, alert  and oriented  Complications: No apparent anesthesia complications

## 2012-08-14 NOTE — Anesthesia Procedure Notes (Signed)
Procedure Name: LMA Insertion Date/Time: 08/14/2012 8:24 AM Performed by: Kilah Drahos D Pre-anesthesia Checklist: Patient identified, Emergency Drugs available, Suction available and Patient being monitored Patient Re-evaluated:Patient Re-evaluated prior to inductionOxygen Delivery Method: Circle System Utilized Preoxygenation: Pre-oxygenation with 100% oxygen Intubation Type: IV induction Ventilation: Mask ventilation without difficulty LMA: LMA inserted LMA Size: 4.0 Number of attempts: 1 Airway Equipment and Method: bite block Placement Confirmation: positive ETCO2 Tube secured with: Tape Dental Injury: Teeth and Oropharynx as per pre-operative assessment

## 2012-08-14 NOTE — Interval H&P Note (Signed)
History and Physical Interval Note: patient called this week C/O a new nodule on her back and she requests that be removed as well.  On exam: 1cm nodule near paraspinous edge of previous scar area R back. Will remove as well. Consent was prepared appropriately and informed consent done.  08/14/2012 8:01 AM  Veronica Ellison  has presented today for surgery, with the diagnosis of MASS RIGHT AXIULA  The various methods of treatment have been discussed with the patient and family. After consideration of risks, benefits and other options for treatment, the patient has consented to  Procedure(s): EXCISION MASS RIGHT AXIULA (Right) and excision mass back.as a surgical intervention .  The patient's history has been reviewed, patient re-examined, sites marked, stable for surgery.  I have reviewed the patient's chart and labs.  Questions were answered to the patient's satisfaction.     Lanny Lipkin E

## 2012-08-14 NOTE — Anesthesia Preprocedure Evaluation (Signed)
Anesthesia Evaluation  Patient identified by MRN, date of birth, ID band Patient awake    Reviewed: Allergy & Precautions, H&P , NPO status , Patient's Chart, lab work & pertinent test results  Airway Mallampati: II  Neck ROM: full    Dental   Pulmonary          Cardiovascular + Peripheral Vascular Disease + dysrhythmias Atrial Fibrillation     Neuro/Psych  Headaches, TIA   GI/Hepatic GERD-  ,  Endo/Other    Renal/GU      Musculoskeletal  (+) Arthritis -, Osteoarthritis,    Abdominal   Peds  Hematology   Anesthesia Other Findings   Reproductive/Obstetrics                           Anesthesia Physical Anesthesia Plan  ASA: III  Anesthesia Plan: General   Post-op Pain Management:    Induction: Intravenous  Airway Management Planned: LMA  Additional Equipment:   Intra-op Plan:   Post-operative Plan:   Informed Consent: I have reviewed the patients History and Physical, chart, labs and discussed the procedure including the risks, benefits and alternatives for the proposed anesthesia with the patient or authorized representative who has indicated his/her understanding and acceptance.     Plan Discussed with: CRNA, Anesthesiologist and Surgeon  Anesthesia Plan Comments:         Anesthesia Quick Evaluation

## 2012-08-14 NOTE — Op Note (Addendum)
08/14/2012  9:34 AM  PATIENT:  Veronica Ellison  79 y.o. female  PRE-OPERATIVE DIAGNOSIS:  Masses Right Axilla and Back , history of sarcoma  POST-OPERATIVE DIAGNOSIS:  Masses Right Axilla and Back, history of sarcoma  PROCEDURE:  Procedure(s): EXCISION MASS RIGHT AXIULA 5cm with layered closure and excision mass right back 1cm with layered closure  SURGEON:  Surgeon(s): Liz Malady, MD  PHYSICIAN ASSISTANT:   ASSISTANTS: none   ANESTHESIA:   local and general  EBL:  Total I/O In: 400 [I.V.:400] Out: -   BLOOD ADMINISTERED:none  DRAINS: none   SPECIMEN:  Excision  DISPOSITION OF SPECIMEN:  PATHOLOGY  COUNTS:  YES  DICTATION: .Dragon Dictation  Patient has a history of right chest wall sarcoma with multiple local recurrences and a regional metastasis to her right axilla Which will been previously excised. She has also had XRT several times.  She developed a new nodule in her right axilla as well as a new nodule in the paraspinous region on her right back. She's presents for excision of both of these areas. Both are highly suspicious for sarcoma. She was identified in the preop holding area. Informed consent was obtained. The sites were marked. She received intravenous antibiotics. She was brought to the operating room. General anesthesia with laryngeal mask airway was a minister by the anesthesia staff. She was placed in right lateral position. Right axilla and right back were prepped and draped in sterile fashion. Time out procedure was done. Attention was first directed to the axillary mass. Local anesthetic was injected. Transverse incision was made over the palpable mass. Subcutaneous tissues were dissected down revealing this circumscribed tip of a mass. Further dissection revealed that this was actually a portion of the much larger 5 cm axillary mass running along her musculature. This was carefully bluntly dissected down, however, was very friable and grossly consistent  with tumor. Areas of long thoracic and thoracodorsal nerve were avoided.The full resection was deemed impossible without significant impairment to arm functionality.  All gross tumor was excised and sent to pathology. Margins were not sent as they would likely be positive. Cautery was used to get good hemostasis.Wound was closed in layers with deep tissues approximated with running 3-0 Vicryl. Skin was closed with running 4-0 Monocryl followed by Dermabond. We changed our gloves. Attention was directed to the back mass. Incision was made directly over the top.  Palpable 1 cm mass was circumferentially dissected with. Tissue around it. Was excised and marked for orientation for pathology. Hemostasis was obtained. Additional deep and lateral margins were taken and sent to pathology. No other gross palpable tissue was palpable in the area. Wound was irrigated. Hemostasis obtained. Wound was closed in layers with deep tissues approximated with running 3-0 Vicryl and skin closed with running 4-0 Monocryl followed by Dermabond. All counts were correct. Patient tolerated procedure well without apparent competitions taken recovery in stable condition.  PATIENT DISPOSITION:  PACU - hemodynamically stable.   Delay start of Pharmacological VTE agent (>24hrs) due to surgical blood loss or risk of bleeding:  no  Violeta Gelinas, MD, MPH, FACS Pager: [phone]  5/9/20149:34 AM

## 2012-08-14 NOTE — H&P (View-Only) (Signed)
Patient ID: Veronica Ellison, female   DOB: [birth year], 79 y.o.   MRN: [id number]  Chief Complaint  Patient presents with  . Mass    HPI SONIYAH Ellison is a 79 y.o. female.  Chief complaint: New right axillary nodule HPI I have been following the patient for many years for recurrent right chest wall sarcoma. I just saw her a few weeks ago. An area along her chest wall scar was fine needle aspirated. This was negative. Very recently, she noticed a nodule develop in her right axilla. It is mildly tender. There's been no drainage. Past Medical History  Diagnosis Date  . Breast cancer 1996    (Lt) breast ca dx 1996  . 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 Hyperlipidemia   . A-fib   . Arthritis     osteoarthritis  . IBD (inflammatory bowel disease)   . Gallstones   . Fracture of left hand   . Stroke     "mild stroke in 2012"  . Vertigo     hx of  . Pneumonia     hx of  . Anemia     hx of  . Blood transfusion     "in 1999"  . Urinary tract infection     hx of  . GERD (gastroesophageal reflux disease)   . Headache     "migraines hx of"  . Peripheral vascular disease     neuropathy in feet per patient  . Degenerative disc disease     hx of  . Hx of radiation therapy 07/2009 -08/2009, 04/2010- 05/2010    right chest wall  . History of radiation therapy 09/23/11-11/04/11    right paraspinal chest wall 560Gy     Past Surgical History  Procedure Laterality Date  . Mastectomy  1996  . Abdominal hysterectomy  1999  . Bowel resection  1999  . Cholecystectomy  2005  . 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 Sarcoma excision  12/27/10    rt axillary and back  . Eye surgery      bilateral cataract surgery  . Kyphosis surgery       x 3  . Mass excision  08/12/2011    sarcoma-Procedure: EXCISION MASS;  Surgeon: Liz Malady, MD;  Location: [facility] OR;  Service: General;  Laterality: Right;  excision mass right  paraspinous area, excision mass right chest wall    Family History  Problem Relation Age of Onset  . Heart disease Mother   . Diabetes Mother   . Heart disease Father   . Kidney disease Father   . Hypertension Father   . Heart disease Sister   . Cancer Brother     prostate  . Heart disease Brother   . Cancer Son     pancreatic    Social History History  Substance Use Topics  . Smoking status: Never Smoker   . Smokeless tobacco: Never Used  . Alcohol Use: No    Allergies  Allergen Reactions  . Amoxicillin Diarrhea  . Chocolate Other (See Comments)    fever blisters  . Peanut-Containing Drug Products Other (See Comments)    Fever blisters  . Adhesive (Tape) Other (See Comments)    Tears skin  . Ampicillin Other (See Comments)    unknown  . Meperidine And Related Itching  . Morphine And Related Other (See Comments)  Hallucinations  . Nitrofurantoin Other (See Comments)    Drug fever  . Sulfa Antibiotics Rash  . Tetracyclines & Related Rash    Causes bumps    Current Outpatient Prescriptions  Medication Sig Dispense Refill  . alendronate (FOSAMAX) 70 MG tablet Take 70 mg by mouth every 7 (seven) days. Take with a full glass of water on an empty stomach.      Marland Kitchen amiodarone (PACERONE) 200 MG tablet Take 200 mg by mouth daily.       Marland Kitchen aspirin 325 MG tablet Take 325 mg by mouth every evening.       . Calcium Carbonate-Vitamin D (CALTRATE 600+D PO) Take 1 tablet by mouth 2 (two) times daily.       Marland Kitchen docusate sodium (COLACE) 100 MG capsule Take 100 mg by mouth 2 (two) times daily.       . Multiple Vitamins-Iron (MULTIVITAMINS WITH IRON) TABS Take 1 tablet by mouth daily.      . nitroGLYCERIN (NITROSTAT) 0.4 MG SL tablet Place 0.4 mg under the tongue every 5 (five) minutes as needed. For Chest pain      . omeprazole (PRILOSEC) 20 MG capsule Take 20 mg by mouth daily.       . ondansetron (ZOFRAN) 4 MG tablet Take 4 mg by mouth every 6 (six) hours as needed. For nausea       . oxyCODONE-acetaminophen (PERCOCET) 7.5-325 MG per tablet Take 1 tablet by mouth every 6 (six) hours as needed. 1 to 2 every 6 hrs as needed      . OXYCONTIN 10 MG T12A       . prochlorperazine (COMPAZINE) 10 MG tablet Take 10 mg by mouth every 6 (six) hours as needed. For nausea      . sertraline (ZOLOFT) 50 MG tablet       . traMADol (ULTRAM) 50 MG tablet Take 50 mg by mouth every 6 (six) hours as needed. For pain      . triamcinolone cream (KENALOG) 0.1 % Apply 1 application topically as needed. To itchy skin      . Wound Cleansers (RADIAPLEX EX) Apply topically.       No current facility-administered medications for this visit.    Review of Systems Review of Systems  Constitutional: Negative for fever, chills and unexpected weight change.  HENT: Negative for hearing loss, congestion, sore throat, trouble swallowing and voice change.   Eyes: Negative for visual disturbance.  Respiratory: Negative for cough and wheezing.   Cardiovascular: Negative for chest pain, palpitations and leg swelling.  Gastrointestinal: Negative for nausea, vomiting, abdominal pain, diarrhea, constipation, blood in stool, abdominal distention and anal bleeding.  Genitourinary: Negative for hematuria, vaginal bleeding and difficulty urinating.  Musculoskeletal: Positive for back pain, arthralgias and gait problem.       See history of present illness  Skin: Negative for rash and wound.  Neurological: Negative for seizures, syncope and headaches.  Hematological: Negative for adenopathy. Does not bruise/bleed easily.  Psychiatric/Behavioral: Negative for confusion.    Blood pressure 150/82, pulse 72, temperature 97.2 F (36.2 C), temperature source Oral, resp. rate 18, weight 105 lb (47.628 kg).  Physical Exam Physical Exam  Constitutional: She is oriented to person, place, and time. She appears well-developed. No distress.  HENT:  Head: Normocephalic.  Eyes: EOM are normal. Pupils are equal, round,  and reactive to light.  Neck: Normal range of motion. Neck supple. No tracheal deviation present.  Cardiovascular: Normal rate and normal heart sounds.  Pulmonary/Chest: Effort normal. No stridor. No respiratory distress. She has no wheezes.    Old scar area extending along right chest wall with no new nodularity, 1.5 cm mass right axilla anterior to the edge of the latissimus  Abdominal: Soft. She exhibits no distension. There is no tenderness.  Neurological: She is alert and oriented to person, place, and time.  Skin: Skin is warm.    Assessment    Right axillary muscular nodule with history of recurrent chest wall sarcoma, concern for metastasis    Plan    I've offered wide excision in the operating room under general anesthesia. Procedure, risks, and benefits were discussed in detail with the patient. She will need to hold her aspirin for 5 days prior to surgery. She is agreeable.       Faris Coolman E 08/05/2012, 9:41 AM

## 2012-08-17 ENCOUNTER — Encounter (HOSPITAL_BASED_OUTPATIENT_CLINIC_OR_DEPARTMENT_OTHER): Payer: Self-pay | Admitting: General Surgery

## 2012-08-17 LAB — POCT HEMOGLOBIN-HEMACUE: Hemoglobin: 13.5 g/dL (ref 12.0–15.0)

## 2012-08-18 ENCOUNTER — Telehealth: Payer: Self-pay | Admitting: General Surgery

## 2012-08-18 ENCOUNTER — Telehealth (INDEPENDENT_AMBULATORY_CARE_PROVIDER_SITE_OTHER): Payer: Self-pay

## 2012-08-18 NOTE — Telephone Encounter (Signed)
Patient daughter Rosalie Doctor) calling for pathology results.  Please call to discuss results

## 2012-08-18 NOTE — Telephone Encounter (Signed)
I called Veronica Ellison and reviewed her mother's pathology.  We discussed the plan of care.  Will discuss further on Veronica Ellison's F/U visit.

## 2012-08-19 ENCOUNTER — Ambulatory Visit (INDEPENDENT_AMBULATORY_CARE_PROVIDER_SITE_OTHER): Payer: Medicare PPO | Admitting: General Surgery

## 2012-08-19 ENCOUNTER — Encounter (INDEPENDENT_AMBULATORY_CARE_PROVIDER_SITE_OTHER): Payer: Medicare PPO | Admitting: General Surgery

## 2012-08-19 ENCOUNTER — Encounter (INDEPENDENT_AMBULATORY_CARE_PROVIDER_SITE_OTHER): Payer: Self-pay | Admitting: General Surgery

## 2012-08-19 VITALS — BP 120/60 | HR 63 | Temp 98.0°F | Resp 18 | Ht 61.0 in | Wt 105.2 lb

## 2012-08-19 DIAGNOSIS — Z09 Encounter for follow-up examination after completed treatment for conditions other than malignant neoplasm: Secondary | ICD-10-CM

## 2012-08-19 NOTE — Progress Notes (Signed)
Subjective:     Patient ID: Veronica Ellison, female   DOB: Feb 23, 1922, 79 y.o.   MRN: [id number]  HPI 79 year old female status post a right axillary to right back mass or hospital so sarcomas. She is a patient of Veronica Ellison. She returns today after about 24 history of some increasing bruising around her right axillary incision as well as some bloody drainage from the incision yesterday. This drainage is now stopped. She complains of pain at this site. No fevers.  Review of Systems     Objective:   Physical Exam Back incision without infection and small hematoma Right axillary incision with large area of ecchymosis and small hematoma, no infection, wound is intact and there is not any drainage right now    Assessment:     Axillary hematoma     Plan:     I told her to keep this covered in to try to wear either a sports bra/bar or get some compression on this area. I think this will slowly just heal on its own. She is going to take it easy with the arm. She will see Veronica Ellison in the near future. I told her she develops a fever, more drainage, redness around the wound please call sooner and come back. There is no indication for any antibiotics or any further treatment at this point in time.

## 2012-08-24 ENCOUNTER — Encounter (INDEPENDENT_AMBULATORY_CARE_PROVIDER_SITE_OTHER): Payer: Self-pay | Admitting: General Surgery

## 2012-08-24 ENCOUNTER — Ambulatory Visit (INDEPENDENT_AMBULATORY_CARE_PROVIDER_SITE_OTHER): Payer: Medicare PPO | Admitting: General Surgery

## 2012-08-24 VITALS — BP 127/70 | HR 72 | Temp 98.1°F | Resp 14 | Ht 61.5 in | Wt 105.2 lb

## 2012-08-24 DIAGNOSIS — C493 Malignant neoplasm of connective and soft tissue of thorax: Secondary | ICD-10-CM

## 2012-08-24 NOTE — Progress Notes (Signed)
Subjective:     Patient ID: Veronica Ellison, female   DOB: [birth year], 79 y.o.   MRN: [id number]  HPI S/p excision regional metastatic R chest wall sarcoma R back and R axilla. Was seen by Dr Dwain Sarna in urgent office for R axillary drainage.  Still draining some.  Mild pain.  Review of Systems     Objective:   Physical Exam R axilla small opening on wound laterally, evolving ecchymoses arm and chest wall, no cellulitis, minimal SS D/C.  R back some edema at incision but no evidence of infection.    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  We spoke at length regarding her pathology report with gross positive margins.  She does not wish to undergo further surgery and I doubt, as previously, full clear margins are attainable outsie of a huge chest wall resection which she would not survive.  Her daughter accompanies her.  I would recommend re-evaluation by Dr. Dayton Scrape from radiation oncology, for possible XRT - especially to the axillary area.  They are considering that.  Return 09/02/12.

## 2012-09-02 ENCOUNTER — Ambulatory Visit (INDEPENDENT_AMBULATORY_CARE_PROVIDER_SITE_OTHER): Payer: Medicare PPO | Admitting: General Surgery

## 2012-09-02 ENCOUNTER — Encounter (INDEPENDENT_AMBULATORY_CARE_PROVIDER_SITE_OTHER): Payer: Self-pay | Admitting: General Surgery

## 2012-09-02 ENCOUNTER — Telehealth (INDEPENDENT_AMBULATORY_CARE_PROVIDER_SITE_OTHER): Payer: Self-pay | Admitting: General Surgery

## 2012-09-02 VITALS — BP 120/78 | HR 78 | Temp 98.4°F | Resp 18 | Wt 104.0 lb

## 2012-09-02 DIAGNOSIS — C493 Malignant neoplasm of connective and soft tissue of thorax: Secondary | ICD-10-CM

## 2012-09-02 NOTE — Progress Notes (Signed)
Subjective:     Patient ID: Veronica Ellison, female   DOB: 05-Feb-1922, 78 y.o.   MRN: [id number]  HPI To further drainage from her right axillary incision. Mild soreness at incision sites. Otherwise feeling better. She has been considering reevaluation for radiation therapy since her last visit.  Review of Systems     Objective:   Physical Exam Right axillary incision is now completely healed. No drainage. No signs of ongoing infection. Ecchymosis posterior aspect  right upper arm is also an reabsorbing. Right paraspinous incision clean dry and intact with no signs of infection. Mild edema.    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 We'll plan referral back to Dr. Dayton Scrape for consideration of radiation therapy. It may be possible, at least for the axillary region. I will see her back in about 6 weeks. Plan was discussed in detail with the patient and her daughter who accompanies her.

## 2012-09-02 NOTE — Telephone Encounter (Signed)
Spoke with patients daughter Misty Stanley she is aware of Rad Oncology appt  On 09/03/12 at 8:30  With Dr Tawni Millers . I also gave the daughter the number the  For rad Oncology she may need to change appt for another day .

## 2012-09-02 NOTE — Addendum Note (Signed)
Addended by: Liz Malady on: 09/02/2012 11:33 AM   Modules accepted: Orders

## 2012-09-10 ENCOUNTER — Ambulatory Visit
Admission: RE | Admit: 2012-09-10 | Discharge: 2012-09-10 | Disposition: A | Discharge status: Home / Self Care | Payer: Medicare PPO | Source: Ambulatory Visit | Attending: Radiation Oncology | Admitting: Radiation Oncology

## 2012-09-10 ENCOUNTER — Encounter: Payer: Self-pay | Admitting: Radiation Oncology

## 2012-09-10 VITALS — BP 161/80 | HR 79 | Temp 98.2°F | Resp 20 | Ht 61.5 in | Wt 106.2 lb

## 2012-09-10 DIAGNOSIS — C499 Malignant neoplasm of connective and soft tissue, unspecified: Secondary | ICD-10-CM

## 2012-09-10 DIAGNOSIS — C493 Malignant neoplasm of connective and soft tissue of thorax: Secondary | ICD-10-CM | POA: Insufficient documentation

## 2012-09-10 DIAGNOSIS — Z923 Personal history of irradiation: Secondary | ICD-10-CM | POA: Insufficient documentation

## 2012-09-10 NOTE — Progress Notes (Signed)
Please see the Nurse Progress Note in the MD Initial Consult Encounter for this patient. 

## 2012-09-10 NOTE — Progress Notes (Signed)
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 Completed radiation to right paraspinal posterior chest 5600 cGy on 11/04/11. Soft tissue, fine needle aspiration of right chest wall on 07/09/12. Right axillary incision on 08/14/12 by Dr.Burke Janee Morn. Patient ambulated with walker steady gait, depression, on zoloft past couple months, spouse still in rehab broken hip 2 months, celebrated their 71st anniversary May 1,2014, loss appetite, occasional short of breath about "once a month at least" states patient, tenderness and soreness under right axilla where last excision was done 08/14/12

## 2012-09-10 NOTE — Progress Notes (Addendum)
CC Dr. Violeta Gelinas, Dr. Cam Hai  Followup note:  [name] returns today at the request of [name] for consideration of postoperative radiation therapy following in the management of her recurrent soft tissue sarcoma. She received previous radiation therapy as follows:  From April to May 2011 she received 4000 cGy to her right posterior lateral chest wall in 20 sessions of followed by 2000 cGy 10 sessions of 9 MEV electrons for a cumulative dose of 6600 cGy in 33 sessions.  In January of 2012 she received electron beam radiation therapy to her posterior lateral chest wall initially receiving 5000 cGy followed by a boost of 1000 cGy cumulative dose of 6000 cGy 30 sessions, overlapping with her initial treatment 2011 the first 5000 cGy.  In July of 2013 she underwent right paraspinal electron beam radiation therapy, initially receiving 4400 cGy in 20 sessions overlapping her previous electron beam field then moving off area of overlap for an additional 1200 cGy in 6 sessions for cumulative dose of 5600 cGy in 28 sessions.  We were contemplating treating her lower right axilla in October 2012, but there was overlap with her previous right chest wall photon field. The initial right chest wall photon field extended superiorly to the level of the carina.  She went approximately one year without a recurrence, but [name] noted a new nodule along her right paraspinal region and also right axilla. She was taken to the operating room on 08/14/2012 where he grossly excised both masses. The right axilla mass was much larger than 5 cm and read along the musculature. Resection was not possible, and gross tumor was sent for pathology. Margins were obviously positive the right paraspinal mass was also excised and she apparently underwent reexcision with atypical spindle cells are seen at the deep margin.  She is without complaints today and appears well. She is asymptomatic.  Physical  examination: Alert oriented. Filed Vitals:   09/10/12 1337  BP: 161/80  Pulse: 79  Temp: 98.2 F (36.8 C)  Resp: 20   Head and neck examination: Grossly unremarkable. Nodes: Without palpable cervical, supraclavicular or discrete axillary lymphadenopathy. On palpation of the right axilla there is diffuse thickening of the mid to posterior axilla which is quite asymmetric compared to her left axilla. Of note is that this was not previously irradiated. Chest no visible or palpable evidence for recurrent disease. Along the right parasternal region is a vertical wound which appears to be healing well to 2.5 cm from the midline. This was within her electron beam field that received 5600 cGy in October of 2012. Lungs are clear. Extremities: Without edema. Neurologic examination: Grossly nonfocal  Avapro data: Lab Results  Component Value Date   WBC 7.1 07/27/2012   HGB 13.5 08/14/2012   HCT 35.3 07/27/2012   MCV 97.8 07/27/2012   PLT 185 07/27/2012   Impression: Recurrent soft tissue sarcoma on the right axilla and right paraspinal region. The right paraspinal region involving appears to be the same area that was previously treated. This received 5600 cGy in 28 sessions. I reviewed her previous treatment fields, the right axilla down to the level of the carina with her arms extended has not been irradiated. Therefore, she is a candidate for right axillary radiation therapy. We could give a further 06-3998 cGy electrons to her right paraspinal region, but I would simply follow this for the time being and treat if she develops gross disease. I left a message with her daughter, Veronica Ellison (phone number  [phone]) to indicate that she is a candidate for radiation therapy to her right axilla which is site that is more likely cause her problems into the future.  Plan: As discussed above. She may come in for simulation/treatment planning next week. Lastly, we will perform a CT scan for treatment planning purposes and see  if there is metastatic disease to her lungs.   40 minutes was spent face-to-face the patient, primarily counseling the patient in reviewing her fields and coordinating her care.

## 2012-09-10 NOTE — Addendum Note (Signed)
Encounter addended by: Maryln Gottron, MD on: 09/10/2012  3:53 PM<BR>     Documentation filed: Notes Section

## 2012-09-15 ENCOUNTER — Ambulatory Visit
Admission: RE | Admit: 2012-09-15 | Discharge: 2012-09-15 | Disposition: A | Discharge status: Home / Self Care | Payer: Medicare PPO | Source: Ambulatory Visit | Attending: Radiation Oncology | Admitting: Radiation Oncology

## 2012-09-15 DIAGNOSIS — Z510 Encounter for antineoplastic radiation therapy: Secondary | ICD-10-CM | POA: Insufficient documentation

## 2012-09-15 DIAGNOSIS — R110 Nausea: Secondary | ICD-10-CM | POA: Insufficient documentation

## 2012-09-15 DIAGNOSIS — Y842 Radiological procedure and radiotherapy as the cause of abnormal reaction of the patient, or of later complication, without mention of misadventure at the time of the procedure: Secondary | ICD-10-CM | POA: Insufficient documentation

## 2012-09-15 DIAGNOSIS — L988 Other specified disorders of the skin and subcutaneous tissue: Secondary | ICD-10-CM | POA: Insufficient documentation

## 2012-09-15 DIAGNOSIS — C493 Malignant neoplasm of connective and soft tissue of thorax: Secondary | ICD-10-CM | POA: Insufficient documentation

## 2012-09-15 NOTE — Progress Notes (Signed)
Complex simulation/treatment planning note: The patient was taken to the CT simulator. She was placed on a custom breast board and a custom Acuform was constructed for immobilization. Her previous treatment tattoo was marked with a BB. Her right axilla scar was marked with a wire. She was then scanned. Her normal anatomy was contoured including her carina. She was setup to AP and PA fields with the inferior aspect of the level of the carina which represents the superior border of her previous right chest wall fields. 2 separate multileaf collimators were designed to conform the field. I prescribing 5000 cGy 25 sessions utilizing 6 MEV photons. She may then undergo a boost of 600 cGy 3 sessions. This represents a special treatment procedure because of the complex but he required in view of her previous radiation therapy to her right chest wall. He care of be taken to make sure that we do not overlap with her previous right chest wall field and we will review once again her MIM plans.

## 2012-09-16 ENCOUNTER — Encounter: Payer: Self-pay | Admitting: Radiation Oncology

## 2012-09-16 NOTE — Progress Notes (Signed)
Ms. Enlow completed her treatment planning today. We performed a MIM fusion looking at the 50% isodose curve for the current plan and the previous chest wall plan (course #1). At approximately 2.7 cm between both fields, so extended the inferior border of the current plan by 1.0 cm which encompasses almost the entire right axilla tumor bed. This fusion and extra planning constitutes a special treatment procedure.

## 2012-09-21 ENCOUNTER — Encounter: Payer: Self-pay | Admitting: Radiation Oncology

## 2012-09-21 NOTE — Progress Notes (Signed)
Approval received through [employer], #[id number] 06.17.2014

## 2012-09-22 ENCOUNTER — Ambulatory Visit
Admission: RE | Admit: 2012-09-22 | Discharge: 2012-09-22 | Disposition: A | Discharge status: Home / Self Care | Payer: Medicare PPO | Source: Ambulatory Visit | Attending: Radiation Oncology | Admitting: Radiation Oncology

## 2012-09-22 DIAGNOSIS — C493 Malignant neoplasm of connective and soft tissue of thorax: Secondary | ICD-10-CM

## 2012-09-22 NOTE — Progress Notes (Signed)
Simulation verification note: The patient underwent simulation verification for treatment to her right axilla. Her isocenter is in good position and the multileaf collimators contoured the intended treatment volume appropriately.

## 2012-09-23 ENCOUNTER — Ambulatory Visit
Admission: RE | Admit: 2012-09-23 | Discharge: 2012-09-23 | Disposition: A | Discharge status: Home / Self Care | Payer: Medicare PPO | Source: Ambulatory Visit | Attending: Radiation Oncology | Admitting: Radiation Oncology

## 2012-09-24 ENCOUNTER — Ambulatory Visit
Admission: RE | Admit: 2012-09-24 | Discharge: 2012-09-24 | Disposition: A | Discharge status: Home / Self Care | Payer: Medicare PPO | Source: Ambulatory Visit | Attending: Radiation Oncology | Admitting: Radiation Oncology

## 2012-09-25 ENCOUNTER — Ambulatory Visit
Admission: RE | Admit: 2012-09-25 | Discharge: 2012-09-25 | Disposition: A | Discharge status: Home / Self Care | Payer: Medicare PPO | Source: Ambulatory Visit | Attending: Radiation Oncology | Admitting: Radiation Oncology

## 2012-09-25 DIAGNOSIS — C493 Malignant neoplasm of connective and soft tissue of thorax: Secondary | ICD-10-CM

## 2012-09-25 MED ORDER — RADIAPLEXRX EX GEL
Freq: Once | CUTANEOUS | Status: AC
  Administered 2012-09-25: 15:00:00 via TOPICAL

## 2012-09-25 MED ORDER — ALRA NON-METALLIC DEODORANT (RAD-ONC)
1.0000 "application " | Freq: Once | TOPICAL | Status: AC
  Administered 2012-09-25: 1 via TOPICAL

## 2012-09-25 NOTE — Progress Notes (Signed)
Routine of clinic reviewed with patient and daughter.Given alra and radiaplex.Reviewed side effects of care.Patient familiar with radiation as had previously.Main changes will be related to skin and fatigue.

## 2012-09-28 ENCOUNTER — Observation Stay (HOSPITAL_COMMUNITY)
Admission: EM | Admit: 2012-09-28 | Discharge: 2012-09-30 | Disposition: A | Discharge status: Home / Self Care | Payer: Medicare PPO | Attending: Internal Medicine | Admitting: Internal Medicine

## 2012-09-28 ENCOUNTER — Encounter: Payer: Self-pay | Admitting: Radiation Oncology

## 2012-09-28 ENCOUNTER — Ambulatory Visit (HOSPITAL_BASED_OUTPATIENT_CLINIC_OR_DEPARTMENT_OTHER): Payer: Medicare PPO | Admitting: Oncology

## 2012-09-28 ENCOUNTER — Other Ambulatory Visit (HOSPITAL_BASED_OUTPATIENT_CLINIC_OR_DEPARTMENT_OTHER): Payer: Medicare PPO | Admitting: Lab

## 2012-09-28 ENCOUNTER — Other Ambulatory Visit: Payer: Self-pay

## 2012-09-28 ENCOUNTER — Encounter: Payer: Self-pay | Admitting: Oncology

## 2012-09-28 ENCOUNTER — Ambulatory Visit
Admission: RE | Admit: 2012-09-28 | Discharge: 2012-09-28 | Disposition: A | Discharge status: Home / Self Care | Payer: Medicare PPO | Source: Ambulatory Visit | Attending: Radiation Oncology | Admitting: Radiation Oncology

## 2012-09-28 ENCOUNTER — Telehealth: Payer: Self-pay | Admitting: Oncology

## 2012-09-28 ENCOUNTER — Encounter (HOSPITAL_COMMUNITY): Payer: Self-pay | Admitting: Emergency Medicine

## 2012-09-28 ENCOUNTER — Emergency Department (HOSPITAL_COMMUNITY): Payer: Medicare PPO

## 2012-09-28 ENCOUNTER — Ambulatory Visit: Payer: Medicare PPO | Admitting: Lab

## 2012-09-28 VITALS — BP 134/88 | HR 74 | Temp 96.3°F | Resp 18 | Ht 61.0 in | Wt 105.3 lb

## 2012-09-28 VITALS — BP 135/74 | HR 59 | Temp 98.6°F | Resp 20 | Wt 104.9 lb

## 2012-09-28 DIAGNOSIS — I4891 Unspecified atrial fibrillation: Secondary | ICD-10-CM | POA: Insufficient documentation

## 2012-09-28 DIAGNOSIS — C493 Malignant neoplasm of connective and soft tissue of thorax: Secondary | ICD-10-CM

## 2012-09-28 DIAGNOSIS — K219 Gastro-esophageal reflux disease without esophagitis: Secondary | ICD-10-CM | POA: Insufficient documentation

## 2012-09-28 DIAGNOSIS — C50919 Malignant neoplasm of unspecified site of unspecified female breast: Secondary | ICD-10-CM | POA: Insufficient documentation

## 2012-09-28 DIAGNOSIS — R079 Chest pain, unspecified: Secondary | ICD-10-CM

## 2012-09-28 DIAGNOSIS — E785 Hyperlipidemia, unspecified: Secondary | ICD-10-CM | POA: Insufficient documentation

## 2012-09-28 DIAGNOSIS — I83009 Varicose veins of unspecified lower extremity with ulcer of unspecified site: Secondary | ICD-10-CM

## 2012-09-28 DIAGNOSIS — G5790 Unspecified mononeuropathy of unspecified lower limb: Secondary | ICD-10-CM | POA: Insufficient documentation

## 2012-09-28 DIAGNOSIS — L97909 Non-pressure chronic ulcer of unspecified part of unspecified lower leg with unspecified severity: Secondary | ICD-10-CM

## 2012-09-28 DIAGNOSIS — C499 Malignant neoplasm of connective and soft tissue, unspecified: Secondary | ICD-10-CM

## 2012-09-28 DIAGNOSIS — Z79899 Other long term (current) drug therapy: Secondary | ICD-10-CM | POA: Insufficient documentation

## 2012-09-28 DIAGNOSIS — R0789 Other chest pain: Principal | ICD-10-CM | POA: Insufficient documentation

## 2012-09-28 LAB — COMPREHENSIVE METABOLIC PANEL (CC13)
ALT: 14 U/L (ref 0–55)
AST: 22 U/L (ref 5–34)
Albumin: 3.8 g/dL (ref 3.5–5.0)
Calcium: 9.6 mg/dL (ref 8.4–10.4)
Chloride: 103 mEq/L (ref 98–107)
Potassium: 4.6 mEq/L (ref 3.5–5.1)
Sodium: 140 mEq/L (ref 136–145)
Total Protein: 7 g/dL (ref 6.4–8.3)

## 2012-09-28 LAB — CBC WITH DIFFERENTIAL/PLATELET
BASO%: 0.6 % (ref 0.0–2.0)
EOS%: 1.6 % (ref 0.0–7.0)
MCH: 31.9 pg (ref 25.1–34.0)
MCHC: 33.1 g/dL (ref 31.5–36.0)
MONO#: 0.6 10*3/uL (ref 0.1–0.9)
RDW: 14.3 % (ref 11.2–14.5)
WBC: 7.3 10*3/uL (ref 3.9–10.3)
lymph#: 1.4 10*3/uL (ref 0.9–3.3)

## 2012-09-28 LAB — BASIC METABOLIC PANEL
BUN: 19 mg/dL (ref 6–23)
CO2: 28 mEq/L (ref 19–32)
Calcium: 9.6 mg/dL (ref 8.4–10.5)
Glucose, Bld: 82 mg/dL (ref 70–99)
Sodium: 139 mEq/L (ref 135–145)

## 2012-09-28 LAB — CBC
HCT: 37.9 % (ref 36.0–46.0)
MCHC: 32.5 g/dL (ref 30.0–36.0)
Platelets: 213 10*3/uL (ref 150–400)
RDW: 14.6 % (ref 11.5–15.5)

## 2012-09-28 LAB — TROPONIN I: Troponin I: <0.3 ng/mL (ref <0.30)

## 2012-09-28 MED ORDER — OXYCODONE-ACETAMINOPHEN 7.5-325 MG PO TABS: 1.0000 | ORAL_TABLET | Freq: Four times a day (QID) | ORAL | Status: DC | PRN

## 2012-09-28 MED ORDER — SERTRALINE HCL 50 MG PO TABS: 50.0000 mg | ORAL_TABLET | Freq: Every evening | ORAL | Status: DC

## 2012-09-28 MED ORDER — PANTOPRAZOLE SODIUM 40 MG PO TBEC
40.0000 mg | DELAYED_RELEASE_TABLET | Freq: Every day | ORAL | Status: DC
  Administered 2012-09-29 – 2012-09-30 (×2): 40 mg via ORAL
  Filled 2012-09-28 (×2): qty 1

## 2012-09-28 MED ORDER — SODIUM CHLORIDE 0.9 % IJ SOLN
3.0000 mL | Freq: Two times a day (BID) | INTRAMUSCULAR | Status: DC
  Administered 2012-09-28 – 2012-09-29 (×3): 3 mL via INTRAVENOUS

## 2012-09-28 MED ORDER — ASPIRIN 325 MG PO TABS
325.0000 mg | ORAL_TABLET | Freq: Every evening | ORAL | Status: DC
  Administered 2012-09-29: 325 mg via ORAL
  Filled 2012-09-28 (×2): qty 1

## 2012-09-28 MED ORDER — AMIODARONE HCL 200 MG PO TABS
200.0000 mg | ORAL_TABLET | Freq: Every morning | ORAL | Status: DC
  Administered 2012-09-29 – 2012-09-30 (×2): 200 mg via ORAL
  Filled 2012-09-28 (×2): qty 1

## 2012-09-28 MED ORDER — ONDANSETRON HCL 4 MG PO TABS
4.0000 mg | ORAL_TABLET | Freq: Three times a day (TID) | ORAL | Status: DC | PRN
  Administered 2012-09-29 – 2012-09-30 (×2): 4 mg via ORAL
  Filled 2012-09-28 (×2): qty 1

## 2012-09-28 MED ORDER — OXYCODONE-ACETAMINOPHEN 5-325 MG PO TABS: 1.0000 | ORAL_TABLET | Freq: Four times a day (QID) | ORAL | Status: DC | PRN

## 2012-09-28 MED ORDER — OXYCODONE HCL 5 MG PO TABS: 2.5000 mg | ORAL_TABLET | Freq: Four times a day (QID) | ORAL | Status: DC | PRN

## 2012-09-28 MED ORDER — OXYCODONE HCL ER 10 MG PO T12A
10.0000 mg | EXTENDED_RELEASE_TABLET | Freq: Two times a day (BID) | ORAL | Status: DC | PRN
  Administered 2012-09-28 – 2012-09-30 (×3): 10 mg via ORAL
  Filled 2012-09-28 (×3): qty 1

## 2012-09-28 MED ORDER — SERTRALINE HCL 50 MG PO TABS
50.0000 mg | ORAL_TABLET | Freq: Every evening | ORAL | Status: DC
  Administered 2012-09-28 – 2012-09-29 (×2): 50 mg via ORAL
  Filled 2012-09-28 (×3): qty 1

## 2012-09-28 MED ORDER — ONDANSETRON HCL 4 MG/2ML IJ SOLN: 4.0000 mg | Freq: Three times a day (TID) | INTRAMUSCULAR | Status: DC | PRN

## 2012-09-28 NOTE — ED Notes (Signed)
Pt presenting to ed with c/o chest pain while at the cancer center pt states she finished her treatment today at the cancer center but developed chest pain with dizziness and pain radiating into her back and jaw

## 2012-09-28 NOTE — ED Notes (Signed)
Patient transported to X-ray 

## 2012-09-28 NOTE — Progress Notes (Signed)
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 Pathologic stage pT2 cN0 M0. She had another recurrence outside of the previous field of radiation; and had resection on February 16, 2010 with same pathology but positive margin. She is also s/p adjuvant XRT for each time she had resection. She was started on 06/26/10 with adjuvant chemotherapy with continuous infusion of Adriamycin/Cytoxan over 7 days q4 wks. There was plan for at least 5 cycles. However, she developed recurrent mucositis, worsening performance status, and unrelated problem of severe back pain from degenerative joint disease. Therefore, adjuvant chemo was discontinued after two cycles. She had been on watchful observation until she developed local recurrent disease in August 2012. She underwent repeat resection on December 27, 2010. She was deemed not be be candidate for repeat radiation. She developed recurrence in the right axilla and underwent resection on 08/14/2012.  CURRENT THERAPY: Radiation to the right axilla start on 09/23/2012  INTERVAL HISTORY: Veronica Ellison 79 y.o. female returns for regular follow up with her daughter.  She reports feeling well.  She lives with her daughter.  She is independent of activities of daily living.  She has chronic left chest wall pain from recurrent sarcoma.  She underwent resection of her right axilla sarcoma. She has healed well.  She denied cough, CP, hemoptysis.  The rest of the 14-point review of system was negative.    Past Medical History  Diagnosis Date  . Breast cancer 1996    (Lt) breast ca dx 1996  . 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  Hyperlipidemia   . Arthritis     osteoarthritis  . IBD (inflammatory bowel disease)   . Gallstones   . Fracture of left hand   . Stroke     "mild stroke in 2012"  . Vertigo     hx of  . Pneumonia     hx of  . Anemia     hx of  . Blood transfusion     "in 1999"  . Urinary tract infection     hx of  . GERD (gastroesophageal reflux disease)   . Headache(784.0)     "migraines hx of"  . Degenerative disc disease     hx of  . Hx of radiation therapy 07/2009 -08/2009, 04/2010- 05/2010    right chest wall  . History of radiation therapy 09/23/11-11/04/11    right paraspinal chest wall 560Gy   . A-fib   . Chronic back pain   . Wears glasses   . HOH (hard of hearing)   . Peripheral vascular disease     neuropathy in feet per patient    Past Surgical History  Procedure Laterality Date  . Mastectomy  1996  . Abdominal hysterectomy  1999  . Bowel resection  1999  . Cholecystectomy  2005  . 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 Sarcoma excision  12/27/10    rt axillary and back  . Eye surgery      bilateral cataract surgery  . Kyphosis surgery       x 3  . Mass excision  08/12/2011    sarcoma-Procedure: EXCISION MASS;  Surgeon: Liz Malady, MD;  Location: [facility] OR;  Service: General;  Laterality: Right;  excision mass right paraspinous area, excision mass right chest wall  . Mass excision Right 08/14/2012    Procedure: EXCISION MASS RIGHT AXIULA and right back;  Surgeon: Liz Malady, MD;  Location: [facility];  Service: General;  Laterality: Right;    Current Outpatient Prescriptions  Medication Sig Dispense Refill  . alendronate (FOSAMAX) 70 MG tablet Take 70 mg by mouth every 7 (seven) days. Take with a full glass of water on an empty stomach.      Marland Kitchen amiodarone (PACERONE) 200 MG tablet Take 200 mg by mouth daily.       Marland Kitchen aspirin 325 MG tablet Take 325 mg by mouth every evening.       . Calcium  Carbonate-Vitamin D (CALTRATE 600+D PO) Take 1 tablet by mouth 2 (two) times daily.       Marland Kitchen docusate sodium (COLACE) 100 MG capsule Take 100 mg by mouth 2 (two) times daily.       . Multiple Vitamins-Iron (MULTIVITAMINS WITH IRON) TABS Take 1 tablet by mouth daily.      . nitroGLYCERIN (NITROSTAT) 0.4 MG SL tablet Place 0.4 mg under the tongue every 5 (five) minutes as needed. For Chest pain      . omeprazole (PRILOSEC) 20 MG capsule Take 20 mg by mouth daily.       . ondansetron (ZOFRAN) 4 MG tablet Take 1 tablet (4 mg total) by mouth every 8 (eight) hours as needed for nausea.  20 tablet  1  . oxyCODONE-acetaminophen (PERCOCET) 7.5-325 MG per tablet Take 1 tablet by mouth every 6 (six) hours as needed for pain.  40 tablet  0  . OXYCONTIN 10 MG T12A 10 mg every 12 (twelve) hours.       . prochlorperazine (COMPAZINE) 10 MG tablet Take 10 mg by mouth every 6 (six) hours as needed. For nausea      . sertraline (ZOLOFT) 50 MG tablet Take 50 mg by mouth daily.       Marland Kitchen triamcinolone cream (KENALOG) 0.1 % Apply 1 application topically as needed. To itchy skin      . Wound Cleansers (RADIAPLEX EX) Apply 1 application topically as needed.        No current facility-administered medications for this visit.    ALLERGIES:  is allergic to amoxicillin; chocolate; peanut-containing drug products; adhesive; ampicillin; meperidine and related; morphine and related; nitrofurantoin; sulfa antibiotics; and tetracyclines & related.  REVIEW OF SYSTEMS:  The rest of the 14-point review of system was negative.   Filed Vitals:   09/28/12 1529  BP: 134/88  Pulse: 74  Temp: 96.3 F (35.7 C)  Resp: 18   Wt Readings from Last 3 Encounters:  09/28/12 105 lb 4.8 oz (47.764 kg)  09/28/12 104 lb 14.4 oz (47.582 kg)  09/10/12 106 lb 3.2 oz (48.172 kg)   ECOG Performance status: 1  PHYSICAL EXAMINATION:   General:  Thin-appearing woman in no acute distress.  Eyes:  no scleral icterus.  ENT:  There were no  oropharyngeal lesions.  Neck was without thyromegaly.  Lymphatics:  Negative cervical, supraclavicular or axillary adenopathy.  Respiratory: lungs were clear bilaterally without wheezing or crackles.  Cardiovascular:  Regular rate and rhythm, S1/S2, without murmur, rub or gallop.  There was no pedal edema.  GI:  abdomen was soft, flat, nontender, nondistended, without organomegaly.  Muscoloskeletal:  no spinal tenderness of palpation of vertebral spine. There was scar in the posterior left chest wall, hyperkaratotic; no palpable mass or nodlue.  Skin exam was without echymosis, petichae.  Neuro exam was nonfocal.  Patient was able to get on and off exam table without assistance.  Gait was normal.  Patient was alerted and oriented.  Attention was good.   Language was appropriate.  Mood was normal without depression.  Speech was not pressured.  Thought content was not tangential.     LABORATORY/RADIOLOGY DATA:  Lab Results  Component Value Date   WBC 7.3 09/28/2012   HGB 12.4 09/28/2012   HCT 37.3 09/28/2012   PLT 203 09/28/2012   GLUCOSE 92 08/12/2012   CHOL  Value: 232        ATP III CLASSIFICATION:  <200     mg/dL   Desirable  960-454  mg/dL   Borderline High  >=098    mg/dL   High       * 11/91/4782   TRIG 105 04/05/2010   HDL 74 04/05/2010   LDLCALC  Value: 137        Total Cholesterol/HDL:CHD Risk Coronary Heart Disease Risk Table                     Men   Women  1/2 Average Risk   3.4   3.3  Average Risk       5.0   4.4  2 X Average Risk   9.6   7.1  3 X Average Risk  23.4   11.0        Use the calculated Patient Ratio above and the CHD Risk Table to determine the patient's CHD Risk.        ATP III CLASSIFICATION (LDL):  <100     mg/dL   Optimal  956-213  mg/dL   Near or Above                    Optimal  130-159  mg/dL   Borderline  086-578  mg/dL   High  >469     mg/dL   Very High* 62/95/2841   ALKPHOS 83 03/27/2012   ALT 15 03/27/2012   AST 20 03/27/2012   NA 136 08/12/2012   K 4.6 08/12/2012   CL  102 08/12/2012   CREATININE 0.76 08/12/2012   BUN 20 08/12/2012   CO2 26 08/12/2012   INR 0.93 04/11/2011    ASSESSMENT AND PLAN:   1.  History of recurrent sarcoma.  She had a local recurrence to the right axilla in May 2014. Status post excision. Currently undergoing radiation therapy to the right axilla. She does not have symptoms suggesting of metastatic disease.   2.   Osteoarthritis, degenerative joint disease, and sacral compression fracture. She is on pain med prn.   3.  Atrial fibrillation. She is on amiodarone. She is off of Coumadin now due to risk of bleeding.   5.  History of osteoporosis. She is on calcium/Vit D/alendronate per PCP.   6.  Nonhealing ulcer right ankle. Resolved   7.  Follow up: in about 4 months.     Addendum: The lab notified me that Veronica Ellison was complaining of chest pain. Pain located in the mid epigastric region and radiating around to her back. The pain is a constant pressure and giving progressively worse. EKG obtained in my office shows normal sinus rhythm with sinus arrhythmia, left axis deviation,  incomplete right bundle branch block, anterior lateral infarct, age undetermined. Ventricular rate 63. Given the fact that the patient is having worsening of her pain, we have recommended that she go to the emergency room to be evaluated for her chest pain. The patient is in agreement with this plan and she was escorted by our staff to the [facility] along emergency department. Report called to the triage nurse.   The length of time of the face-to-face encounter was 30 minutes. More than 50% of time was spent counseling and coordination of care.

## 2012-09-28 NOTE — Progress Notes (Signed)
JESSIKAH, DICKER (MRN [id number]) as of 09/28/2012 23:56  Ref. Range 09/28/2012 23:20  D-Dimer, Quant Latest Range: 0.00-0.48 ug/mL-FEU 0.65 (H)

## 2012-09-28 NOTE — ED Notes (Signed)
Attempted IV access and was unsuccessful.  [place], charge rn notified and will attempt.

## 2012-09-28 NOTE — ED Notes (Signed)
Called to give report, staff sts room not clean and nurse isn't ready, pt was "just" discharged from room.

## 2012-09-28 NOTE — H&P (Signed)
PCP:   [name], MD   Chief Complaint:  cp  HPI: 79 yo female with  No prior h/o cardiac disease comes in with less than 30 minutes of sscp radiated to rt neck with some nausea no vomiting, along with epigastric pain that resolved on its own.  Had stress testing in past 5 years which was normal per pt report.  Denies any fevers, cough, le edema or swelling.  No vomiting.  No sob.  Feels back to normal now, cp free since arrival to ED.  No cp with deep inspiration.  Review of Systems:  Positive and negative as per HPI otherwise all other systems are negative  Past Medical History: Past Medical History  Diagnosis Date  . Breast cancer 1996    (Lt) breast ca dx 1996  . 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 Hyperlipidemia   . Arthritis     osteoarthritis  . IBD (inflammatory bowel disease)   . Gallstones   . Fracture of left hand   . Stroke     "mild stroke in 2012"  . Vertigo     hx of  . Pneumonia     hx of  . Anemia     hx of  . Blood transfusion     "in 1999"  . Urinary tract infection     hx of  . GERD (gastroesophageal reflux disease)   . Headache(784.0)     "migraines hx of"  . Degenerative disc disease     hx of  . Hx of radiation therapy 07/2009 -08/2009, 04/2010- 05/2010    right chest wall  . History of radiation therapy 09/23/11-11/04/11    right paraspinal chest wall 560Gy   . A-fib   . Chronic back pain   . Wears glasses   . HOH (hard of hearing)   . Peripheral vascular disease     neuropathy in feet per patient   Past Surgical History  Procedure Laterality Date  . Mastectomy  1996  . Abdominal hysterectomy  1999  . Bowel resection  1999  . Cholecystectomy  2005  . 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 Sarcoma excision  12/27/10    rt axillary and back  . Eye surgery      bilateral cataract surgery  . Kyphosis surgery       x 3  . Mass excision  08/12/2011     sarcoma-Procedure: EXCISION MASS;  Surgeon: Liz Malady, MD;  Location: [facility] OR;  Service: General;  Laterality: Right;  excision mass right paraspinous area, excision mass right chest wall  . Mass excision Right 08/14/2012    Procedure: EXCISION MASS RIGHT AXIULA and right back;  Surgeon: Liz Malady, MD;  Location: [facility];  Service: General;  Laterality: Right;    Medications: Prior to Admission medications   Medication Sig Start Date End Date Taking? Authorizing Provider  alendronate (FOSAMAX) 70 MG tablet Take 70 mg by mouth every 7 (seven) days. Take with a full glass of water on an empty stomach.   Yes Historical Provider, MD  amiodarone (PACERONE) 200 MG tablet Take 200 mg by mouth every morning.    Yes Historical Provider, MD  aspirin 325 MG tablet Take 325 mg by mouth every evening.    Yes Historical Provider, MD  Calcium Carbonate-Vitamin D (CALTRATE 600+D PO) Take 1 tablet by mouth 2 (  two) times daily.    Yes Historical Provider, MD  docusate sodium (COLACE) 100 MG capsule Take 100 mg by mouth every evening.    Yes Historical Provider, MD  Multiple Vitamins-Iron (MULTIVITAMINS WITH IRON) TABS Take 1 tablet by mouth every morning.    Yes Historical Provider, MD  omeprazole (PRILOSEC) 20 MG capsule Take 20 mg by mouth every morning.    Yes Historical Provider, MD  ondansetron (ZOFRAN) 4 MG tablet Take 1 tablet (4 mg total) by mouth every 8 (eight) hours as needed for nausea. 08/14/12  Yes Liz Malady, MD  oxyCODONE-acetaminophen (PERCOCET) 7.5-325 MG per tablet Take 1 tablet by mouth every 6 (six) hours as needed for pain. 08/14/12  Yes Liz Malady, MD  OXYCONTIN 10 MG T12A 10 mg 2 (two) times daily.  06/18/12  Yes Historical Provider, MD  sertraline (ZOLOFT) 50 MG tablet Take 50 mg by mouth every evening.  06/08/12  Yes Historical Provider, MD  Wound Cleansers (RADIAPLEX EX) Apply 1 application topically as needed (radiation).    Yes Historical Provider, MD   nitroGLYCERIN (NITROSTAT) 0.4 MG SL tablet Place 0.4 mg under the tongue every 5 (five) minutes as needed. For Chest pain    Historical Provider, MD    Allergies:   Allergies  Allergen Reactions  . Other     Allergic to all nuts. Gets sores in mouth and fever blisters  . Amoxicillin Diarrhea  . Chocolate Other (See Comments)    fever blisters  . Peanut-Containing Drug Products Other (See Comments)    Fever blisters  . Adhesive (Tape) Other (See Comments)    Tears skin  . Ampicillin Other (See Comments)    unknown  . Meperidine And Related Itching  . Morphine And Related Other (See Comments)    Hallucinations  . Nitrofurantoin Other (See Comments)    Drug fever  . Sulfa Antibiotics Rash  . Tetracyclines & Related Rash    Causes bumps    Social History:  reports that she has never smoked. She has never used smokeless tobacco. She reports that she does not drink alcohol or use illicit drugs.  Family History: Family History  Problem Relation Age of Onset  . Heart disease Mother   . Diabetes Mother   . Heart disease Father   . Kidney disease Father   . Hypertension Father   . Heart disease Sister   . Cancer Brother     prostate  . Heart disease Brother   . Cancer Son     pancreatic    Physical Exam: Filed Vitals:   09/28/12 1632 09/28/12 2100 09/28/12 2210  BP: 182/71 176/64   Pulse: 62 59   Temp: 98.1 F (36.7 C) 98.3 F (36.8 C)   TempSrc: Oral Oral   Resp: 16 18   Height:   5\' 2"  (1.575 m)  SpO2: 97% 100%    General appearance: alert, cooperative and no distress Head: Normocephalic, without obvious abnormality, atraumatic Eyes: negative Nose: Nares normal. Septum midline. Mucosa normal. No drainage or sinus tenderness. Neck: no JVD and supple, symmetrical, trachea midline Lungs: clear to auscultation bilaterally Heart: regular rate and rhythm, S1, S2 normal, no murmur, click, rub or gallop Abdomen: soft, non-tender; bowel sounds normal; no masses,   no organomegaly Extremities: extremities normal, atraumatic, no cyanosis or edema Pulses: 2+ and symmetric Skin: Skin color, texture, turgor normal. No rashes or lesions Neurologic: Grossly normal    Labs on Admission:   Recent Labs  09/28/12  1517 09/28/12 1837  NA 140 139  K 4.6 4.7  CL 103 101  CO2 26 28  GLUCOSE 79 82  BUN 20.7 19  CREATININE 0.9 0.78  CALCIUM 9.6 9.6    Recent Labs  09/28/12 1517  AST 22  ALT 14  ALKPHOS 75  BILITOT 0.43  PROT 7.0  ALBUMIN 3.8    Recent Labs  09/28/12 1517 09/28/12 1655  WBC 7.3 6.6  NEUTROABS 5.2  --   HGB 12.4 12.3  HCT 37.3 37.9  MCV 96.2 97.2  PLT 203 213    Recent Labs  09/28/12 1837  TROPONINI <0.30   Radiological Exams on Admission: Dg Chest 2 View  09/28/2012   *RADIOLOGY REPORT*  Clinical Data: Chest pain  CHEST - 2 VIEW  Comparison: 10/25/2011  Findings: Mild cardiac enlargement.  Negative for heart failure. Lungs are free of infiltrate or effusion.  There is scoliosis.  Cement vertebroplasty at three levels in the lumbar spine.  There are clips in the right breast and left axilla.  IMPRESSION: Cardiac enlargement.  No active cardiopulmonary abnormality.   Original Report Authenticated By: Janeece Riggers, M.D.    Assessment/Plan 79 yo female with atypical cp  Principal Problem:   Chest pain Active Problems:   Sarcoma   Breast cancer   Atrial fibrillation  Romi. Tele. Echo in am.  Full w/u with complete stress testing outpt.  Asa.    Jahmiyah Dullea A 09/28/2012, 10:25 PM

## 2012-09-28 NOTE — ED Provider Notes (Signed)
History    CSN: [id number] Arrival date & time 09/28/12  1625  First MD Initiated Contact with Patient 09/28/12 1720     Chief Complaint  Patient presents with  . Chest Pain   (Consider location/radiation/quality/duration/timing/severity/associated sxs/prior Treatment) HPI Comments: Pt comes in with cc of chest pain. Pt states that while at a clinic, she developed a mid sternal and diffuse chest pain, burning type. The pain moved to her back and to her neck. The pain lasted for at least 30 minutes, no specific aggravating or relieving factors noted by her. There was some nausea, no dib, diaphoresis, palpitations. 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 No recent stress.   Patient is a 79 y.o. female presenting with chest pain. The history is provided by the patient.  Chest Pain Associated symptoms: shortness of breath   Associated symptoms: no abdominal pain, no cough, no diaphoresis, no headache, no nausea, no palpitations and not vomiting    Past Medical History  Diagnosis Date  . Breast cancer 1996    (Lt) breast ca dx 1996  . 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 Hyperlipidemia   . Arthritis     osteoarthritis  . IBD (inflammatory bowel disease)   . Gallstones   . Fracture of left hand   . Stroke     "mild stroke in 2012"  . Vertigo     hx of  . Pneumonia     hx of  . Anemia     hx of  . Blood transfusion     "in 1999"  . Urinary tract infection     hx of  . GERD (gastroesophageal reflux disease)   . Headache(784.0)     "migraines hx of"  . Degenerative disc disease     hx of  . Hx of radiation therapy 07/2009 -08/2009, 04/2010- 05/2010    right chest wall  . History of radiation therapy 09/23/11-11/04/11    right paraspinal chest wall 560Gy   . A-fib   . Chronic back pain   . Wears glasses   . HOH (hard of hearing)   . Peripheral vascular disease     neuropathy in feet per patient   Past Surgical  History  Procedure Laterality Date  . Mastectomy  1996  . Abdominal hysterectomy  1999  . Bowel resection  1999  . Cholecystectomy  2005  . 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 Sarcoma excision  12/27/10    rt axillary and back  . Eye surgery      bilateral cataract surgery  . Kyphosis surgery       x 3  . Mass excision  08/12/2011    sarcoma-Procedure: EXCISION MASS;  Surgeon: Liz Malady, MD;  Location: [facility] OR;  Service: General;  Laterality: Right;  excision mass right paraspinous area, excision mass right chest wall  . Mass excision Right 08/14/2012    Procedure: EXCISION MASS RIGHT AXIULA and right back;  Surgeon: Liz Malady, MD;  Location: [facility];  Service: General;  Laterality: Right;   Family History  Problem Relation Age of Onset  . Heart disease Mother   . Diabetes Mother   . Heart disease Father   . Kidney disease Father   . Hypertension Father   . Heart disease Sister   . Cancer Brother  prostate  . Heart disease Brother   . Cancer Son     pancreatic   History  Substance Use Topics  . Smoking status: Never Smoker   . Smokeless tobacco: Never Used  . Alcohol Use: No   OB History   Grav Para Term Preterm Abortions TAB SAB Ect Mult Living                 Review of Systems  Constitutional: Positive for activity change. Negative for diaphoresis.  HENT: Negative for neck pain.   Respiratory: Positive for shortness of breath. Negative for cough and wheezing.   Cardiovascular: Positive for chest pain. Negative for palpitations and leg swelling.  Gastrointestinal: Negative for nausea, vomiting and abdominal pain.  Genitourinary: Negative for dysuria.  Neurological: Negative for light-headedness and headaches.  Hematological: Does not bruise/bleed easily.    Allergies  Amoxicillin; Chocolate; Peanut-containing drug products; Adhesive; Ampicillin; Meperidine and related; Morphine and  related; Nitrofurantoin; Sulfa antibiotics; and Tetracyclines & related  Home Medications   Current Outpatient Rx  Name  Route  Sig  Dispense  Refill  . alendronate (FOSAMAX) 70 MG tablet   Oral   Take 70 mg by mouth every 7 (seven) days. Take with a full glass of water on an empty stomach.         Marland Kitchen amiodarone (PACERONE) 200 MG tablet   Oral   Take 200 mg by mouth every morning.          Marland Kitchen aspirin 325 MG tablet   Oral   Take 325 mg by mouth every evening.          . Calcium Carbonate-Vitamin D (CALTRATE 600+D PO)   Oral   Take 1 tablet by mouth 2 (two) times daily.          Marland Kitchen docusate sodium (COLACE) 100 MG capsule   Oral   Take 100 mg by mouth every evening.          . Multiple Vitamins-Iron (MULTIVITAMINS WITH IRON) TABS   Oral   Take 1 tablet by mouth every morning.          Marland Kitchen omeprazole (PRILOSEC) 20 MG capsule   Oral   Take 20 mg by mouth every morning.          . ondansetron (ZOFRAN) 4 MG tablet   Oral   Take 1 tablet (4 mg total) by mouth every 8 (eight) hours as needed for nausea.   20 tablet   1   . oxyCODONE-acetaminophen (PERCOCET) 7.5-325 MG per tablet   Oral   Take 1 tablet by mouth every 6 (six) hours as needed for pain.   40 tablet   0   . OXYCONTIN 10 MG T12A      10 mg 2 (two) times daily.          . sertraline (ZOLOFT) 50 MG tablet   Oral   Take 50 mg by mouth every evening.          . Wound Cleansers (RADIAPLEX EX)   Apply externally   Apply 1 application topically as needed (radiation).          . nitroGLYCERIN (NITROSTAT) 0.4 MG SL tablet   Sublingual   Place 0.4 mg under the tongue every 5 (five) minutes as needed. For Chest pain          BP 182/71  Pulse 62  Temp(Src) 98.1 F (36.7 C) (Oral)  Resp 16  SpO2 97% Physical Exam  Nursing note and vitals reviewed. Constitutional: She is oriented to person, place, and time. She appears well-developed and well-nourished.  HENT:  Head: Normocephalic and  atraumatic.  Eyes: EOM are normal. Pupils are equal, round, and reactive to light.  Neck: Neck supple.  Cardiovascular: Normal rate, regular rhythm and normal heart sounds.   No murmur heard. Pulmonary/Chest: Effort normal. No respiratory distress.  Abdominal: Soft. She exhibits no distension. There is no tenderness. There is no rebound and no guarding.  Neurological: She is alert and oriented to person, place, and time.  Skin: Skin is warm and dry.    ED Course  Procedures (including critical care time) Labs Reviewed  CBC  TROPONIN I  BASIC METABOLIC PANEL   No results found. No diagnosis found.  MDM   Date: 09/28/2012  Rate: 62  Rhythm: normal sinus rhythm  QRS Axis: left  Intervals: normal  ST/T Wave abnormalities: nonspecific ST/T changes  Conduction Disutrbances:none  Narrative Interpretation:   Old EKG Reviewed: changes noted- t eave flattening or inversions in the inferior and anterior leads.  Differential diagnosis includes: ACS syndrome Valvular disorder Myocarditis Pericarditis Pericardial effusion Pulmonary edema PE Esophageal spasms Aortic dissection Musculoskeletal pain   Pt comes in with cc of chest pain. Cardiac risk factors include - age, HL - and she has hx of strokes. She had atypical pain. Story is slightly concerning due to the fact that it radiated to the back, and neck.  Could be esophageal spasms vs cardiac in etiology. Given her age, and the fact that she has no known GERD hx, and that she is an elderly woman, considering admission for ACS workup.  Derwood Kaplan, MD 09/28/12 [phone]

## 2012-09-28 NOTE — Telephone Encounter (Signed)
gv and printed app tsched for pt....sent pt back to the lab

## 2012-09-28 NOTE — Progress Notes (Signed)
Weekly Management Note:  Site: Right axilla Current Dose:  800  cGy Projected Dose: 5000  cGy followed by reduced field boost  Narrative: The patient is seen today for routine under treatment assessment. CBCT/MVCT images/port films were reviewed. The chart was reviewed.   She is without complaints today.  Physical Examination:  Filed Vitals:   09/28/12 1433  BP: 135/74  Pulse: 59  Temp: 98.6 F (37 C)  Resp: 20  .  Weight: 104 lb 14.4 oz (47.582 kg). There no significant skin changes.  Impression: Tolerating radiation therapy well.  Plan: Continue radiation therapy as planned.

## 2012-09-28 NOTE — Progress Notes (Signed)
Radiation tx right axilla, 4/28 completed  No skin changes noted, hasn't started radiaplex gel yet, just c/o soreness that area, does push her husband in w/c at his rehab facility  2:35 PM

## 2012-09-29 ENCOUNTER — Ambulatory Visit
Admission: RE | Admit: 2012-09-29 | Discharge: 2012-09-29 | Disposition: A | Discharge status: Home / Self Care | Payer: Medicare PPO | Source: Ambulatory Visit | Attending: Radiation Oncology | Admitting: Radiation Oncology

## 2012-09-29 LAB — TROPONIN I: Troponin I: <0.3 ng/mL (ref <0.30)

## 2012-09-29 NOTE — Progress Notes (Signed)
Echo Lab  2D Echocardiogram completed.  Zarie Kosiba L Aaisha Sliter, RDCS 09/29/2012 11:29 AM

## 2012-09-29 NOTE — Discharge Summary (Addendum)
Physician Discharge Summary  Veronica Ellison WUJ:[id number] DOB: March 01, 1922 DOA: 09/28/2012  PCP: [name], MD  Admit date: 09/28/2012 Discharge date: 09/29/2012  Time spent: minutes  Recommendations for Outpatient Follow-up:  1. Dr. Gaylyn Rong in 1 week  Discharge Diagnoses:  Principal Problem:   Atypical Chest pain Active Problems:   Sarcoma   Breast cancer   Atrial fibrillation   Discharge Condition: stable  Diet recommendation: regular  Filed Weights   09/29/12 0501  Weight: 46.539 kg (102 lb 9.6 oz)    History of present illness:  78 yo female with recurrent chest sarcoma, No prior h/o cardiac disease comes in with less than 30 minutes of sscp radiated to rt neck with some nausea no vomiting, along with epigastric pain that resolved on its own. Had stress testing in past 5 years which was normal per pt report. Denies any fevers, cough, le edema or swelling. No vomiting. No sob. Feels back to normal now, cp free since arrival to ED. No cp with deep inspiration.   Hospital Course:   She was symptom free by the time of admission, ruled out for MI based on cardiac enzymes x3, EKG benign, 2D echo with normal wall motion, no events on Tele.  No further cardiac workup needed at this time.  No tachycardia, hypoxia or dyspnea to suspect PE.  Medically stable and discharged home in a stable condition.  Discharge Exam: Filed Vitals:   09/28/12 2229 09/29/12 0501 09/29/12 1300 09/29/12 2125  BP: 132/45 127/72 123/70 138/69  Pulse:  55 61 68  Temp:  98 F (36.7 C) 98.5 F (36.9 C) 98.2 F (36.8 C)  TempSrc:  Oral Oral Oral  Resp:  16 15 18   Height:      Weight:  46.539 kg (102 lb 9.6 oz)    SpO2:  97% 98% 97%    General: AAOx3 Cardiovascular:S1S2/RRR Respiratory: CTAB  Discharge Instructions  Discharge Orders   Future Appointments Provider Department Dept Phone   09/30/2012 2:00 PM [facility] [phone]    10/01/2012 2:00 PM [facility] [phone]   10/02/2012 2:00 PM [facility] [phone]   10/05/2012 2:00 PM [facility] [phone]   10/06/2012 2:00 PM [facility] [phone]   10/07/2012 2:00 PM [facility] [phone]   10/08/2012 2:00 PM [facility] [phone]   10/12/2012 2:00 PM [facility] [phone]   10/13/2012 2:00 PM [facility] [phone]   10/14/2012 11:10 AM Liz Malady, MD [facility] [place] [phone]   10/14/2012 2:00 PM [facility] [phone]   10/15/2012 2:00 PM [facility] [phone]   10/16/2012 2:00 PM [facility] [phone]   10/19/2012 2:00 PM [facility] [phone]   10/20/2012 2:00 PM [facility] [phone]   10/21/2012 2:00 PM [facility] [phone]   10/22/2012 2:00 PM [facility] [phone]   10/23/2012 2:00 PM [facility] [phone]   10/26/2012 2:00 PM [facility] [place] [facility] [phone]   10/27/2012 2:00 PM [facility] [facility] [phone]   10/28/2012 2:00 PM [facility] [facility]  [phone]   10/29/2012 2:00 PM [facility] [facility] [phone]   10/30/2012 2:00 PM [facility] [phone]   11/02/2012 2:00 PM [facility] [place] [facility] [phone]   01/28/2013 12:30 PM Del[facility] R[facility] [phone]   01/28/2013 1:00 PM [facility] [phone]   Future Orders Complete By Expires     Diet - low sodium heart healthy  As directed     Increase activity slowly  As directed         Medication List    TAKE these medications       alendronate 70 MG tablet  Commonly known as:  FOSAMAX  Take 70 mg by mouth every 7 (seven) days. Take with a full glass of water on an empty stomach.     amiodarone 200 MG tablet  Commonly known as:  PACERONE  Take 200 mg by mouth every morning.     aspirin 325 MG tablet  Take 325 mg by mouth every evening.     CALTRATE 600+D PO  Take 1 tablet by mouth 2 (two) times daily.     docusate sodium 100 MG capsule  Commonly known as:  COLACE  Take 100 mg by mouth every evening.     multivitamins with iron Tabs  Take 1 tablet by mouth every morning.     NITROSTAT 0.4 MG SL tablet  Generic drug:  nitroGLYCERIN  Place 0.4 mg under the tongue every 5 (five) minutes as needed. For Chest pain     omeprazole 20 MG capsule  Commonly known as:  PRILOSEC  Take 20 mg by mouth every morning.     ondansetron 4 MG tablet  Commonly known as:  ZOFRAN  Take 1 tablet (4 mg total) by mouth every 8 (eight) hours as needed for nausea.     oxyCODONE-acetaminophen 7.5-325 MG per tablet  Commonly known as:  PERCOCET  Take 1 tablet by mouth every 6 (six) hours as needed for pain.     OXYCONTIN 10 mg T12a  Generic drug:  OxyCODONE  10 mg 2 (two) times daily.     RADIAPLEX EX  Apply 1 application topically as  needed (radiation).     sertraline 50 MG tablet  Commonly known as:  ZOLOFT  Take 50 mg by mouth every evening.       Allergies  Allergen Reactions  . Other     Allergic to all nuts. Gets sores in mouth and fever blisters  . Amoxicillin Diarrhea  . Chocolate Other (See Comments)    fever blisters  . Peanut-Containing Drug Products Other (See Comments)    Fever blisters  . Adhesive (Tape) Other (See Comments)    Tears skin  . Ampicillin Other (See Comments)    unknown  . Meperidine And Related Itching  . Morphine And Related Other (See Comments)    Hallucinations  . Nitrofurantoin Other (See Comments)    Drug fever  . Sulfa Antibiotics Rash  . Tetracyclines & Related Rash    Causes bumps  The results of significant diagnostics from this hospitalization (including imaging, microbiology, ancillary and laboratory) are listed below for reference.    Significant Diagnostic Studies: Dg Chest 2 View  09/28/2012   *RADIOLOGY REPORT*  Clinical Data: Chest pain  CHEST - 2 VIEW  Comparison: 10/25/2011  Findings: Mild cardiac enlargement.  Negative for heart failure. Lungs are free of infiltrate or effusion.  There is scoliosis.  Cement vertebroplasty at three levels in the lumbar spine.  There are clips in the right breast and left axilla.  IMPRESSION: Cardiac enlargement.  No active cardiopulmonary abnormality.   Original Report Authenticated By: Janeece Riggers, M.D.    Microbiology: No results found for this or any previous visit (from the past 240 hour(s)).   Labs: Basic Metabolic Panel:  Recent Labs Lab 09/28/12 1517 09/28/12 1837  NA 140 139  K 4.6 4.7  CL 103 101  CO2 26 28  GLUCOSE 79 82  BUN 20.7 19  CREATININE 0.9 0.78  CALCIUM 9.6 9.6   Liver Function Tests:  Recent Labs Lab 09/28/12 1517  AST 22  ALT 14  ALKPHOS 75  BILITOT 0.43  PROT 7.0  ALBUMIN 3.8   No results found for this basename: LIPASE, AMYLASE,  in the last 168 hours No results found  for this basename: AMMONIA,  in the last 168 hours CBC:  Recent Labs Lab 09/28/12 1517 09/28/12 1655  WBC 7.3 6.6  NEUTROABS 5.2  --   HGB 12.4 12.3  HCT 37.3 37.9  MCV 96.2 97.2  PLT 203 213   Cardiac Enzymes:  Recent Labs Lab 09/28/12 1837 09/28/12 2320 09/29/12 0435  TROPONINI <0.30 <0.30 <0.30   BNP: BNP (last 3 results) No results found for this basename: PROBNP,  in the last 8760 hours CBG: No results found for this basename: GLUCAP,  in the last 168 hours     Signed:  Kingstin Heims  [facility] 09/29/2012, 9:50 PM

## 2012-09-29 NOTE — Progress Notes (Signed)
Veronica Ellison was admitted on yesterday for chest pain which radiated to her back and neck.  Currently she has not experience any chest pain today and would like to have her radiation therapy treatment.  Her nurse states that she is planned for discharge today after she has an Echocardiogram.  D.r Dayton Scrape notified and approved today's treatment.   Of note her d- dimer was elevated at 0.65 nad her last Troponin level was WNL at <0.30.

## 2012-09-30 ENCOUNTER — Ambulatory Visit
Admission: RE | Admit: 2012-09-30 | Discharge: 2012-09-30 | Disposition: A | Discharge status: Home / Self Care | Payer: Medicare PPO | Source: Ambulatory Visit | Attending: Radiation Oncology | Admitting: Radiation Oncology

## 2012-09-30 LAB — BASIC METABOLIC PANEL
CO2: 28 mEq/L (ref 19–32)
GFR calc non Af Amer: 63 mL/min — ABNORMAL LOW (ref >90)
Glucose, Bld: 108 mg/dL — ABNORMAL HIGH (ref 70–99)
Potassium: 3.9 mEq/L (ref 3.5–5.1)
Sodium: 140 mEq/L (ref 135–145)

## 2012-09-30 NOTE — Progress Notes (Signed)
Pt to have radiation appointment today, scheduled for 1300. Pt has been d/c'd from floor and will leave from [facility] after radiation treatment, per pt and family request.

## 2012-09-30 NOTE — Progress Notes (Signed)
No events overnight, patient denies chest pain or dyspnea. She had asymptomatic PVC on telemetry. Electrolytes within normal limits. Chest pain resolved. Patient stable to be discharge home today.  Hartley Barefoot, MD.

## 2012-09-30 NOTE — Care Management Note (Signed)
    Page 1 of 1   09/30/2012     10:28:41 AM   CARE MANAGEMENT NOTE 09/30/2012  Patient:  Veronica Ellison, Veronica Ellison   Account Number:  [id number]  Date Initiated:  09/30/2012  Documentation initiated by:  [facility]  Subjective/Objective Assessment:   ADMITTED W/CHEST PAIN.     Action/Plan:   FROM HOME.   Anticipated DC Date:  09/30/2012   Anticipated DC Plan:  HOME/SELF CARE      DC Planning Services  CM consult      Choice offered to / List presented to:             Status of service:  Completed, signed off Medicare Important Message given?   (If response is "NO", the following Medicare IM given date fields will be blank) Date Medicare IM given:   Date Additional Medicare IM given:    Discharge Disposition:  HOME/SELF CARE  Per UR Regulation:  Reviewed for med. necessity/level of care/duration of stay  If discussed at Long Length of Stay Meetings, dates discussed:    Comments:  09/30/12 Yael Coppess RN,BSN NCM [phone] D/C HOME NO ORDERS OR NEEDS.

## 2012-09-30 NOTE — Progress Notes (Signed)
Patient on observation since 6.23.14 for chest pain.  Patient is currently active with [facility] Care Management for chronic disease management services.  Patient has been engaged by a [employer].  Our community based plan of care has focused on disease management of HTN, Pain Management, and depression.  She does not currently take nutritional supplements, but her overall nutrition has been an ongoing concern.  We will reassess her status ongoing.  Patient is currently receiving radiation therapy for recurred breast carcinoma.  She is safe to return home and has a supportive daughter.  Historically, medication procurement/adherence and community resource support have not been a barrier.    Her husband is her primary focus.  He is medically fragile and will likely require 24 hour care at home or SNF in the near future.  Patient will receive a post discharge transition of care call and will be evaluated for monthly home visits for assessments and disease process education. Of note, [facility] Care Management services does not replace or interfere with any services that are arranged by inpatient case management or social work.  For additional questions or referrals please contact Anibal Henderson BSN RN [facility] Liaison at [phone].

## 2012-10-01 ENCOUNTER — Telehealth: Payer: Self-pay | Admitting: Oncology

## 2012-10-01 ENCOUNTER — Ambulatory Visit
Admission: RE | Admit: 2012-10-01 | Discharge: 2012-10-01 | Disposition: A | Discharge status: Home / Self Care | Payer: Medicare PPO | Source: Ambulatory Visit | Attending: Radiation Oncology | Admitting: Radiation Oncology

## 2012-10-02 ENCOUNTER — Ambulatory Visit
Admission: RE | Admit: 2012-10-02 | Discharge: 2012-10-02 | Disposition: A | Discharge status: Home / Self Care | Payer: Medicare PPO | Source: Ambulatory Visit | Attending: Radiation Oncology | Admitting: Radiation Oncology

## 2012-10-05 ENCOUNTER — Encounter: Payer: Self-pay | Admitting: Radiation Oncology

## 2012-10-05 ENCOUNTER — Ambulatory Visit
Admission: RE | Admit: 2012-10-05 | Discharge: 2012-10-05 | Disposition: A | Discharge status: Home / Self Care | Payer: Medicare PPO | Source: Ambulatory Visit | Attending: Radiation Oncology | Admitting: Radiation Oncology

## 2012-10-05 ENCOUNTER — Ambulatory Visit: Payer: Medicare PPO | Admitting: Lab

## 2012-10-05 VITALS — BP 149/76 | HR 53 | Temp 98.4°F | Resp 20 | Wt 104.3 lb

## 2012-10-05 DIAGNOSIS — C493 Malignant neoplasm of connective and soft tissue of thorax: Secondary | ICD-10-CM

## 2012-10-05 NOTE — Progress Notes (Signed)
   Weekly Management Note:  outpatient Current Dose:  18 Gy  Projected Dose: 50 Gy   Narrative:  The patient presents for routine under treatment assessment.  CBCT/MVCT images/Port film x-rays were reviewed.  The chart was checked. No new complaints. Discharged after being an inpt under observation for chest pain.   Physical Findings:  weight is 104 lb 4.8 oz (47.31 kg). Her oral temperature is 98.4 F (36.9 C). Her blood pressure is 149/76 and her pulse is 53. Her respiration is 20.  No skin irritation in right axilla fields.   Impression:  The patient is tolerating radiotherapy.  Plan:  Continue radiotherapy as planned.  ________________________________   Lonie Peak, M.D.

## 2012-10-05 NOTE — Progress Notes (Signed)
Pt denies pain, is fatigued, loss of appetite. She is applying Radiaplex to right axilla.

## 2012-10-06 ENCOUNTER — Ambulatory Visit
Admission: RE | Admit: 2012-10-06 | Discharge: 2012-10-06 | Disposition: A | Discharge status: Home / Self Care | Payer: Medicare PPO | Source: Ambulatory Visit | Attending: Radiation Oncology | Admitting: Radiation Oncology

## 2012-10-06 ENCOUNTER — Telehealth: Payer: Self-pay | Admitting: *Deleted

## 2012-10-06 NOTE — Telephone Encounter (Signed)
Called pt to inform her of lab/office visit not necessary as scheduled for tomorrow.  She needs office f/u in 4 months and we will cancel her appts for tomorrow.   She verbalized understanding.

## 2012-10-07 ENCOUNTER — Other Ambulatory Visit: Payer: Medicare PPO | Admitting: Lab

## 2012-10-07 ENCOUNTER — Ambulatory Visit
Admission: RE | Admit: 2012-10-07 | Discharge: 2012-10-07 | Disposition: A | Discharge status: Home / Self Care | Payer: Medicare PPO | Source: Ambulatory Visit | Attending: Radiation Oncology | Admitting: Radiation Oncology

## 2012-10-07 ENCOUNTER — Ambulatory Visit: Payer: Medicare PPO | Admitting: Oncology

## 2012-10-08 ENCOUNTER — Ambulatory Visit
Admission: RE | Admit: 2012-10-08 | Discharge: 2012-10-08 | Disposition: A | Discharge status: Home / Self Care | Payer: Medicare PPO | Source: Ambulatory Visit | Attending: Radiation Oncology | Admitting: Radiation Oncology

## 2012-10-12 ENCOUNTER — Encounter: Payer: Self-pay | Admitting: Radiation Oncology

## 2012-10-12 ENCOUNTER — Ambulatory Visit
Admission: RE | Admit: 2012-10-12 | Discharge: 2012-10-12 | Disposition: A | Discharge status: Home / Self Care | Payer: Medicare PPO | Source: Ambulatory Visit | Attending: Radiation Oncology | Admitting: Radiation Oncology

## 2012-10-12 VITALS — BP 129/69 | HR 69 | Resp 18 | Wt 103.8 lb

## 2012-10-12 DIAGNOSIS — C493 Malignant neoplasm of connective and soft tissue of thorax: Secondary | ICD-10-CM

## 2012-10-12 NOTE — Progress Notes (Signed)
Weekly Management Note:  Site: Right axilla Current Dose:  2600  cGy Projected Dose: 5000  cGy followed by boost  Narrative: The patient is seen today for routine under treatment assessment. CBCT/MVCT images/port films were reviewed. The chart was reviewed.   She has been having frontal "sinus headaches" along with nausea. She that her nausea secondary to her pain medication. She is being evaluated by her primary care physician. She is otherwise doing well.  Physical Examination:  Filed Vitals:   10/12/12 1431  BP: 129/69  Pulse: 69  Resp: 18  .  Weight: 103 lb 12.8 oz (47.083 kg). No change in her axillary mass. No significant skin changes.  Impression: Tolerating radiation therapy well. I doubt that she has metastatic disease to her brain in the absence of pulmonary metastases.  Plan: Continue radiation therapy as planned.

## 2012-10-12 NOTE — Progress Notes (Signed)
Reports that she felt nauseated this morning therefore she took an antiemetic. She reports that the nausea has resolved. Patient reports that she has a headache indicating its in the frontal region. No skin changes of right axilla noted. Reports using radiaplex as directed.

## 2012-10-13 ENCOUNTER — Ambulatory Visit
Admission: RE | Admit: 2012-10-13 | Discharge: 2012-10-13 | Disposition: A | Discharge status: Home / Self Care | Payer: Medicare PPO | Source: Ambulatory Visit | Attending: Radiation Oncology | Admitting: Radiation Oncology

## 2012-10-14 ENCOUNTER — Encounter (INDEPENDENT_AMBULATORY_CARE_PROVIDER_SITE_OTHER): Payer: Self-pay | Admitting: General Surgery

## 2012-10-14 ENCOUNTER — Ambulatory Visit (INDEPENDENT_AMBULATORY_CARE_PROVIDER_SITE_OTHER): Payer: Medicare PPO | Admitting: General Surgery

## 2012-10-14 ENCOUNTER — Ambulatory Visit
Admission: RE | Admit: 2012-10-14 | Discharge: 2012-10-14 | Disposition: A | Discharge status: Home / Self Care | Payer: Medicare PPO | Source: Ambulatory Visit | Attending: Radiation Oncology | Admitting: Radiation Oncology

## 2012-10-14 VITALS — BP 134/78 | HR 68 | Temp 98.6°F | Resp 14 | Ht 62.0 in | Wt 104.6 lb

## 2012-10-14 DIAGNOSIS — C493 Malignant neoplasm of connective and soft tissue of thorax: Secondary | ICD-10-CM

## 2012-10-14 NOTE — Progress Notes (Signed)
Subjective:     Patient ID: Veronica Ellison, female   DOB: October 29, 1921, 79 y.o.   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 She is undergoing radiation therapy to her right axilla. She is having local discomfort there. Review of Systems     Objective:   Physical Exam  Constitutional: She appears well-developed. No distress.  Neck: Normal range of motion. Neck supple.  Cardiovascular: Normal heart sounds.   No murmur heard. Pulmonary/Chest: Effort normal and breath sounds normal.  Right axilla with central palpable mass which is mildly tender, also some fluid palpable below the skin more inferior to this. Right paraspinous back excision areas are flat without any nodularity, old excision sites along right chest wall are similar to last exam with scar tissue present.  Abdominal: Soft. She exhibits no distension. There is no tenderness.  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 She likely has residual tumor there which is palpable. Hopefully this will respond some to the therapy. She does not seem interested in further surgery to resect this area though we did discuss that. I will see her back after she completes radiation therapy. I spoke with her and her daughter. We also discussed the ongoing issues with the patient's husband and his progressive dementia and placement problems.

## 2012-10-15 ENCOUNTER — Ambulatory Visit
Admission: RE | Admit: 2012-10-15 | Discharge: 2012-10-15 | Disposition: A | Discharge status: Home / Self Care | Payer: Medicare PPO | Source: Ambulatory Visit | Attending: Radiation Oncology | Admitting: Radiation Oncology

## 2012-10-16 ENCOUNTER — Ambulatory Visit
Admission: RE | Admit: 2012-10-16 | Discharge: 2012-10-16 | Disposition: A | Discharge status: Home / Self Care | Payer: Medicare PPO | Source: Ambulatory Visit | Attending: Radiation Oncology | Admitting: Radiation Oncology

## 2012-10-19 ENCOUNTER — Ambulatory Visit
Admission: RE | Admit: 2012-10-19 | Discharge: 2012-10-19 | Disposition: A | Discharge status: Home / Self Care | Payer: Medicare PPO | Source: Ambulatory Visit | Attending: Radiation Oncology | Admitting: Radiation Oncology

## 2012-10-19 ENCOUNTER — Encounter: Payer: Self-pay | Admitting: Radiation Oncology

## 2012-10-19 VITALS — BP 156/69 | HR 69 | Temp 98.4°F | Resp 20 | Wt 105.0 lb

## 2012-10-19 DIAGNOSIS — C499 Malignant neoplasm of connective and soft tissue, unspecified: Secondary | ICD-10-CM

## 2012-10-19 NOTE — Progress Notes (Signed)
Weekly Management Note:  Site: Right axilla Current Dose:  3600  cGy Projected Dose: 5000   cGy  Narrative: The patient is seen today for routine under treatment assessment. CBCT/MVCT images/port films were reviewed. The chart was reviewed.   She does report some "soreness" along her axilla. She uses Radioplex gel twice a day. She was seen by [name] last week.  Physical Examination:  Filed Vitals:   10/19/12 1430  BP: 156/69  Pulse: 69  Temp: 98.4 F (36.9 C)  Resp: 20  .  Weight: 105 lb (47.628 kg). I believe has been slight regression of her axillary mass. There is erythema with no areas of desquamation. On inspection of her back there is no evidence for recurrent disease along the right paraspinal region  Impression: Tolerating radiation therapy well.  Plan: Continue radiation therapy as planned.

## 2012-10-19 NOTE — Progress Notes (Addendum)
Weekly rd txs right axilla 18/28, no skin changes noted, no c/o pain, using radiaplex gel bid, eating well, drinking plenty fluids

## 2012-10-20 ENCOUNTER — Ambulatory Visit
Admission: RE | Admit: 2012-10-20 | Discharge: 2012-10-20 | Disposition: A | Discharge status: Home / Self Care | Payer: Medicare PPO | Source: Ambulatory Visit | Attending: Radiation Oncology | Admitting: Radiation Oncology

## 2012-10-21 ENCOUNTER — Ambulatory Visit
Admission: RE | Admit: 2012-10-21 | Discharge: 2012-10-21 | Disposition: A | Discharge status: Home / Self Care | Payer: Medicare PPO | Source: Ambulatory Visit | Attending: Radiation Oncology | Admitting: Radiation Oncology

## 2012-10-21 ENCOUNTER — Encounter: Payer: Self-pay | Admitting: Radiation Oncology

## 2012-10-21 NOTE — Progress Notes (Signed)
Complex simulation note: With virtual software Ms. Kinlaw was set up to a reduced right axillary field. She was set up to AP and PA. We were unable to cover the most inferior aspect of her gross tumor because of previous radiation therapy to her right chest wall. I'm prescribing 600 cGy 3 sessions utilizing 6 MV photons. Once the multileaf collimators is designed to conform the field.

## 2012-10-22 ENCOUNTER — Encounter: Payer: Self-pay | Admitting: Radiation Oncology

## 2012-10-22 ENCOUNTER — Ambulatory Visit
Admission: RE | Admit: 2012-10-22 | Discharge: 2012-10-22 | Disposition: A | Discharge status: Home / Self Care | Payer: Medicare PPO | Source: Ambulatory Visit | Attending: Radiation Oncology | Admitting: Radiation Oncology

## 2012-10-22 NOTE — Progress Notes (Signed)
Chart note: The patient completed treatment planning today. She is treated AP and PA with wedges and once the multileaf collimators for a total of 2 complex treatment devices.

## 2012-10-23 ENCOUNTER — Ambulatory Visit
Admission: RE | Admit: 2012-10-23 | Discharge: 2012-10-23 | Disposition: A | Discharge status: Home / Self Care | Payer: Medicare PPO | Source: Ambulatory Visit | Attending: Radiation Oncology | Admitting: Radiation Oncology

## 2012-10-26 ENCOUNTER — Encounter: Payer: Self-pay | Admitting: Radiation Oncology

## 2012-10-26 ENCOUNTER — Ambulatory Visit
Admission: RE | Admit: 2012-10-26 | Discharge: 2012-10-26 | Disposition: A | Discharge status: Home / Self Care | Payer: Medicare PPO | Source: Ambulatory Visit | Attending: Radiation Oncology | Admitting: Radiation Oncology

## 2012-10-26 VITALS — BP 142/70 | HR 55 | Temp 97.5°F | Resp 20 | Wt 106.0 lb

## 2012-10-26 DIAGNOSIS — C493 Malignant neoplasm of connective and soft tissue of thorax: Secondary | ICD-10-CM

## 2012-10-26 NOTE — Progress Notes (Signed)
Weekly rad txs rt axilla, 23/28, txs comploeted, slight erythema,skin intact, no c/o pain, but has acid reflux at night when lying down "Feels bubbles going up to throat and hangs there" Using radiaplex daily

## 2012-10-26 NOTE — Progress Notes (Addendum)
   Weekly Management Note:  outpatient Current Dose:  46 Gy  Projected Dose: 50 Gy  initial  Narrative:  The patient presents for routine under treatment assessment.  CBCT/MVCT images/Port film x-rays were reviewed.  The chart was checked.  No new complaints related to RT.  Physical Findings:  weight is 106 lb (48.081 kg). Her oral temperature is 97.5 F (36.4 C). Her blood pressure is 142/70 and her pulse is 55. Her respiration is 20 and oxygen saturation is 99%.   Slight erythema of right axilla  Impression:  The patient is tolerating radiotherapy.  Plan:  Continue radiotherapy as planned. 5 wk f/u card given.  ________________________________   Lonie Peak, M.D.

## 2012-10-27 ENCOUNTER — Ambulatory Visit
Admission: RE | Admit: 2012-10-27 | Discharge: 2012-10-27 | Disposition: A | Discharge status: Home / Self Care | Payer: Medicare PPO | Source: Ambulatory Visit | Attending: Radiation Oncology | Admitting: Radiation Oncology

## 2012-10-28 ENCOUNTER — Ambulatory Visit
Admission: RE | Admit: 2012-10-28 | Discharge: 2012-10-28 | Disposition: A | Discharge status: Home / Self Care | Payer: Medicare PPO | Source: Ambulatory Visit | Attending: Radiation Oncology | Admitting: Radiation Oncology

## 2012-10-29 ENCOUNTER — Ambulatory Visit
Admission: RE | Admit: 2012-10-29 | Discharge: 2012-10-29 | Disposition: A | Discharge status: Home / Self Care | Payer: Medicare PPO | Source: Ambulatory Visit | Attending: Radiation Oncology | Admitting: Radiation Oncology

## 2012-10-30 ENCOUNTER — Ambulatory Visit
Admission: RE | Admit: 2012-10-30 | Discharge: 2012-10-30 | Disposition: A | Discharge status: Home / Self Care | Payer: Medicare PPO | Source: Ambulatory Visit | Attending: Radiation Oncology | Admitting: Radiation Oncology

## 2012-11-02 ENCOUNTER — Ambulatory Visit
Admission: RE | Admit: 2012-11-02 | Discharge: 2012-11-02 | Disposition: A | Discharge status: Home / Self Care | Payer: Medicare PPO | Source: Ambulatory Visit | Attending: Radiation Oncology | Admitting: Radiation Oncology

## 2012-11-02 ENCOUNTER — Encounter: Payer: Self-pay | Admitting: Radiation Oncology

## 2012-11-02 VITALS — BP 144/65 | HR 56 | Resp 16 | Wt 105.1 lb

## 2012-11-02 DIAGNOSIS — C493 Malignant neoplasm of connective and soft tissue of thorax: Secondary | ICD-10-CM

## 2012-11-02 NOTE — Progress Notes (Signed)
Patient reports fatigue. Hyperpigmentation of right axilla and posterior right upper back. Dry desquamation of anterior axilla noted. Encouraged patient to continue radiaplex bid for next two week plus neosporin to area of desquamation. Encouraged patient to call with future needs. Patient already has an appointment for one month follow up.

## 2012-11-02 NOTE — Progress Notes (Signed)
Simulation verification note: The patient underwent simulation verification on 10/28/2012 for her reduced right axillary field. Her isocenter was in good position and the multileaf collimators contoured the treatment volume appropriately.

## 2012-11-02 NOTE — Progress Notes (Signed)
[facility] Radiation Oncology End of Treatment Note  Name:Janaki A Gaumond  Date: 11/02/2012 ZOX:[id number] DOB:[birth year]   Status:outpatient    CC: [name], MD  Dr. Violeta Gelinas  REFERRING PHYSICIAN:   Dr. Violeta Gelinas   DIAGNOSIS: Recurrent soft tissue sarcoma, right axilla   INDICATION FOR TREATMENT: Palliative   TREATMENT DATES: 09/23/2012 through 11/02/2012                          SITE/DOSE:  Right axilla    5000 cGy in 25 sessions, right axillary boost 600 cGy 3 sessions                      BEAMS/ENERGY:   AP/PA fields, 6 MV photons with a reduced field after 5000 cGy in 25 sessions                NARRATIVE:      The patient tolerated treatment well with marked erythema and dry desquamation the skin by completion of therapy. She had  regression of her palpable axillary disease but she still had gross disease by completion of therapy.  We were limited in covering the inferior extent of her disease because of her previous chest wall treatment.                  PLAN: Routine followup in one month. Patient instructed to call if questions or worsening complaints in interim.

## 2012-11-02 NOTE — Progress Notes (Signed)
Weekly Management Note:  Site: Right axilla Current Dose:  5600  cGy Projected Dose: 5600  cGy  Narrative: The patient is seen today for routine under treatment assessment. CBCT/MVCT images/port films were reviewed. The chart was reviewed.   No new complaints today. She uses Radioplex gel when necessary.  Physical Examination:  Filed Vitals:   11/02/12 1432  BP: 144/65  Pulse: 56  Resp: 16  .  Weight: 105 lb 1.6 oz (47.673 kg). There is erythema along the right axilla with dry desquamation. No areas of moist desquamation. There is less mass effect along the posterior right axilla. Impression: Radiation therapy completed.  Plan: Followup visit in one month.

## 2012-11-11 ENCOUNTER — Other Ambulatory Visit: Payer: Self-pay

## 2012-11-18 ENCOUNTER — Encounter (INDEPENDENT_AMBULATORY_CARE_PROVIDER_SITE_OTHER): Payer: Medicare PPO | Admitting: General Surgery

## 2012-11-27 ENCOUNTER — Encounter: Payer: Self-pay | Admitting: Radiation Oncology

## 2012-12-01 ENCOUNTER — Encounter: Payer: Self-pay | Admitting: Radiation Oncology

## 2012-12-01 ENCOUNTER — Ambulatory Visit
Admission: RE | Admit: 2012-12-01 | Discharge: 2012-12-01 | Disposition: A | Discharge status: Home / Self Care | Payer: Medicare PPO | Source: Ambulatory Visit | Attending: Radiation Oncology | Admitting: Radiation Oncology

## 2012-12-01 VITALS — BP 154/69 | HR 75 | Temp 98.4°F | Resp 16 | Wt 104.1 lb

## 2012-12-01 DIAGNOSIS — C499 Malignant neoplasm of connective and soft tissue, unspecified: Secondary | ICD-10-CM

## 2012-12-01 NOTE — Progress Notes (Signed)
Followup note:  Veronica Ellison returns today approximately 1 month following completion of further radiation therapy in the management of her recurrent soft tissue sarcoma involving the right axilla. She is generally doing well and is most bothered by low back pain for which she occasionally takes Percocet in addition to OxyContin twice a day. She missed her last appointment with [name] and she will see him again on September 17. She does have some nausea, presumably from her narcotics. She occasionally has right chest wall discomfort when taking a deep breath.  His examination: Alert and oriented. Filed Vitals:   12/01/12 1044  BP: 154/69  Pulse: 75  Temp: 98.4 F (36.9 C)  Resp: 16   Head and neck examination: Grossly unremarkable. Nodes: Without palpable cervical or supraclavicular lymphadenopathy. There has been regression of her posterior axillary mass. Chest: There are no new areas of recurrent disease along her right chest wall. There is slight discomfort on palpation along the right paravertebral region where she had a close or positive margin, but did not give further radiation therapy because of the risk for toxicity having had a significant dose in that region. There is no mass along the paravertebral region. Radiation changes are noted along the right chest wall with extensive telangiectasia. Lungs are clear. Abdomen without hepatomegaly  Impression: Clinically stable. She is certainly at risk for further recurrence is along her right chest wall and metastatic disease to her lungs.  Plan: She'll see [name] on September 17. Follow visit here in 3 months.

## 2012-12-01 NOTE — Progress Notes (Signed)
Reports chronic low back pain for which she takes oxycontin twice per day and percocet once per day. Reports persistent nausea believed to be related to effects of pain medication. Skin of right/treated axilla normal color and appearance. Reports right axilla remains sore. Denies palpating any new lumps or bumps. Reports fatigue continues. Reports difficulty at times obtaining a deep breath.

## 2012-12-03 ENCOUNTER — Other Ambulatory Visit: Payer: Self-pay

## 2012-12-03 DIAGNOSIS — Z9012 Acquired absence of left breast and nipple: Secondary | ICD-10-CM

## 2012-12-03 DIAGNOSIS — Z1231 Encounter for screening mammogram for malignant neoplasm of breast: Secondary | ICD-10-CM

## 2012-12-23 ENCOUNTER — Encounter (INDEPENDENT_AMBULATORY_CARE_PROVIDER_SITE_OTHER): Payer: Self-pay | Admitting: General Surgery

## 2012-12-23 ENCOUNTER — Ambulatory Visit (INDEPENDENT_AMBULATORY_CARE_PROVIDER_SITE_OTHER): Payer: Medicare PPO | Admitting: General Surgery

## 2012-12-23 VITALS — BP 130/82 | HR 78 | Temp 99.1°F | Resp 14 | Ht 61.5 in | Wt 103.6 lb

## 2012-12-23 DIAGNOSIS — C493 Malignant neoplasm of connective and soft tissue of thorax: Secondary | ICD-10-CM

## 2012-12-23 DIAGNOSIS — C499 Malignant neoplasm of connective and soft tissue, unspecified: Secondary | ICD-10-CM

## 2012-12-23 NOTE — Progress Notes (Signed)
Subjective:     Patient ID: Veronica Ellison, female   DOB: 1921-08-24, 79 y.o.   MRN: [id number]  HPI Patient presents for followup of right chest wall sarcoma. She has right axillary metastasis. She completed radiation therapy to this area. She feels it is smaller and giving her less pain. She occasionally has back pain. She does have a history of chronic back pain for which he takes medication. She has noticed no new nodules on her back or side. Her daughter accompanies her and assists with history. Of note she also had a recent low level fall while kneeling down to clean the carpet and she has some pain in her left knee. She also complains of some nervousness and anxiety. She has increased stress with her husband now back home from the skilled nursing facility.  Review of Systems     Objective:   Physical Exam  Constitutional: She appears well-developed and well-nourished. No distress.  Eyes: Pupils are equal, round, and reactive to light. No scleral icterus.  Neck: Normal range of motion. Neck supple. No tracheal deviation present.  Cardiovascular: Normal rate and normal heart sounds.   Pulmonary/Chest: Effort normal. No respiratory distress. She has no wheezes.  Right axilla has 2 palpable nodules each about 2 cm in size with no significant tenderness. Overlying skin changes.  Right side and chest wall scar intact with chronic scarring without new nodularities felt, other areas in the paraspinous region also without any new nodularities.  Abdominal: Soft. She exhibits no distension. There is no tenderness.   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   Plan:     Axillary masses are improved after radiation. No other new areas of concern noted. Will follow up in 2 months.I recommended that if her knee pain continues that she see Dr. Valentina Gu. In light of her  anxiety, she should discuss this with Dr. Clelia Croft. This may be secondary to some of her medications and may also be complicated by stress at home.

## 2012-12-30 ENCOUNTER — Ambulatory Visit
Admission: RE | Admit: 2012-12-30 | Discharge: 2012-12-30 | Disposition: A | Discharge status: Home / Self Care | Payer: Medicare PPO | Source: Ambulatory Visit

## 2012-12-30 DIAGNOSIS — Z1231 Encounter for screening mammogram for malignant neoplasm of breast: Secondary | ICD-10-CM

## 2012-12-30 DIAGNOSIS — Z9012 Acquired absence of left breast and nipple: Secondary | ICD-10-CM

## 2013-01-28 ENCOUNTER — Other Ambulatory Visit (HOSPITAL_BASED_OUTPATIENT_CLINIC_OR_DEPARTMENT_OTHER): Payer: Medicare PPO | Admitting: Lab

## 2013-01-28 ENCOUNTER — Other Ambulatory Visit: Payer: Self-pay | Admitting: Internal Medicine

## 2013-01-28 ENCOUNTER — Ambulatory Visit (HOSPITAL_BASED_OUTPATIENT_CLINIC_OR_DEPARTMENT_OTHER): Payer: Medicare PPO | Admitting: Internal Medicine

## 2013-01-28 ENCOUNTER — Telehealth: Payer: Self-pay | Admitting: Internal Medicine

## 2013-01-28 VITALS — BP 153/63 | HR 71 | Temp 98.0°F | Resp 18 | Ht 61.5 in | Wt 104.8 lb

## 2013-01-28 DIAGNOSIS — C493 Malignant neoplasm of connective and soft tissue of thorax: Secondary | ICD-10-CM

## 2013-01-28 DIAGNOSIS — M810 Age-related osteoporosis without current pathological fracture: Secondary | ICD-10-CM

## 2013-01-28 DIAGNOSIS — M1990 Unspecified osteoarthritis, unspecified site: Secondary | ICD-10-CM

## 2013-01-28 DIAGNOSIS — C499 Malignant neoplasm of connective and soft tissue, unspecified: Secondary | ICD-10-CM

## 2013-01-28 DIAGNOSIS — C50919 Malignant neoplasm of unspecified site of unspecified female breast: Secondary | ICD-10-CM

## 2013-01-28 DIAGNOSIS — I4891 Unspecified atrial fibrillation: Secondary | ICD-10-CM

## 2013-01-28 LAB — CBC WITH DIFFERENTIAL/PLATELET
Eosinophils Absolute: 0.1 10*3/uL (ref 0.0–0.5)
HCT: 36.7 % (ref 34.8–46.6)
LYMPH%: 10.1 % — ABNORMAL LOW (ref 14.0–49.7)
MONO#: 0.4 10*3/uL (ref 0.1–0.9)
NEUT#: 4.6 10*3/uL (ref 1.5–6.5)
Platelets: 178 10*3/uL (ref 145–400)
RBC: 3.76 10*6/uL (ref 3.70–5.45)
WBC: 5.7 10*3/uL (ref 3.9–10.3)

## 2013-01-28 LAB — COMPREHENSIVE METABOLIC PANEL (CC13)
Albumin: 3.6 g/dL (ref 3.5–5.0)
Anion Gap: 8 mEq/L (ref 3–11)
CO2: 27 mEq/L (ref 22–29)
Calcium: 9.5 mg/dL (ref 8.4–10.4)
Chloride: 106 mEq/L (ref 98–109)
Glucose: 106 mg/dl (ref 70–140)
Sodium: 141 mEq/L (ref 136–145)
Total Bilirubin: 0.42 mg/dL (ref 0.20–1.20)
Total Protein: 7 g/dL (ref 6.4–8.3)

## 2013-01-28 NOTE — Patient Instructions (Signed)
Fall Prevention, Elderly Falls are the leading cause of injuries, accidents, and accidental deaths in people over the age of 65. Falling is a real threat to your ability to live on your own. CAUSES    Poor eyesight or poor hearing can make you more likely to fall.   Illnesses and physical conditions can affect your strength and balance.   Poor lighting, throw rugs and pets in your home can make you more likely to trip or slip.   The side effects of some medicines can upset your balance and lead to falling. These include medicines for depression, sleep problems, high blood pressure, diabetes, and heart conditions.  PREVENTION   Be sure your home is as safe as possible. Here are some tips:  Wear shoes with non-skid soles (not house slippers).   Be sure your home and outside area are well lit.   Use night lights throughout your house, including hallways and stairways.   Remove clutter and clean up spills on floors and walkways.   Remove throw rugs or fasten them to the floor with carpet tape. Tack down carpet edges.   Do not place electrical cords across pathways.   Install grab bars in your bathtub, shower, and toilet area. Towel bars should not be used as a grab bar.   Install handrails on both sides of stairways.   Do not climb on stools or stepladders. Get someone else to help with jobs that require climbing.   Do not wax your floors at all, or use a non-skid wax.   Repair uneven or unsafe sidewalks, walkways or stairs.   Keep frequently used items within reach.   Be aware of pets so you do not trip.  Get regular check-ups from your doctor, and take good care of yourself:  Have your eyes checked every year for vision changes, cataracts, glaucoma, and other eye problems. Wear eyeglasses as directed.   Have your hearing checked every 2 years, or anytime you or others think that you cannot hear well. Use hearing aids as directed.   See your caregiver if you have foot pain  or corns. Sore feet can contribute to falls.   Let your caregiver know if a medicine is making you feel dizzy or making you lose your balance.   Use a cane, walker, or wheelchair as directed. Use walker or wheelchair brakes when getting in and out.   When you get up from bed, sit on the side of the bed for 1 to 2 minutes before you stand up. This will give your blood pressure time to adjust, and you will feel less dizzy.   If you need to go to the bathroom often, consider using a bedside commode.  Keep your body in good shape:  Get regular exercise, especially walking.   Do exercises to strengthen the muscles you use for walking and lifting.   Do not smoke.   Minimize use of alcohol.  SEEK IMMEDIATE MEDICAL CARE IF:    You feel dizzy, weak, or unsteady on your feet.   You feel confused.   You fall.  Document Released: 03/25/2005 Document Revised: 03/14/2011 Document Reviewed: 09/19/2006 [employer] Patient Information 2012 [employer]. 

## 2013-01-28 NOTE — Telephone Encounter (Signed)
gave pt appt for lab and MD  °

## 2013-01-31 NOTE — Progress Notes (Signed)
[facility] OFFICE PROGRESS NOTE  SHAW,KIMBERLEE, MD [address] [place] [address]  DIAGNOSIS: Soft tissue sarcoma of chest wall - Plan: CBC with Differential, Comprehensive metabolic panel  Breast cancer, unspecified laterality  Sarcoma  Chief Complaint  Patient presents with  . 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 Pathologic stage pT2 cN0 M0. She had another recurrence outside of the previous field of radiation; and had resection on February 16, 2010 with same pathology but positive margin. She is also s/p adjuvant XRT for each time she had resection. She was started on 06/26/10 with adjuvant chemotherapy with continuous infusion of Adriamycin/Cytoxan over 7 days q4 wks. There was plan for at least 5 cycles. However, she developed recurrent mucositis, worsening performance status, and unrelated problem of severe back pain from degenerative joint disease. Therefore, adjuvant chemo was discontinued after two cycles. She had been on watchful observation until she developed local recurrent disease in August 2012. She underwent repeat resection on December 27, 2010. She was deemed not be be candidate for repeat radiation. She developed recurrence in the right axilla and underwent resection on 08/14/2012 followed by radiation to the right axilla started on 09/23/2012 completed on 11/02/2012.   CURRENT THERAPY: Observation  INTERVAL HISTORY:  Veronica Ellison 79 y.o. female returns for regular follow up with her daughter Veronica Ellison. She reports feeling well. She lives with her daughter. She is independent of activities of daily living. She has chronic left chest wall pain from recurrent sarcoma. She underwent resection of her right  axilla sarcoma and was last seen by Dr. Dayton Scrape on 12/01/2012. Her follow-up is next month.  She has healed well. She completed radiation about three months to the right axilla. Today,  She denies cough, CP, hemoptysis. The rest of the 14-point review of system was negative.   MEDICAL HISTORY: Past Medical History  Diagnosis Date  . Breast cancer 1996    (Lt) breast ca dx 1996  . 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 Hyperlipidemia   . Arthritis     osteoarthritis  . IBD (inflammatory bowel disease)   . Gallstones   . Fracture of left hand   . Stroke     "mild stroke in 2012"  . Vertigo     hx of  . Pneumonia     hx of  . Anemia     hx of  . Blood transfusion     "in 1999"  . Urinary tract infection     hx of  . GERD (gastroesophageal reflux disease)   . Headache(784.0)     "migraines hx of"  . Degenerative disc disease     hx of  . Hx of radiation therapy 07/2009 -08/2009, 04/2010- 05/2010    right chest wall  . History of radiation therapy 09/23/11-11/04/11    right paraspinal chest wall 560Gy   . A-fib   . Chronic back pain   . Wears glasses   . HOH (hard of hearing)   . Peripheral vascular disease     neuropathy in feet per patient  . History of radiation therapy 09/23/12-11/02/12    right axilla    INTERIM HISTORY: has Soft tissue sarcoma of chest  wall; Sarcoma; Breast cancer; Atrial fibrillation; and Chest pain on her problem list.    ALLERGIES:  is allergic to other; amoxicillin; chocolate; peanut-containing drug products; adhesive; ampicillin; meperidine and related; morphine and related; nitrofurantoin; sulfa antibiotics; and tetracyclines & related.  MEDICATIONS: has a current medication list which includes the following prescription(s): alendronate, amiodarone, aspirin, calcium carbonate-vitamin d, docusate sodium, multivitamins with iron, nitroglycerin, omeprazole, ondansetron, oxycodone-acetaminophen, oxycontin, restasis, sertraline, and  wound cleansers.  SURGICAL HISTORY:  Past Surgical History  Procedure Laterality Date  . Mastectomy  1996  . Abdominal hysterectomy  1999  . Bowel resection  1999  . Cholecystectomy  2005  . 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 Sarcoma excision  12/27/10    rt axillary and back  . Eye surgery      bilateral cataract surgery  . Kyphosis surgery       x 3  . Mass excision  08/12/2011    sarcoma-Procedure: EXCISION MASS;  Surgeon: Liz Malady, MD;  Location[facility] OR;  Service: General;  Laterality: Right;  excision mass right paraspinous area, excision mass right chest wall  . Mass excision Right 08/14/2012    Procedure: EXCISION MASS RIGHT AXIULA and right back;  Surgeon: Liz Malady, MD;  Location: [facility];  Service: General;  Laterality: Right;    REVIEW OF SYSTEMS:   Constitutional: Denies fevers, chills or abnormal weight loss; Positive for chronic back pain.  Eyes: Denies blurriness of vision Ears, nose, mouth, throat, and face: Denies mucositis or sore throat Respiratory: Denies cough, dyspnea or wheezes Cardiovascular: Denies palpitation, chest discomfort or lower extremity swelling Gastrointestinal:  Denies nausea, heartburn or change in bowel habits Skin: Reports one tender swelling on the posterior back Lymphatics: Denies new lymphadenopathy or easy bruising Neurological:Denies numbness, tingling or new weaknesses Behavioral/Psych: Mood is stable, no new changes  All other systems were reviewed with the patient and are negative.  PHYSICAL EXAMINATION: ECOG PERFORMANCE STATUS: 0 - Asymptomatic  Blood pressure 153/63, pulse 71, temperature 98 F (36.7 C), temperature source Oral, resp. rate 18, height 5' 1.5" (1.562 m), weight 104 lb 12.8 oz (47.537 kg).  GENERAL:Thin-appearing woman, alert, no distress and comfortable; slightly kyphotic. SKIN: skin color, texture, turgor are normal, no rashes or  significant lesions; scar in the posterior left chest wall, hyperkaratotic; small dime-sized swelling mildly ttp over the right upper posterior back. EYES: normal, Conjunctiva are pink and non-injected, sclera clear OROPHARYNX:no exudate, no erythema and lips, buccal mucosa, and tongue normal  NECK: supple, thyroid normal size, non-tender, without nodularity LYMPH:  no palpable lymphadenopathy in the cervical, axillary or supraclavicular LUNGS: clear to auscultation and percussion with normal breathing effort HEART: regular rate & rhythm and no murmurs and no lower extremity edema ABDOMEN:abdomen soft, non-tender and normal bowel sounds Musculoskeletal:no cyanosis of digits and no clubbing  NEURO: alert & oriented x 3 with fluent speech, no focal motor/sensory deficits  Labs:  Lab Results  Component Value Date   WBC 5.7 01/28/2013   HGB 12.1 01/28/2013   HCT 36.7 01/28/2013   MCV 97.7 01/28/2013   PLT 178 01/28/2013   NEUTROABS 4.6 01/28/2013      Chemistry      Component Value Date/Time   NA 141 01/28/2013 1234   NA 140 09/30/2012 1020   K 4.6 01/28/2013 1234   K 3.9 09/30/2012 1020   CL 105 09/30/2012 1020   CL 103 09/28/2012  1517   CO2 27 01/28/2013 1234   CO2 28 09/30/2012 1020   BUN 20.8 01/28/2013 1234   BUN 19 09/30/2012 1020   CREATININE 0.8 01/28/2013 1234   CREATININE 0.81 09/30/2012 1020      Component Value Date/Time   CALCIUM 9.5 01/28/2013 1234   CALCIUM 8.8 09/30/2012 1020   ALKPHOS 82 01/28/2013 1234   ALKPHOS 80 11/29/2011 1036   AST 21 01/28/2013 1234   AST 18 11/29/2011 1036   ALT 13 01/28/2013 1234   ALT 13 11/29/2011 1036   BILITOT 0.42 01/28/2013 1234   BILITOT 0.3 11/29/2011 1036     Basic Metabolic Panel:  Recent Labs Lab 01/28/13 1234  NA 141  K 4.6  CO2 27  GLUCOSE 106  BUN 20.8  CREATININE 0.8  CALCIUM 9.5   GFR Estimated Creatinine Clearance: 35.7 ml/min (by C-G formula based on Cr of 0.8). Liver Function Tests:  Recent Labs Lab  01/28/13 1234  AST 21  ALT 13  ALKPHOS 82  BILITOT 0.42  PROT 7.0  ALBUMIN 3.6   CBC:  Recent Labs Lab 01/28/13 1234  WBC 5.7  NEUTROABS 4.6  HGB 12.1  HCT 36.7  MCV 97.7  PLT 178   Studies:  No results found.   RADIOGRAPHIC STUDIES: No results found.  ASSESSMENT: RMANI KAPUSTA 79 y.o. female with a history of Soft tissue sarcoma of chest wall - Plan: CBC with Differential, Comprehensive metabolic panel  Breast cancer, unspecified laterality  Sarcoma   PLAN:  1. History of recurrent sarcoma. She had a local recurrence to the right axilla in May 2014. Status post excision and XRT. She does not have symptoms suggesting of metastatic disease. Her labs were reviewed in detail and they were all in normal limits.  2.Posterior back lesion.  It was about the size of dime. Not sure if it related to #1.  Patient will continue to watch this for increase in size or worsening pain.  Daughter was in agreement.   2. Osteoarthritis, degenerative joint disease, and sacral compression fracture. She is on pain med prn.  3. Atrial fibrillation. She is on amiodarone. She is off of Coumadin now due to risk of bleeding.  5. History of osteoporosis. She is on calcium/Vit D/alendronate per PCP.  6.  Follow up: in about 4 months.   All questions were answered. The patient knows to call the clinic with any problems, questions or concerns. We can certainly see the patient much sooner if necessary.  I spent 15 minutes counseling the patient face to face. The total time spent in the appointment was 25 minutes.    Petrea Fredenburg, MD 01/31/2013 7:05 PM

## 2013-02-11 ENCOUNTER — Other Ambulatory Visit: Payer: Self-pay

## 2013-02-24 ENCOUNTER — Ambulatory Visit (INDEPENDENT_AMBULATORY_CARE_PROVIDER_SITE_OTHER): Payer: Medicare PPO | Admitting: General Surgery

## 2013-02-24 ENCOUNTER — Encounter (INDEPENDENT_AMBULATORY_CARE_PROVIDER_SITE_OTHER): Payer: Self-pay | Admitting: General Surgery

## 2013-02-24 VITALS — BP 128/72 | HR 70 | Temp 98.1°F | Resp 16 | Ht 61.5 in | Wt 104.6 lb

## 2013-02-24 DIAGNOSIS — C493 Malignant neoplasm of connective and soft tissue of thorax: Secondary | ICD-10-CM

## 2013-02-24 NOTE — Progress Notes (Signed)
Subjective:     Patient ID: Veronica Ellison, female   DOB: 1921/09/15, 79 y.o.   MRN: [id number]  HPI Patient presents for followup of right chest wall sarcoma with local and regional recurrences. Most recently, she underwent right axillary XRT by Dr. Dayton Scrape. She has some pain in her right axilla as well as a little bit in her back. Her husband is back home from skilled nursing and is now undergoing hospice care.  Review of Systems     Objective:   Physical Exam  Constitutional: She appears well-developed. No distress.  Neck: Neck supple.  Cardiovascular: Normal rate.   Pulmonary/Chest: Effort normal and breath sounds normal.  Abdominal: Soft.   Right axilla palpable tumor seems somewhat bigger. It feels like it extends to her lateral axilla/latissimus area. It is mildly tender. Back excision sites and right chest wall excision sites appear stable with scarring and no new nodularity is    Assessment:     Sarcoma right chest wall with recurrent areas of regional metastases right axilla, status post XRT    Plan:     Patient is not a candidate for further resection of this axillary area without significant disability. We will continue to follow. I believe she has completed all XRT that she can safely have.  At the end of the visit she showed me 2 areas of hard callus on her right middle finger and right palm. She previously had this excised by Dr. Sherrilyn Rist. I recommend if they're bothering her she should see her again.  See her back in January. I also spoke with her daughter.

## 2013-02-25 ENCOUNTER — Encounter: Payer: Self-pay | Admitting: *Deleted

## 2013-02-25 DIAGNOSIS — Z923 Personal history of irradiation: Secondary | ICD-10-CM | POA: Insufficient documentation

## 2013-03-02 ENCOUNTER — Ambulatory Visit
Admission: RE | Admit: 2013-03-02 | Discharge: 2013-03-02 | Disposition: A | Discharge status: Home / Self Care | Payer: Medicare PPO | Source: Ambulatory Visit | Attending: Radiation Oncology | Admitting: Radiation Oncology

## 2013-03-02 ENCOUNTER — Encounter: Payer: Self-pay | Admitting: Radiation Oncology

## 2013-03-02 VITALS — BP 148/69 | HR 77 | Temp 97.8°F | Resp 16 | Wt 103.8 lb

## 2013-03-02 DIAGNOSIS — C493 Malignant neoplasm of connective and soft tissue of thorax: Secondary | ICD-10-CM

## 2013-03-02 NOTE — Progress Notes (Signed)
CC: Dr. Violeta Gelinas  Followup note:  Veronica Ellison returns today approximately 4 months following further radiation therapy in the management of her recurrent soft tissue sarcoma involving the right axilla. She had surgical excision back in may of 2014. Total resection was not possible. Her major complaint is that of chronic low back pain for which she takes Percocet and OxyContin. She does report right axillary discomfort as well.  Physical examination: Alert and oriented. Filed Vitals:   03/02/13 1018  BP: 148/69  Pulse: 77  Temp: 97.8 F (36.6 C)  Resp: 16   Head and neck examination: Grossly unremarkable. Nodes: There is no palpable cervical or supraclavicular lymphadenopathy. On palpation of the right axilla there is at least a 5 cm indurated mass along the posterior inferior axilla which may be part mass and part induration from her radiation therapy. There is also induration along the superior axilla which is most noticeable when she extends her right arm. There is no palpable evidence for recurrent disease along her right chest wall scar and right paravertebral region. Radiation fibrosis implant dictation are noted. Lungs are clear. There is no right upper extremity lymphedema. Neurologic examination is grossly nonfocal.  Impression: I am concerned about residual disease along the posterior right axilla, but I'm unsure as to how much of this is from induration of soft tissue for radiation therapy versus combination of tumor and soft tissue radiation changes. This may be better sorted out by CT or MR. She is not a candidate for further radiation therapy or surgery, so she may simply be followed.  Plan: Followup visit with [name] in January 2015. Follow visit with me in 3 months.

## 2013-03-02 NOTE — Progress Notes (Signed)
Follow up s/p rad txs right axilla   09/23/12-11/02/12  C/o soreness there still,  And at times pain there, takes percocet,  For that and back pain, saw Dr. Maisie Fus 02/24/13, f/u back in January 2015, sees Med ONc appt 05/31/12  appetie not too good, but a little better stated, weak,fatigued,  10:24 AM

## 2013-04-05 ENCOUNTER — Encounter: Payer: Self-pay | Admitting: Cardiology

## 2013-04-05 DIAGNOSIS — C50919 Malignant neoplasm of unspecified site of unspecified female breast: Secondary | ICD-10-CM

## 2013-04-05 DIAGNOSIS — J301 Allergic rhinitis due to pollen: Secondary | ICD-10-CM | POA: Insufficient documentation

## 2013-04-05 DIAGNOSIS — E538 Deficiency of other specified B group vitamins: Secondary | ICD-10-CM | POA: Insufficient documentation

## 2013-04-05 DIAGNOSIS — I10 Essential (primary) hypertension: Secondary | ICD-10-CM

## 2013-04-05 DIAGNOSIS — E782 Mixed hyperlipidemia: Secondary | ICD-10-CM | POA: Insufficient documentation

## 2013-04-05 DIAGNOSIS — Z853 Personal history of malignant neoplasm of breast: Secondary | ICD-10-CM | POA: Insufficient documentation

## 2013-04-05 DIAGNOSIS — F329 Major depressive disorder, single episode, unspecified: Secondary | ICD-10-CM | POA: Insufficient documentation

## 2013-04-05 DIAGNOSIS — R269 Unspecified abnormalities of gait and mobility: Secondary | ICD-10-CM

## 2013-04-09 ENCOUNTER — Other Ambulatory Visit: Payer: Self-pay | Admitting: Physician Assistant

## 2013-04-09 ENCOUNTER — Ambulatory Visit
Admission: RE | Admit: 2013-04-09 | Discharge: 2013-04-09 | Disposition: A | Discharge status: Home / Self Care | Payer: Medicare PPO | Source: Ambulatory Visit | Attending: Physician Assistant | Admitting: Physician Assistant

## 2013-04-09 DIAGNOSIS — R059 Cough, unspecified: Secondary | ICD-10-CM

## 2013-04-09 DIAGNOSIS — R05 Cough: Secondary | ICD-10-CM

## 2013-04-13 ENCOUNTER — Ambulatory Visit: Payer: Medicare PPO | Admitting: Interventional Cardiology

## 2013-04-21 ENCOUNTER — Telehealth: Payer: Self-pay | Admitting: *Deleted

## 2013-04-21 MED ORDER — AMIODARONE HCL 200 MG PO TABS: 200.0000 mg | ORAL_TABLET | Freq: Every morning | ORAL | Status: DC

## 2013-04-21 NOTE — Telephone Encounter (Signed)
REFILLED

## 2013-04-21 NOTE — Telephone Encounter (Signed)
Patients daughter request amiodarone refill to be [facility] to [facility] [address]. Thanks, MI

## 2013-04-22 ENCOUNTER — Ambulatory Visit: Payer: Medicare PPO | Admitting: Interventional Cardiology

## 2013-04-28 ENCOUNTER — Encounter (INDEPENDENT_AMBULATORY_CARE_PROVIDER_SITE_OTHER): Payer: Self-pay | Admitting: General Surgery

## 2013-04-28 ENCOUNTER — Ambulatory Visit (INDEPENDENT_AMBULATORY_CARE_PROVIDER_SITE_OTHER): Payer: Medicare PPO | Admitting: General Surgery

## 2013-04-28 VITALS — BP 146/78 | HR 68 | Temp 98.3°F | Resp 14 | Ht 61.0 in | Wt 104.2 lb

## 2013-04-28 DIAGNOSIS — C493 Malignant neoplasm of connective and soft tissue of thorax: Secondary | ICD-10-CM

## 2013-04-28 NOTE — Progress Notes (Signed)
Subjective:     Patient ID: Veronica Ellison, female   DOB: [birth year], 80 y.o.   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 Most recently, she has undergone repeated adjuvant XRT to her right axilla. The mass there is giving her more pain. She feels it is getting larger. Unfortunately she lost her husband in December and is having a difficult time.  Review of Systems     Objective:   Physical Exam  Constitutional: She is oriented to person, place, and time.  Neck: Neck supple. No tracheal deviation present.  Cardiovascular:  Irregularly irregular  Pulmonary/Chest: Effort normal. No stridor. No respiratory distress. She has no wheezes.    Right Mass is larger, now 8 cm and tender, additionally there is a new area of tenderness along her anterior right lower rib margin without discrete mass, no new masses along the rest of her previous scar from excisions and along her spine  Abdominal: Soft. She exhibits no distension.  Neurological: She is alert and oriented to person, place, and time.       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 Chemotherapy has not been an option for many years. I feel we did focus on palliation. I offered Lidoderm patch to apply to the axilla. She wants to wait and see if the pain gets worse. She is going to followup with Dr. Valere Dross. I doubt further XRT is available but we will see what he thinks. I will see her back in 2 months. She will call if she changes her mind about the Lidoderm patch.

## 2013-05-11 ENCOUNTER — Telehealth: Payer: Self-pay | Admitting: Internal Medicine

## 2013-05-11 ENCOUNTER — Telehealth: Payer: Self-pay

## 2013-05-11 NOTE — Telephone Encounter (Signed)
Daughter called at 1 pm requesting an appt sooner than 2/23. Feels her cancer is progressing. Pt having increased pain. Is using her percocet and oxycontin. Daughter stated next week would be fine. POF sent.

## 2013-05-11 NOTE — Telephone Encounter (Signed)
S/w the pt's dtr and she is aware of the appt date and time change from 05/31/2013 to 05/18/2013.

## 2013-05-12 ENCOUNTER — Ambulatory Visit (INDEPENDENT_AMBULATORY_CARE_PROVIDER_SITE_OTHER): Payer: Medicare PPO | Admitting: Interventional Cardiology

## 2013-05-12 ENCOUNTER — Encounter: Payer: Self-pay | Admitting: Interventional Cardiology

## 2013-05-12 VITALS — BP 130/69 | HR 79 | Ht 61.0 in | Wt 103.0 lb

## 2013-05-12 DIAGNOSIS — R011 Cardiac murmur, unspecified: Secondary | ICD-10-CM

## 2013-05-12 DIAGNOSIS — Z79899 Other long term (current) drug therapy: Secondary | ICD-10-CM

## 2013-05-12 DIAGNOSIS — I10 Essential (primary) hypertension: Secondary | ICD-10-CM

## 2013-05-12 DIAGNOSIS — R079 Chest pain, unspecified: Secondary | ICD-10-CM

## 2013-05-12 DIAGNOSIS — I4891 Unspecified atrial fibrillation: Secondary | ICD-10-CM

## 2013-05-12 MED ORDER — NITROGLYCERIN 0.4 MG SL SUBL
0.4000 mg | SUBLINGUAL_TABLET | SUBLINGUAL | Status: AC | PRN
Start: 2013-05-12 — End: ?

## 2013-05-12 NOTE — Progress Notes (Signed)
Patient ID: Veronica Ellison, female   DOB: [birth year], 80 y.o.   MRN: [id number]    [address], [address], [place]  [address] Phone: [phone] Fax:  [phone]  Date:  05/12/2013   ID:  Veronica Ellison, DOB [birth year], MRN [id number]  PCP:  Mayra Neer, MD      History of Present Illness: Veronica Ellison is a 80 y.o. female who has AFib and chest pain. She is limited by back pain. She uses a walker and can get around prety well. She had shots in her spine which lasted only 2 weeks several years ago.  Now maintained with pain meds.   c/o Shortness of breath with severe activity.  Denies :   Dizziness.  Leg edema.  Palpitations.  Syncope.   Headaches.  Concern about her eyes due to amiodarone.  She wants a new Rx for NTG, but she has not used it recently.  Rare Chest pain.    Wt Readings from Last 3 Encounters:  05/12/13 103 lb (46.72 kg)  04/28/13 104 lb 3.2 oz (47.265 kg)  03/02/13 103 lb 12.8 oz (47.083 kg)     Past Medical History  Diagnosis Date  . Breast cancer 1996    (Lt) breast ca dx 1996  . 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 Hyperlipidemia   . Arthritis     osteoarthritis  . IBD (inflammatory bowel disease)   . Gallstones   . Fracture of left hand   . Stroke     "mild stroke in 2012"  . Vertigo     hx of  . Pneumonia     hx of  . Anemia     hx of  . Blood transfusion     "in 1999"  . Urinary tract infection     hx of  . GERD (gastroesophageal reflux disease)   . Headache(784.0)     "migraines hx of"  . Degenerative disc disease     hx of  . Hx of radiation therapy 07/2009 -08/2009, 04/2010- 05/2010    right chest wall  . History of radiation therapy 09/23/11-11/04/11    right paraspinal chest wall 560Gy   . A-fib   . Chronic back pain   . Wears glasses   . HOH (hard of hearing)   . Peripheral vascular disease     neuropathy in feet per patient  . History of radiation therapy 09/23/12-11/02/12    right  axilla, 5000 cGy 25 sessions, boost 600 cGy 3 sessions  . Hypertension   . MVP (mitral valve prolapse)     Current Outpatient Prescriptions  Medication Sig Dispense Refill  . alendronate (FOSAMAX) 70 MG tablet Take 70 mg by mouth every 7 (seven) days. Take with a full glass of water on an empty stomach.      Marland Kitchen amiodarone (PACERONE) 200 MG tablet Take 1 tablet (200 mg total) by mouth every morning.  30 tablet  1  . aspirin 325 MG tablet Take 325 mg by mouth every evening.       . Calcium Carbonate-Vitamin D (CALTRATE 600+D PO) Take 1 tablet by mouth 2 (two) times daily.       Marland Kitchen docusate sodium (COLACE) 100 MG capsule Take 300 mg by mouth every evening.       . Multiple Vitamins-Iron (MULTIVITAMINS WITH IRON) TABS Take 1 tablet by mouth every morning.       Marland Kitchen  nitroGLYCERIN (NITROSTAT) 0.4 MG SL tablet Place 0.4 mg under the tongue every 5 (five) minutes as needed. For Chest pain      . omeprazole (PRILOSEC) 20 MG capsule Take 20 mg by mouth every morning.       . ondansetron (ZOFRAN) 4 MG tablet Take 1 tablet (4 mg total) by mouth every 8 (eight) hours as needed for nausea.  20 tablet  1  . oxyCODONE-acetaminophen (PERCOCET) 7.5-325 MG per tablet Take 1 tablet by mouth every 6 (six) hours as needed for pain.  40 tablet  0  . OXYCONTIN 10 MG T12A 10 mg 2 (two) times daily.       . RESTASIS 0.05 % ophthalmic emulsion Place 1 drop into both eyes daily.       . sertraline (ZOLOFT) 50 MG tablet Take 50 mg by mouth every evening.       . Wound Cleansers (RADIAPLEX EX) Apply 1 application topically as needed (radiation).        No current facility-administered medications for this visit.    Allergies:    Allergies  Allergen Reactions  . Other     Allergic to all nuts. Gets sores in mouth and fever blisters  . Amoxicillin Diarrhea  . Chocolate Other (See Comments)    fever blisters  . Demerol [Meperidine]   . Peanut-Containing Drug Products Other (See Comments)    Fever blisters  . Adhesive  [Tape] Other (See Comments)    Tears skin  . Ampicillin Other (See Comments)    unknown  . Meperidine And Related Itching  . Morphine And Related Other (See Comments)    Hallucinations  . Nitrofurantoin Other (See Comments)    Drug fever  . Sulfa Antibiotics Rash  . Tetracyclines & Related Rash    Causes bumps    Social History:  The patient  reports that she has never smoked. She has never used smokeless tobacco. She reports that she does not drink alcohol or use illicit drugs.   Family History:  The patient's family history includes Cancer in her brother and son; Diabetes in her mother; Heart disease in her brother, father, mother, and sister; Hypertension in her father; Kidney disease in her father.   ROS:  Please see the history of present illness.  No nausea, vomiting.  No fevers, chills.  No focal weakness.  No dysuria.   All other systems reviewed and negative.   PHYSICAL EXAM: VS:  BP 130/69  Pulse 79  Ht 5\' 1"  (1.549 m)  Wt 103 lb (46.72 kg)  BMI 19.47 kg/m2 Well nourished, well developed, in no acute distress HEENT: normal Neck: no JVD, no carotid bruits Cardiac:  normal S1, S2; RRR; 2/6 systolic murmur Lungs:  clear to auscultation bilaterally, no wheezing, rhonchi or rales Abd: soft, nontender, no hepatomegaly Ext: no edema Skin: warm and dry Neuro:   no focal abnormalities noted  EKG:  NSR in 6/14.     ASSESSMENT AND PLAN:  A-fib Continue Amiodarone HCl Tablet, 200 MG 1 tablet, qd or as directed by physician., Orally, Once a day, 30 days, 23, Refills 11  In NSR. No sustained sx of arrhythmia. Would continue current dose.   Monitoring of longterm med: LFTs ok in 10/14. She needs a TSH drawn at the cancer center.  This is scheduled for next week.  She needs LFTs and TSH every 6 month.    CP: Atypical.  Will refill SL NTG.  WOuld not pursue w/u given her overall  condition and age.  Would not likely pursue invasive testing unless it was an emergency.  SHOB:  Likely due to deconditioning.  No fluid overload on exam.    Murmur: Aortic sclerosis   Signed, Mina Marble, MD, [facility] 05/12/2013 11:33 AM

## 2013-05-12 NOTE — Patient Instructions (Signed)
Your physician recommends that you have a TSH drawn with your other labs on 05/18/13.   Refilled Nitroglycerin to [facility] [address].  Your physician wants you to follow-up in: 1 year with Dr. Irish Lack.  You will receive a reminder letter in the mail two months in advance. If you don't receive a letter, please call our office to schedule the follow-up appointment.

## 2013-05-18 ENCOUNTER — Other Ambulatory Visit (HOSPITAL_BASED_OUTPATIENT_CLINIC_OR_DEPARTMENT_OTHER): Payer: Medicare PPO

## 2013-05-18 ENCOUNTER — Ambulatory Visit (HOSPITAL_BASED_OUTPATIENT_CLINIC_OR_DEPARTMENT_OTHER): Payer: Medicare PPO | Admitting: Internal Medicine

## 2013-05-18 ENCOUNTER — Telehealth: Payer: Self-pay | Admitting: Internal Medicine

## 2013-05-18 ENCOUNTER — Encounter: Payer: Self-pay | Admitting: Internal Medicine

## 2013-05-18 VITALS — BP 138/54 | HR 66 | Temp 97.6°F | Resp 17 | Ht 61.0 in | Wt 104.8 lb

## 2013-05-18 DIAGNOSIS — C499 Malignant neoplasm of connective and soft tissue, unspecified: Secondary | ICD-10-CM

## 2013-05-18 DIAGNOSIS — Z79899 Other long term (current) drug therapy: Secondary | ICD-10-CM

## 2013-05-18 DIAGNOSIS — C50919 Malignant neoplasm of unspecified site of unspecified female breast: Secondary | ICD-10-CM

## 2013-05-18 DIAGNOSIS — I4891 Unspecified atrial fibrillation: Secondary | ICD-10-CM

## 2013-05-18 DIAGNOSIS — M810 Age-related osteoporosis without current pathological fracture: Secondary | ICD-10-CM

## 2013-05-18 DIAGNOSIS — C493 Malignant neoplasm of connective and soft tissue of thorax: Secondary | ICD-10-CM

## 2013-05-18 LAB — COMPREHENSIVE METABOLIC PANEL (CC13)
ALBUMIN: 3.8 g/dL (ref 3.5–5.0)
ALK PHOS: 80 U/L (ref 40–150)
ALT: 15 U/L (ref 0–55)
AST: 19 U/L (ref 5–34)
Anion Gap: 9 mEq/L (ref 3–11)
BILIRUBIN TOTAL: 0.44 mg/dL (ref 0.20–1.20)
BUN: 21.5 mg/dL (ref 7.0–26.0)
CO2: 28 mEq/L (ref 22–29)
CREATININE: 0.9 mg/dL (ref 0.6–1.1)
Calcium: 9.4 mg/dL (ref 8.4–10.4)
Chloride: 103 mEq/L (ref 98–109)
GLUCOSE: 98 mg/dL (ref 70–140)
POTASSIUM: 4.2 meq/L (ref 3.5–5.1)
Sodium: 140 mEq/L (ref 136–145)
Total Protein: 6.8 g/dL (ref 6.4–8.3)

## 2013-05-18 LAB — CBC WITH DIFFERENTIAL/PLATELET
BASO%: 0.3 % (ref 0.0–2.0)
BASOS ABS: 0 10*3/uL (ref 0.0–0.1)
EOS ABS: 0.1 10*3/uL (ref 0.0–0.5)
EOS%: 2.1 % (ref 0.0–7.0)
HEMATOCRIT: 38.1 % (ref 34.8–46.6)
HEMOGLOBIN: 11.9 g/dL (ref 11.6–15.9)
LYMPH%: 14.3 % (ref 14.0–49.7)
MCH: 31.2 pg (ref 25.1–34.0)
MCHC: 31.2 g/dL — ABNORMAL LOW (ref 31.5–36.0)
MCV: 100 fL (ref 79.5–101.0)
MONO#: 0.4 10*3/uL (ref 0.1–0.9)
MONO%: 6.2 % (ref 0.0–14.0)
NEUT%: 77.1 % — AB (ref 38.4–76.8)
NEUTROS ABS: 5.1 10*3/uL (ref 1.5–6.5)
PLATELETS: 219 10*3/uL (ref 145–400)
RBC: 3.81 10*6/uL (ref 3.70–5.45)
RDW: 15.3 % — ABNORMAL HIGH (ref 11.2–14.5)
WBC: 6.6 10*3/uL (ref 3.9–10.3)
lymph#: 1 10*3/uL (ref 0.9–3.3)

## 2013-05-18 NOTE — Progress Notes (Signed)
[facility] OFFICE PROGRESS NOTE  SHAW,KIMBERLEE, MD [address] [place] [address]  DIAGNOSIS: Sarcoma  Soft tissue sarcoma of chest wall  Malignant neoplasm of breast (female), unspecified site  Chief Complaint  Patient presents with  . 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 Pathologic stage pT2 cN0 M0. She had another recurrence outside of the previous field of radiation; and had resection on February 16, 2010 with same pathology but positive margin. She is also s/p adjuvant XRT for each time she had resection. She was started on 06/26/10 with adjuvant chemotherapy with continuous infusion of Adriamycin/Cytoxan over 7 days q4 wks. There was plan for at least 5 cycles. However, she developed recurrent mucositis, worsening performance status, and unrelated problem of severe back pain from degenerative joint disease. Therefore, adjuvant chemo was discontinued after two cycles. She had been on watchful observation until she developed local recurrent disease in August 2012. She underwent repeat resection on December 27, 2010. She was deemed not be be candidate for repeat radiation. She developed recurrence in the right axilla and underwent resection on 08/14/2012 followed by radiation to the right axilla started on 09/23/2012 completed on 11/02/2012.   CURRENT THERAPY: Observation  INTERVAL HISTORY:  Veronica [place] 80 y.o. female returns for regular follow up with her daughter Lattie Haw. She reports being stable overall.  She does have a dull ache underneath her right axillary area.  She lives with her daughter. She is independent of activities of daily living. She has chronic left chest wall pain from recurrent sarcoma. She will  see Dr. Valere Dross on the 24th of this month.  She is also scheduled to see her surgeon Dr. Grandville Silos in 2-3 weeks.  Her weight is stable.  When last seen by surgery and radiation oncology, they advised on continued observation because she was not a candidate for further surgery/radiation therapy. She takes OxyContin 10 mg bid and prn percocet for this and chronic low back pain.  She complains of her eyes feeling usual.  She reports that she was told it was not due to amiodarone.  She has scheduled a follow up with her opthalmology.    MEDICAL HISTORY: Past Medical History  Diagnosis Date  . Breast cancer 1996    (Lt) breast ca dx 1996  . 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 Hyperlipidemia   . Arthritis     osteoarthritis  . IBD (inflammatory bowel disease)   . Gallstones   . Fracture of left hand   . Stroke     "mild stroke in 2012"  . Vertigo     hx of  . Pneumonia     hx of  . Anemia     hx of  . Blood transfusion     "in 1999"  . Urinary tract infection     hx of  . GERD (gastroesophageal reflux disease)   . Headache(784.0)     "migraines hx of"  . Degenerative disc disease     hx of  . Hx of radiation therapy 07/2009 -08/2009, 04/2010- 05/2010    right chest wall  . History of radiation therapy 09/23/11-11/04/11    right paraspinal chest wall 560Gy   .  A-fib   . Chronic back pain   . Wears glasses   . HOH (hard of hearing)   . Peripheral vascular disease     neuropathy in feet per patient  . History of radiation therapy 09/23/12-11/02/12    right axilla, 5000 cGy 25 sessions, boost 600 cGy 3 sessions  . Hypertension   . MVP (mitral valve prolapse)     INTERIM HISTORY: has Soft tissue sarcoma of chest wall; Sarcoma; Breast cancer; Atrial fibrillation; Chest pain; History of radiation therapy; Essential hypertension, benign; Allergic rhinitis due to pollen; Mixed hyperlipidemia; Abnormality of gait; Major depressive disorder, single episode, unspecified;  Other B-complex deficiencies; Malignant neoplasm of breast (female), unspecified site; and Undiagnosed cardiac murmurs on her problem list.    ALLERGIES:  is allergic to other; amoxicillin; chocolate; demerol; peanut-containing drug products; adhesive; ampicillin; meperidine and related; morphine and related; nitrofurantoin; sulfa antibiotics; and tetracyclines & related.  MEDICATIONS: has a current medication list which includes the following prescription(s): alendronate, amiodarone, aspirin, calcium carbonate-vitamin d, docusate sodium, multivitamins with iron, nitroglycerin, omeprazole, ondansetron, oxycodone-acetaminophen, oxycontin, restasis, sertraline, and wound cleansers.  SURGICAL HISTORY:  Past Surgical History  Procedure Laterality Date  . Mastectomy  1996  . Abdominal hysterectomy  1999  . Bowel resection  1999  . Cholecystectomy  2005  . 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 Sarcoma excision  12/27/10    rt axillary and back  . Eye surgery      bilateral cataract surgery  . Kyphosis surgery       x 3  . Mass excision  08/12/2011    sarcoma-Procedure: EXCISION MASS;  Surgeon: Zenovia Jarred, MD;  Location: [facility];  Service: General;  Laterality: Right;  excision mass right paraspinous area, excision mass right chest wall  . Mass excision Right 08/14/2012    Procedure: EXCISION MASS RIGHT AXIULA and right back;  Surgeon: Zenovia Jarred, MD;  Location: [facility];  Service: General;  Laterality: Right;    REVIEW OF SYSTEMS:   Constitutional: Denies fevers, chills or abnormal weight loss; Positive for chronic back pain.  Eyes: Denies blurriness of vision Ears, nose, mouth, throat, and face: Denies mucositis or sore throat Respiratory: Denies cough, dyspnea or wheezes Cardiovascular: Denies palpitation, chest discomfort or lower extremity swelling Gastrointestinal:  Denies nausea, heartburn or change in bowel  habits Skin: Reports one tender swelling on the posterior back Lymphatics: Denies new lymphadenopathy or easy bruising Neurological:Denies numbness, tingling or new weaknesses Behavioral/Psych: Mood is stable, no new changes  All other systems were reviewed with the patient and are negative.  PHYSICAL EXAMINATION: ECOG PERFORMANCE STATUS: 0 - Asymptomatic  Blood pressure 138/54, pulse 66, temperature 97.6 F (36.4 C), temperature source Oral, resp. rate 17, height 5\' 1"  (1.549 m), weight 104 lb 12.8 oz (47.537 kg), SpO2 98.00%.  GENERAL:Thin-appearing woman, alert, no distress and comfortable; slightly kyphotic. SKIN: skin color, texture, turgor are normal, no rashes or significant lesions; scar in the posterior left chest wall, hyperkaratotic; small quarter-sized swelling mildly ttp over the right upper posterior back. EYES: normal, Conjunctiva are pink and non-injected, sclera clear OROPHARYNX:no exudate, no erythema and lips, buccal mucosa, and tongue normal  NECK: supple, thyroid normal size, non-tender, without nodularity LYMPH:  no palpable lymphadenopathy in the cervical, axillary or supraclavicular LUNGS: clear to auscultation and percussion with normal breathing effort HEART: regular rate & rhythm and no murmurs and no lower extremity edema  ABDOMEN:abdomen soft, non-tender and normal bowel sounds Musculoskeletal:no cyanosis of digits and no clubbing  NEURO: alert & oriented x 3 with fluent speech, no focal motor/sensory deficits  Labs:  Lab Results  Component Value Date   WBC 6.6 05/18/2013   HGB 11.9 05/18/2013   HCT 38.1 05/18/2013   MCV 100.0 05/18/2013   PLT 219 05/18/2013   NEUTROABS 5.1 05/18/2013      Chemistry      Component Value Date/Time   NA 140 05/18/2013 1209   NA 140 09/30/2012 1020   K 4.2 05/18/2013 1209   K 3.9 09/30/2012 1020   CL 105 09/30/2012 1020   CL 103 09/28/2012 1517   CO2 28 05/18/2013 1209   CO2 28 09/30/2012 1020   BUN 21.5 05/18/2013 1209   BUN  19 09/30/2012 1020   CREATININE 0.9 05/18/2013 1209   CREATININE 0.81 09/30/2012 1020      Component Value Date/Time   CALCIUM 9.4 05/18/2013 1209   CALCIUM 8.8 09/30/2012 1020   ALKPHOS 80 05/18/2013 1209   ALKPHOS 80 11/29/2011 1036   AST 19 05/18/2013 1209   AST 18 11/29/2011 1036   ALT 15 05/18/2013 1209   ALT 13 11/29/2011 1036   BILITOT 0.44 05/18/2013 1209   BILITOT 0.3 11/29/2011 1036     Basic Metabolic Panel:  Recent Labs Lab 05/18/13 1209  NA 140  K 4.2  CO2 28  GLUCOSE 98  BUN 21.5  CREATININE 0.9  CALCIUM 9.4   GFR Estimated Creatinine Clearance: 31.2 ml/min (by C-G formula based on Cr of 0.9). Liver Function Tests:  Recent Labs Lab 05/18/13 1209  AST 19  ALT 15  ALKPHOS 80  BILITOT 0.44  PROT 6.8  ALBUMIN 3.8   CBC:  Recent Labs Lab 05/18/13 1209  WBC 6.6  NEUTROABS 5.1  HGB 11.9  HCT 38.1  MCV 100.0  PLT 219   Studies:  No results found.   RADIOGRAPHIC STUDIES: No results found.  ASSESSMENT: Veronica [place] 80 y.o. female with a history of Sarcoma  Soft tissue sarcoma of chest wall  Malignant neoplasm of breast (female), unspecified site  PLAN:  1. History of recurrent sarcoma. She had a local recurrence to the right axilla in May 2014. Status post excision and XRT.  Her labs were reviewed in detail and they were all in normal limits. posterior back lesion concerning for recurrent sarcoma.  It was about the size of quarter and about size of dime three month ago.  Suggested lidocaine patch to the area of most pain but patient declined.    2. Osteoarthritis, degenerative joint disease, and sacral compression fracture. She is on pain med prn. We can increase her oxycontin to 15 mg bid (we will make this recommendation to her PCP's office).  3. Atrial fibrillation. She is on amiodarone. She is off of Coumadin now due to risk of bleeding.  5. History of osteoporosis. She is on calcium/Vit D/alendronate per PCP.  6.  Follow up: in about 2 months.    All questions were answered. The patient knows to call the clinic with any problems, questions or concerns. We can certainly see the patient much sooner if necessary.  I spent 15 minutes counseling the patient face to face. The total time spent in the appointment was 25 minutes.    Gerod Caligiuri, MD 05/18/2013 1:02 PM

## 2013-05-18 NOTE — Telephone Encounter (Signed)
Gave pt appt for MD only on April 2015 °

## 2013-05-25 NOTE — Progress Notes (Signed)
Stable hemoglobin and electrolytes.

## 2013-05-31 ENCOUNTER — Ambulatory Visit: Payer: Medicare PPO

## 2013-05-31 ENCOUNTER — Other Ambulatory Visit: Payer: Medicare PPO

## 2013-06-01 ENCOUNTER — Ambulatory Visit: Payer: Medicare PPO | Admitting: Radiation Oncology

## 2013-06-04 ENCOUNTER — Encounter: Payer: Self-pay | Admitting: *Deleted

## 2013-06-08 ENCOUNTER — Ambulatory Visit
Admission: RE | Admit: 2013-06-08 | Discharge: 2013-06-08 | Disposition: A | Discharge status: Home / Self Care | Payer: Medicare PPO | Source: Ambulatory Visit | Attending: Radiation Oncology | Admitting: Radiation Oncology

## 2013-06-08 ENCOUNTER — Encounter: Payer: Self-pay | Admitting: Radiation Oncology

## 2013-06-08 VITALS — BP 162/61 | HR 85 | Temp 98.2°F | Resp 20 | Wt 105.4 lb

## 2013-06-08 DIAGNOSIS — C493 Malignant neoplasm of connective and soft tissue of thorax: Secondary | ICD-10-CM

## 2013-06-08 NOTE — Progress Notes (Signed)
Follow up rad tx  Sot tissue right axilla/breast area 09/23/12-11/02/12, says no pain just soreness, says spots growing in that  area, and on back, and under breast bone, fatigued, appetite good, takes percocet and oxycontin  3:59 PM

## 2013-06-08 NOTE — Progress Notes (Addendum)
Followup note:  Ms. Tullo returns today, now age 80, approximately 7 months following completion of additional radiation therapy to her right axilla in the management of her recurrent soft tissue sarcoma involving the right axilla/chest wall. I last saw her 3 months ago at which time I was concerned about disease progression along the axilla. She continues with OxyContin and Percocet for chronic low back pain. Of note is that her husband passed away this past 2023/04/06. She continues to live with her daughter. She saw Dr. Juliann Mule earlier this, and she sees Dr. Grandville Silos later this month.  Physical examination: Alert and oriented. Filed Vitals:   06/08/13 1553  BP: 162/61  Pulse: 85  Temp: 98.2 F (36.8 C)  Resp: 20   Nodes: There is no palpable cervical or supraclavicular adenopathy. Chest is a bilobed indurated mass along the superior axilla measuring up to 8 cm in greatest diameter. There is minimal fixation to the subcutaneous tissues and there is no ulceration. Along the posterior axilla there remains an area of induration which may be radiation fibrosis rather than tumor. On inspection of the back there is a 3-4 mm nodule along the medial aspect of her scar which may represent scarring or an early recurrence. The remainder of the right chest wall is unremarkable. Lungs are clear. Extremities: Neuro-vascular examination along the right upper extremity is intact. There is no lymphedema.  Impression: She has progression of disease along her right axilla but there is no skin fixation/ulceration. She is essentially asymptomatic from her recurrent sarcoma, and I would simply follow her. Should she develop ulceration and we could give superficial electron beam radiation therapy to minimize drainage/further ulceration. She is not a candidate for deep photon irradiation because of the risk for nerve/vascular damage.  Followup visit with me in approximately 3 months.  15 minutes was spent face-to-face  with the patient, primarily counseling the patient.

## 2013-06-15 ENCOUNTER — Other Ambulatory Visit: Payer: Self-pay | Admitting: Interventional Cardiology

## 2013-06-23 ENCOUNTER — Encounter (INDEPENDENT_AMBULATORY_CARE_PROVIDER_SITE_OTHER): Payer: Self-pay | Admitting: General Surgery

## 2013-06-23 ENCOUNTER — Ambulatory Visit (INDEPENDENT_AMBULATORY_CARE_PROVIDER_SITE_OTHER): Payer: Medicare PPO | Admitting: General Surgery

## 2013-06-23 VITALS — BP 128/68 | HR 72 | Temp 97.9°F | Resp 14 | Ht 61.5 in | Wt 105.8 lb

## 2013-06-23 DIAGNOSIS — C493 Malignant neoplasm of connective and soft tissue of thorax: Secondary | ICD-10-CM

## 2013-06-23 NOTE — Progress Notes (Signed)
Subjective:     Patient ID: Veronica Ellison, female   DOB: [birth year], 80 y.o.   MRN: [id number]  HPI  Is for followup of right chest wall circumflex. She has known local and regional metastatic disease, especially involving her right axilla and latissimus. She has some increased pain in her right axilla and it limits her arm movement. Review of Systems     Objective:   Physical Exam  Constitutional: She appears well-developed.  HENT:  Head: Normocephalic.  Eyes: EOM are normal. Pupils are equal, round, and reactive to light. Right eye exhibits no discharge. Left eye exhibits no discharge.  Neck: Normal range of motion.  Cardiovascular:  Irregularly irregular  Pulmonary/Chest: Effort normal and breath sounds normal. No respiratory distress. She has no wheezes.   Dull to percussion:  or the primary site of scar tissue extending along ribs laterally and anteriorly on the right lower chest with no changes.    Mass right axilla has enlarged to 5 cm with associated extension going 12 cm latissimus onto her back with a bilobed nature, skin is intact, skin is thinnest in the axilla, and the wound, paraspinous area has no significant new growth,  Abdominal: Soft. There is no tenderness.       Assessment:     Enlargement of regional metastases of right chest wall sarcoma    Plan:     No further radiation is possible. I will see her back in 2 months to ensure she is not having skin breakdown. Unfortunately we don't have any other options. Plan was discussed in detail with her and her daughter.

## 2013-07-16 ENCOUNTER — Encounter: Payer: Self-pay | Admitting: Internal Medicine

## 2013-07-16 ENCOUNTER — Telehealth: Payer: Self-pay | Admitting: Hematology and Oncology

## 2013-07-16 ENCOUNTER — Ambulatory Visit (HOSPITAL_BASED_OUTPATIENT_CLINIC_OR_DEPARTMENT_OTHER): Payer: Medicare PPO | Admitting: Internal Medicine

## 2013-07-16 ENCOUNTER — Other Ambulatory Visit: Payer: Self-pay | Admitting: Hematology and Oncology

## 2013-07-16 VITALS — BP 124/55 | HR 67 | Temp 97.9°F | Resp 19 | Ht 61.5 in | Wt 107.7 lb

## 2013-07-16 DIAGNOSIS — C50919 Malignant neoplasm of unspecified site of unspecified female breast: Secondary | ICD-10-CM

## 2013-07-16 DIAGNOSIS — C499 Malignant neoplasm of connective and soft tissue, unspecified: Secondary | ICD-10-CM

## 2013-07-16 DIAGNOSIS — C493 Malignant neoplasm of connective and soft tissue of thorax: Secondary | ICD-10-CM

## 2013-07-16 DIAGNOSIS — Z923 Personal history of irradiation: Secondary | ICD-10-CM

## 2013-07-16 DIAGNOSIS — C773 Secondary and unspecified malignant neoplasm of axilla and upper limb lymph nodes: Secondary | ICD-10-CM

## 2013-07-16 DIAGNOSIS — I4891 Unspecified atrial fibrillation: Secondary | ICD-10-CM

## 2013-07-16 DIAGNOSIS — M810 Age-related osteoporosis without current pathological fracture: Secondary | ICD-10-CM

## 2013-07-16 NOTE — Telephone Encounter (Signed)
gave pt appt for lab and MD for july, pt is now a pt of Dr. Alvy Bimler per Dr Juliann Mule and Dr Alvy Bimler is aware

## 2013-07-16 NOTE — Progress Notes (Signed)
[facility] OFFICE PROGRESS NOTE  SHAW,KIMBERLEE, MD [address] [place] [address]  DIAGNOSIS: Malignant neoplasm of breast (female), unspecified site  History of radiation therapy  Sarcoma  Soft tissue sarcoma of chest wall  Chief Complaint  Patient presents with  . 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 Pathologic stage pT2 cN0 M0. She had another recurrence outside of the previous field of radiation; and had resection on February 16, 2010 with same pathology but positive margin. She is also s/p adjuvant XRT for each time she had resection. She was started on 06/26/10 with adjuvant chemotherapy with continuous infusion of Adriamycin/Cytoxan over 7 days q4 wks. There was plan for at least 5 cycles. However, she developed recurrent mucositis, worsening performance status, and unrelated problem of severe back pain from degenerative joint disease. Therefore, adjuvant chemo was discontinued after two cycles. She had been on watchful observation until she developed local recurrent disease in August 2012. She underwent repeat resection on December 27, 2010. She was deemed not be be candidate for repeat radiation. She developed recurrence in the right axilla and underwent resection on 08/14/2012 followed by radiation to the right axilla started on 09/23/2012 completed on 11/02/2012.   CURRENT THERAPY: Observation  INTERVAL HISTORY:  Veronica Ellison 80 y.o. female returns for regular follow up with her daughter Lattie Haw. She reports being stable overall.  She does have a dull ache underneath her right axillary area.  She lives with her daughter. She is independent of activities of daily living. She has chronic left chest wall pain from recurrent sarcoma.   Her weight is stable.  When last seen by surgery and radiation oncology, they advised on continued observation because she was not a candidate for further surgery/radiation therapy. She takes prn percocet for this and chronic low back pain.    MEDICAL HISTORY: Past Medical History  Diagnosis Date  . Breast cancer 1996    (Lt) breast ca dx 1996  . 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 Hyperlipidemia   . Arthritis     osteoarthritis  . IBD (inflammatory bowel disease)   . Gallstones   . Fracture of left hand   . Stroke     "mild stroke in 2012"  . Vertigo     hx of  . Pneumonia     hx of  . Anemia     hx of  . Blood transfusion     "in 1999"  . Urinary tract infection     hx of  . GERD (gastroesophageal reflux disease)   . Headache(784.0)     "migraines hx of"  . Degenerative disc disease     hx of  . Hx of radiation therapy 07/2009 -08/2009, 04/2010- 05/2010    right chest wall  . History of radiation therapy 09/23/11-11/04/11    right paraspinal chest wall 560Gy   . A-fib   . Chronic back pain   . Wears glasses   . HOH (hard of hearing)   . Peripheral vascular disease     neuropathy in feet per patient  . History of radiation therapy 09/23/12-11/02/12    right axilla, 5000 cGy 25 sessions, boost 600 cGy 3 sessions  . Hypertension   .  MVP (mitral valve prolapse)     INTERIM HISTORY: has Soft tissue sarcoma of chest wall; Sarcoma; Breast cancer; Atrial fibrillation; Chest pain; History of radiation therapy; Essential hypertension, benign; Allergic rhinitis due to pollen; Mixed hyperlipidemia; Abnormality of gait; Major depressive disorder, single episode, unspecified; Other B-complex deficiencies; Malignant neoplasm of breast (female), unspecified site; and Undiagnosed cardiac murmurs on her problem list.    ALLERGIES:  is allergic to other; amoxicillin; chocolate; demerol; peanut-containing drug products; adhesive; ampicillin; meperidine and related;  morphine and related; nitrofurantoin; sulfa antibiotics; and tetracyclines & related.  MEDICATIONS: has a current medication list which includes the following prescription(s): alendronate, amiodarone, aspirin, calcium carbonate-vitamin d, docusate sodium, multivitamins with iron, nitroglycerin, omeprazole, ondansetron, oxycodone-acetaminophen, oxycontin, restasis, sertraline, and wound cleansers.  SURGICAL HISTORY:  Past Surgical History  Procedure Laterality Date  . Mastectomy  1996  . Abdominal hysterectomy  1999  . Bowel resection  1999  . Cholecystectomy  2005  . 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 Sarcoma excision  12/27/10    rt axillary and back  . Eye surgery      bilateral cataract surgery  . Kyphosis surgery       x 3  . Mass excision  08/12/2011    sarcoma-Procedure: EXCISION MASS;  Surgeon: Zenovia Jarred, MD;  Location: [facility];  Service: General;  Laterality: Right;  excision mass right paraspinous area, excision mass right chest wall  . Mass excision Right 08/14/2012    Procedure: EXCISION MASS RIGHT AXIULA and right back;  Surgeon: Zenovia Jarred, MD;  Location: [facility];  Service: General;  Laterality: Right;    REVIEW OF SYSTEMS:   Constitutional: Denies fevers, chills or abnormal weight loss; Positive for chronic back pain.  Eyes: Denies blurriness of vision Ears, nose, mouth, throat, and face: Denies mucositis or sore throat Respiratory: Denies cough, dyspnea or wheezes Cardiovascular: Denies palpitation, chest discomfort or lower extremity swelling Gastrointestinal:  Denies nausea, heartburn or change in bowel habits Skin: Reports one tender swelling on the posterior back Lymphatics: Denies new lymphadenopathy or easy bruising Neurological:Denies numbness, tingling or new weaknesses Behavioral/Psych: Mood is stable, no new changes  All other systems were reviewed with the patient and are  negative.  PHYSICAL EXAMINATION: ECOG PERFORMANCE STATUS: 0 - Asymptomatic  Blood pressure 124/55, pulse 67, temperature 97.9 F (36.6 C), resp. rate 19, height 5' 1.5" (1.562 m), weight 107 lb 11.2 oz (48.852 kg).  GENERAL:Thin-appearing woman, alert, no distress and comfortable; slightly kyphotic. SKIN: skin color, texture, turgor are normal, no rashes or significant lesions; scar in the posterior left chest wall, hyperkaratotic; small 3 x 2 cm swelling mildly ttp over the right upper posterior back. EYES: normal, Conjunctiva are pink and non-injected, sclera clear OROPHARYNX:no exudate, no erythema and lips, buccal mucosa, and tongue normal  NECK: supple, thyroid normal size, non-tender, without nodularity LYMPH:  no palpable lymphadenopathy in the cervical, axillary or supraclavicular LUNGS: clear to auscultation and percussion with normal breathing effort HEART: regular rate & rhythm and no murmurs and no lower extremity edema ABDOMEN:abdomen soft, non-tender and normal bowel sounds Musculoskeletal:no cyanosis of digits and no clubbing  NEURO: alert & oriented x 3 with fluent speech, no focal motor/sensory deficits  Labs:  Lab Results  Component Value Date   WBC 6.6 05/18/2013   HGB 11.9 05/18/2013   HCT 38.1 05/18/2013   MCV 100.0 05/18/2013   PLT 219 05/18/2013  NEUTROABS 5.1 05/18/2013      Chemistry      Component Value Date/Time   NA 140 05/18/2013 1209   NA 140 09/30/2012 1020   K 4.2 05/18/2013 1209   K 3.9 09/30/2012 1020   CL 105 09/30/2012 1020   CL 103 09/28/2012 1517   CO2 28 05/18/2013 1209   CO2 28 09/30/2012 1020   BUN 21.5 05/18/2013 1209   BUN 19 09/30/2012 1020   CREATININE 0.9 05/18/2013 1209   CREATININE 0.81 09/30/2012 1020      Component Value Date/Time   CALCIUM 9.4 05/18/2013 1209   CALCIUM 8.8 09/30/2012 1020   ALKPHOS 80 05/18/2013 1209   ALKPHOS 80 11/29/2011 1036   AST 19 05/18/2013 1209   AST 18 11/29/2011 1036   ALT 15 05/18/2013 1209   ALT 13  11/29/2011 1036   BILITOT 0.44 05/18/2013 1209   BILITOT 0.3 11/29/2011 1036      RADIOGRAPHIC STUDIES: No results found.  ASSESSMENT: Veronica Ellison 80 y.o. female with a history of Malignant neoplasm of breast (female), unspecified site  History of radiation therapy  Sarcoma  Soft tissue sarcoma of chest wall  PLAN:  1. History of recurrent sarcoma. She had a local recurrence to the right axilla in May 2014. Status post excision and XRT.  Her labs were reviewed in detail and they were all in normal limits. posterior back lesion concerning for recurrent sarcoma.  It was about the size of quarter and about size of dime three month ago.      2. Osteoarthritis, degenerative joint disease, and sacral compression fracture. She is on pain med prn.   3. Atrial fibrillation. She is on amiodarone. She is off of Coumadin now due to risk of bleeding.  5. History of osteoporosis. She is on calcium/Vit D/alendronate per PCP.  6.  Follow up: in about 3 months.   All questions were answered. The patient knows to call the clinic with any problems, questions or concerns. We can certainly see the patient much sooner if necessary.  I spent 15 minutes counseling the patient face to face. The total time spent in the appointment was 25 minutes.    Concha Norway, MD 07/16/2013 1:44 PM

## 2013-08-18 ENCOUNTER — Ambulatory Visit (INDEPENDENT_AMBULATORY_CARE_PROVIDER_SITE_OTHER): Payer: Medicare PPO | Admitting: General Surgery

## 2013-08-18 ENCOUNTER — Ambulatory Visit
Admission: RE | Admit: 2013-08-18 | Discharge: 2013-08-18 | Disposition: A | Discharge status: Home / Self Care | Payer: Medicare PPO | Source: Ambulatory Visit | Attending: General Surgery | Admitting: General Surgery

## 2013-08-18 ENCOUNTER — Encounter (INDEPENDENT_AMBULATORY_CARE_PROVIDER_SITE_OTHER): Payer: Self-pay | Admitting: General Surgery

## 2013-08-18 VITALS — BP 138/78 | HR 72 | Temp 97.6°F | Resp 14 | Wt 107.5 lb

## 2013-08-18 DIAGNOSIS — R0602 Shortness of breath: Secondary | ICD-10-CM

## 2013-08-18 DIAGNOSIS — C499 Malignant neoplasm of connective and soft tissue, unspecified: Secondary | ICD-10-CM

## 2013-08-18 NOTE — Progress Notes (Signed)
Subjective:     Patient ID: Veronica Ellison, female   DOB: [birth year], 80 y.o.   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 She feels the area in her axilla is larger. It occasionally hurts her. Her chronic pain regimen controls things. She really has some shortness of breath as well which seems new to her. She has not noted exacerbating factors for that.  Review of Systems     Objective:   Physical Exam  Constitutional: She appears well-developed.  HENT:  Head: Normocephalic.  Neck: Neck supple.  Cardiovascular:  Irregularly irregular  Pulmonary/Chest: Effort normal and breath sounds normal.  Abdominal: Soft. She exhibits no distension. There is no tenderness.  Musculoskeletal:  Large right axillary mass is irregular and mildly tender, extends to right latissimus, larger than previous exam. She also has a vague fullness superior to her original excision scar. Areas along her spine are smooth but mildly tender       Assessment:     Right chest wall sarcoma with local recurrence and regional metastasis gradually enlarging, no skin breakdown, shortness of breath    Plan:     Unfortunately we do not have options for further treatment of her sarcoma. I will send her for chest x-ray for further evaluation of his shortness of breath. Her pain regimen seems to be working. I will see her back in 2 months. I will call her with the chest x-ray results.

## 2013-08-19 ENCOUNTER — Telehealth (INDEPENDENT_AMBULATORY_CARE_PROVIDER_SITE_OTHER): Payer: Self-pay | Admitting: General Surgery

## 2013-08-19 NOTE — Telephone Encounter (Signed)
Left VM about CXR report

## 2013-09-07 ENCOUNTER — Encounter: Payer: Self-pay | Admitting: Radiation Oncology

## 2013-09-07 ENCOUNTER — Ambulatory Visit
Admission: RE | Admit: 2013-09-07 | Discharge: 2013-09-07 | Disposition: A | Discharge status: Home / Self Care | Payer: Medicare PPO | Source: Ambulatory Visit | Attending: Radiation Oncology | Admitting: Radiation Oncology

## 2013-09-07 VITALS — BP 166/66 | HR 78 | Temp 97.6°F | Resp 20 | Wt 109.6 lb

## 2013-09-07 DIAGNOSIS — C493 Malignant neoplasm of connective and soft tissue of thorax: Secondary | ICD-10-CM

## 2013-09-07 NOTE — Progress Notes (Signed)
Pt states she has pain in her right axilla and around to her back. She also has back pain which is chronic, s/p kyphoplastys. She takes Oxycontin 10 mg nightly and Oxycodone 1 tab 2-3 times a day. She states her appetite is good, energy "comes and goes".

## 2013-09-07 NOTE — Progress Notes (Signed)
Followup note:  Veronica Ellison returns today approximately 10 months following completion of additional radiation therapy to her right axilla in the management of her recurrent soft tissue sarcoma involving her right axilla/chest wall. She continues with OxyContin and Percocet for chronic low back pain through the supervision of Dr. Serita Grammes. She reports more "soreness" along her right axilla particularly with right shoulder extension. Her pain ranges from 1-5/10.  A chest x-ray on 08/18/2013 showed a nodular opacity in the peripheral upper lobe field that maybe bony in origin but a lung nodule could not be excluded. She continues to live with her daughter. She sees Dr. Alvy Bimler on July 13. She sees Dr. Grandville Silos on July 8.  Physical examination: Alert and oriented. Filed Vitals:   09/07/13 1504  BP: 166/66  Pulse: 78  Temp: 97.6 F (36.4 C)  Resp: 20   Head and neck examination: Grossly unremarkable. Nodes: There is no palpable cervical or supraclavicular lymphadenopathy. Chest: There is bilobed mass/induration along the posterior right axilla/chest wall representing sarcoma progression, and some degree of radiation fibrosis. The mass is now essentially fixed to her chest wall/axilla. This extends over an area of at least 12-13 cm. On inspection of her back the previously noted nodule along the medial aspect of her scar is essentially unchanged. Lungs are clear. Extremities: There is no lymphedema. There is limited extension of her right shoulder beyond 90 secondary to pain. Neurovascular intact.  Impression: She has obvious disease progression along her right axilla/chest wall. I would continue to offer her comfort care. If her tumor ulcerates his scan then we could give superficial electron beam radiation therapy to hopefully delay progression.  Plan: Followup visit with me in approximately 4 months. She knows to call me sooner if she develops skin ulceration. I do not see any point in pursuing  her questionable nodular opacity seen on her recent chest x-ray.

## 2013-10-13 ENCOUNTER — Ambulatory Visit (INDEPENDENT_AMBULATORY_CARE_PROVIDER_SITE_OTHER): Payer: Medicare PPO | Admitting: General Surgery

## 2013-10-13 ENCOUNTER — Encounter (INDEPENDENT_AMBULATORY_CARE_PROVIDER_SITE_OTHER): Payer: Self-pay | Admitting: General Surgery

## 2013-10-13 VITALS — BP 142/78 | HR 76 | Resp 16 | Ht 61.0 in | Wt 109.0 lb

## 2013-10-13 DIAGNOSIS — C493 Malignant neoplasm of connective and soft tissue of thorax: Secondary | ICD-10-CM

## 2013-10-13 MED ORDER — ONDANSETRON HCL 4 MG PO TABS: 4.0000 mg | ORAL_TABLET | Freq: Three times a day (TID) | ORAL | Status: AC | PRN

## 2013-10-13 NOTE — Progress Notes (Signed)
Subjective:     Patient ID: Veronica Ellison, female   DOB: 1922-03-02, 80 y.o.   MRN: [id number]  HPI Patient is following up for right chest wall sarcoma with local and regional recurrence. The area under her right arm and towards her back has gotten bigger. She is also having some pain along her spine. Additionally, she is having some intermittent nausea. She also has heartburn but is taking Prilosec daily.  Review of Systems     Objective:   Physical Exam  Constitutional: She is oriented to person, place, and time. She appears well-developed.  HENT:  Head: Normocephalic.  Cardiovascular:  irregular  Pulmonary/Chest:      Bilobed 14 cm mass extending from right axilla out over the latissimus with localized tenderness, skin is intact overlying. 2 cm nodule right paraspinous area is also tender. Old scar along lateral and anterior chest wall appears stable  Musculoskeletal:  Right shoulder range of motion limited to 90 by pain  Neurological: She is alert and oriented to person, place, and time.       Assessment:     Right chest wall sarcoma with local and regional recurrence and progression. Noted recent visit with Dr. Valere Dross. He is offered radiation therapy if her skin breaks down. So far it remains intact.I agree with his assessment that we should focus on palliation. No need to further investigate possible lung mass.    Plan:     Zofran when necessary for nausea. She has an oncology appointment next week with Dr. Alvy Bimler. I will see her back in September.

## 2013-10-18 ENCOUNTER — Encounter: Payer: Self-pay | Admitting: Hematology and Oncology

## 2013-10-18 ENCOUNTER — Other Ambulatory Visit (HOSPITAL_BASED_OUTPATIENT_CLINIC_OR_DEPARTMENT_OTHER): Payer: Medicare PPO

## 2013-10-18 ENCOUNTER — Ambulatory Visit (HOSPITAL_BASED_OUTPATIENT_CLINIC_OR_DEPARTMENT_OTHER): Payer: Medicare PPO | Admitting: Hematology and Oncology

## 2013-10-18 VITALS — BP 121/64 | HR 68 | Temp 97.8°F | Resp 18 | Ht 61.0 in | Wt 109.1 lb

## 2013-10-18 DIAGNOSIS — Z853 Personal history of malignant neoplasm of breast: Secondary | ICD-10-CM

## 2013-10-18 DIAGNOSIS — C493 Malignant neoplasm of connective and soft tissue of thorax: Secondary | ICD-10-CM

## 2013-10-18 DIAGNOSIS — R0789 Other chest pain: Secondary | ICD-10-CM

## 2013-10-18 DIAGNOSIS — R071 Chest pain on breathing: Secondary | ICD-10-CM

## 2013-10-18 DIAGNOSIS — D630 Anemia in neoplastic disease: Secondary | ICD-10-CM | POA: Insufficient documentation

## 2013-10-18 DIAGNOSIS — R0989 Other specified symptoms and signs involving the circulatory and respiratory systems: Secondary | ICD-10-CM | POA: Insufficient documentation

## 2013-10-18 LAB — CBC WITH DIFFERENTIAL/PLATELET
BASO%: 0.7 % (ref 0.0–2.0)
Basophils Absolute: 0 10*3/uL (ref 0.0–0.1)
EOS ABS: 0.4 10*3/uL (ref 0.0–0.5)
EOS%: 6.3 % (ref 0.0–7.0)
HCT: 35.1 % (ref 34.8–46.6)
HEMOGLOBIN: 11.2 g/dL — AB (ref 11.6–15.9)
LYMPH%: 15 % (ref 14.0–49.7)
MCH: 31.5 pg (ref 25.1–34.0)
MCHC: 31.9 g/dL (ref 31.5–36.0)
MCV: 98.7 fL (ref 79.5–101.0)
MONO#: 0.7 10*3/uL (ref 0.1–0.9)
MONO%: 10.9 % (ref 0.0–14.0)
NEUT%: 67.1 % (ref 38.4–76.8)
NEUTROS ABS: 4.6 10*3/uL (ref 1.5–6.5)
PLATELETS: 226 10*3/uL (ref 145–400)
RBC: 3.56 10*6/uL — AB (ref 3.70–5.45)
RDW: 14.3 % (ref 11.2–14.5)
WBC: 6.8 10*3/uL (ref 3.9–10.3)
lymph#: 1 10*3/uL (ref 0.9–3.3)

## 2013-10-18 LAB — COMPREHENSIVE METABOLIC PANEL (CC13)
ALBUMIN: 3.4 g/dL — AB (ref 3.5–5.0)
ALT: 13 U/L (ref 0–55)
ANION GAP: 9 meq/L (ref 3–11)
AST: 18 U/L (ref 5–34)
Alkaline Phosphatase: 78 U/L (ref 40–150)
BILIRUBIN TOTAL: 0.32 mg/dL (ref 0.20–1.20)
BUN: 24.5 mg/dL (ref 7.0–26.0)
CO2: 26 meq/L (ref 22–29)
Calcium: 9.4 mg/dL (ref 8.4–10.4)
Chloride: 106 mEq/L (ref 98–109)
Creatinine: 1.1 mg/dL (ref 0.6–1.1)
GLUCOSE: 97 mg/dL (ref 70–140)
POTASSIUM: 5.5 meq/L — AB (ref 3.5–5.1)
SODIUM: 141 meq/L (ref 136–145)
TOTAL PROTEIN: 6.8 g/dL (ref 6.4–8.3)

## 2013-10-18 LAB — LACTATE DEHYDROGENASE (CC13): LDH: 206 U/L (ref 125–245)

## 2013-10-18 NOTE — Assessment & Plan Note (Signed)
This is due to local invasion of cancer. Currently, the patient appears to be well controlled with current pain medication regimen, albeit with significant side effects of causing sedation, nausea and constipation.

## 2013-10-18 NOTE — Assessment & Plan Note (Signed)
This is at the right lung base around the field of radiation. Is likely related to radiation fibrosis although she does not have cough. Direct invasion of the lungs or lung metastasis cannot be excluded. At this point in time, I would not ordered imaging study unless she agrees to the palliative chemotherapy.

## 2013-10-18 NOTE — Progress Notes (Signed)
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 female was transferred to my care after her prior physician has left.  I reviewed the patient's records extensive and collaborated the history with the patient. 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 Pathologic stage pT2 cN0 M0. She had another recurrence outside of the previous field of radiation; and had resection on February 16, 2010 with same pathology but positive margin. She is also s/p adjuvant XRT for each time she had resection. She was started on 06/26/10 with adjuvant chemotherapy with continuous infusion of Adriamycin/Cytoxan over 7 days q4 wks. There was plan for at least 5 cycles. However, she developed recurrent mucositis, worsening performance status, and unrelated problem of severe back pain from degenerative joint disease. Therefore, chemo was discontinued after two cycles. She had been on watchful observation until she developed local recurrent disease in August 2012 10 2014. She underwent repeat resection on December 27, 2010. She was treated with repeat radiation treatment in July 2014. Since discontinuation of treatment, she has continuous growth of her chest wall mass causing severe chest wall pain. Currently, she is rating her pain score as 0. She has poor performance status due to excessive sedation from the pain medicine. She denies recent weight loss. Denies recent cough.  MEDICAL HISTORY:  Past Medical History  Diagnosis Date  . Breast cancer 1996    (Lt) breast ca dx 1996  . Sarcoma  2010, 2011    dx 2010 (spindle cell of chest wall)  . Hyperlipidemia   . Arthritis     osteoarthritis  . IBD (inflammatory bowel disease)   . Gallstones   . Fracture of left hand   . Stroke     "mild stroke in 2012"  . Vertigo     hx of  . Pneumonia     hx of  . Anemia     hx of  . Blood transfusion     "in 1999"  . Urinary tract infection     hx of  . GERD (gastroesophageal reflux disease)   . Headache(784.0)     "migraines hx of"  . Degenerative disc disease     hx of  . Hx of radiation therapy 07/2009 -08/2009, 04/2010- 05/2010    right chest wall  . History of radiation therapy 09/23/11-11/04/11    right paraspinal chest wall 560Gy   . A-fib   . Chronic back pain   . Wears glasses   . HOH (hard of hearing)   . Peripheral vascular disease     neuropathy in feet per patient  . History of radiation therapy 09/23/12-11/02/12    right axilla, 5000 cGy 25 sessions, boost 600 cGy 3 sessions  . Hypertension   . MVP (mitral valve prolapse)     SURGICAL HISTORY: Past Surgical History  Procedure Laterality Date  . Mastectomy  1996  . Abdominal hysterectomy  1999  . Bowel resection  1999  . Cholecystectomy  2005  . 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 Sarcoma excision  12/27/10    rt axillary and back  . Eye surgery      bilateral cataract surgery  . Kyphosis surgery       x 3  . Mass excision  08/12/2011    sarcoma-Procedure: EXCISION MASS;  Surgeon: Zenovia Jarred, MD;  Location: [facility];  Service: General;  Laterality: Right;  excision mass right paraspinous area, excision mass right chest wall  . Mass excision Right 08/14/2012    Procedure: EXCISION MASS RIGHT AXIULA and right back;  Surgeon: Zenovia Jarred, MD;  Location: [facility];  Service: General;  Laterality: Right;    SOCIAL HISTORY: History   Social History  . Marital Status: Married    Spouse Name: N/A    Number of Children: N/A  . Years  of Education: N/A   Occupational History  . Not on file.   Social History Main Topics  . Smoking status: Never Smoker   . Smokeless tobacco: Never Used  . Alcohol Use: No  . Drug Use: No  . Sexual Activity: Not on file   Other Topics Concern  . Not on file   Social History Narrative  . No narrative on file    FAMILY HISTORY: Family History  Problem Relation Age of Onset  . Heart disease Mother   . Diabetes Mother   . Heart disease Father   . Kidney disease Father   . Hypertension Father   . Heart disease Sister   . Cancer Brother     prostate  . Heart disease Brother   . Cancer Son     pancreatic    ALLERGIES:  is allergic to other; amoxicillin; chocolate; demerol; peanut-containing drug products; adhesive; ampicillin; meperidine and related; morphine and related; nitrofurantoin; sulfa antibiotics; and tetracyclines & related.  MEDICATIONS:  Current Outpatient Prescriptions  Medication Sig Dispense Refill  . alendronate (FOSAMAX) 70 MG tablet Take 70 mg by mouth every 7 (seven) days. Take with a full glass of water on an empty stomach.      Marland Kitchen amiodarone (PACERONE) 200 MG tablet TAKE 1 TABLET (200 MG TOTAL) BY MOUTH EVERY MORNING.  30 tablet  5  . aspirin 325 MG tablet Take 325 mg by mouth every evening.       . Calcium Carbonate-Vitamin D (CALTRATE 600+D PO) Take 1 tablet by mouth 2 (two) times daily.       Marland Kitchen docusate sodium (COLACE) 100 MG capsule Take 300 mg by mouth every evening.       . Multiple Vitamins-Iron (MULTIVITAMINS WITH IRON) TABS Take 1 tablet by mouth every morning.       . nitroGLYCERIN (NITROSTAT) 0.4 MG SL tablet Place 1 tablet (0.4 mg total) under the tongue every 5 (five) minutes as needed. For Chest pain  25 tablet  5  . omeprazole (PRILOSEC) 20 MG capsule Take 20 mg by mouth every morning.       . ondansetron (ZOFRAN) 4 MG tablet Take 1 tablet (4 mg total) by mouth every 8 (eight) hours as needed for nausea.  20 tablet  1  .  oxyCODONE-acetaminophen (PERCOCET) 7.5-325 MG per tablet Take 1 tablet by mouth every 6 (six) hours as needed for pain.  40 tablet  0  . OXYCONTIN 10 MG T12A 10 mg daily.       . RESTASIS 0.05 % ophthalmic emulsion Place 1 drop into both eyes daily.       . sertraline (ZOLOFT) 50 MG tablet Take 50 mg  by mouth every evening.        No current facility-administered medications for this visit.    REVIEW OF SYSTEMS:   Constitutional: Denies fevers, chills or abnormal night sweats Eyes: Denies blurriness of vision, double vision or watery eyes Ears, nose, mouth, throat, and face: Denies mucositis or sore throat Respiratory: Denies cough, dyspnea or wheezes Cardiovascular: Denies palpitation, chest discomfort or lower extremity swelling Skin: Denies abnormal skin rashes Lymphatics: Denies new lymphadenopathy or easy bruising Neurological:Denies numbness, tingling or new weaknesses Behavioral/Psych: Mood is stable, no new changes  All other systems were reviewed with the patient and are negative.  PHYSICAL EXAMINATION: ECOG PERFORMANCE STATUS: 2 - Symptomatic, <50% confined to bed  Filed Vitals:   10/18/13 1318  BP: 121/64  Pulse: 68  Temp: 97.8 F (36.6 C)  Resp: 18   Filed Weights   10/18/13 1318  Weight: 109 lb 1.6 oz (49.487 kg)    GENERAL:alert, no distress and comfortable. She looks thin SKIN: skin color, texture, turgor are normal, no rashes or significant lesions EYES: normal, conjunctiva are pink and non-injected, sclera clear OROPHARYNX:no exudate, normal lips, buccal mucosa, and tongue  NECK: supple, thyroid normal size, non-tender, without nodularity LYMPH:  no palpable lymphadenopathy in the cervical, axillary or inguinal LUNGS: Normal breathing at the period crackles audible in the right lung base HEART: regular rate & rhythm with a soft systolic murmurs without lower extremity edema ABDOMEN:abdomen soft, non-tender and normal bowel sounds Musculoskeletal:no  cyanosis of digits and no clubbing  PSYCH: alert & oriented x 3 with fluent speech NEURO: no focal motor/sensory deficits Left chest wall examination shows well-healed mastectomy scar with no abnormalities. On the right chest wall, there is a large tumor extending from the side of her rib cage towards the axilla, nontender to palpation. LABORATORY DATA:  I have reviewed the data as listed Lab Results  Component Value Date   WBC 6.8 10/18/2013   HGB 11.2* 10/18/2013   HCT 35.1 10/18/2013   MCV 98.7 10/18/2013   PLT 226 10/18/2013    Recent Labs  01/28/13 1234 05/18/13 1209 10/18/13 1249  NA 141 140 141  K 4.6 4.2 5.5*  CO2 27 28 26   GLUCOSE 106 98 97  BUN 20.8 21.5 24.5  CREATININE 0.8 0.9 1.1  CALCIUM 9.5 9.4 9.4  PROT 7.0 6.8 6.8  ALBUMIN 3.6 3.8 3.4*  AST 21 19 18   ALT 13 15 13   ALKPHOS 82 80 78  BILITOT 0.42 0.44 0.32   ASSESSMENT & PLAN:  History of left breast cancer Clinically, she has no recurrence on the left chest wall had previous mastectomy site. Recommend observation only.  Soft tissue sarcoma of chest wall She had numerous surgical biopsies which confirmed the same diagnosis and was treated with chemotherapy, radiation and multiple surgical resections. No further local treatment is available. She is not a candidate for further surgery or radiation. The mass is growing on her chest wall and is causing pain. I discussed with her the risk and benefits of pursuing palliative chemotherapy with Pazopanib versus hospice care. I gave her patient education handout about the use of Pazopanib and side effects to be expected. I have not make a return appointment for the patient to come back. If she is interested to pursue palliative chemotherapy, I would recommend repeat staging CT scans as baseline and I will bring her back to consent her for chemotherapy and order additional workup.  Chest wall pain This is due to local invasion  of cancer. Currently, the patient  appears to be well controlled with current pain medication regimen, albeit with significant side effects of causing sedation, nausea and constipation.  Anemia in neoplastic disease This is likely anemia of chronic disease. The patient denies recent history of bleeding such as epistaxis, hematuria or hematochezia. She is asymptomatic from the anemia. We will observe for now.    Lung crackles This is at the right lung base around the field of radiation. Is likely related to radiation fibrosis although she does not have cough. Direct invasion of the lungs or lung metastasis cannot be excluded. At this point in time, I would not ordered imaging study unless she agrees to the palliative chemotherapy.     All questions were answered. The patient knows to call the clinic with any problems, questions or concerns. I spent 40 minutes counseling the patient face to face. The total time spent in the appointment was 55 minutes and more than 50% was on counseling.     [facility], Jayelyn Barno, MD 10/18/2013 10:23 PM

## 2013-10-18 NOTE — Assessment & Plan Note (Signed)
She had numerous surgical biopsies which confirmed the same diagnosis and was treated with chemotherapy, radiation and multiple surgical resections. No further local treatment is available. She is not a candidate for further surgery or radiation. The mass is growing on her chest wall and is causing pain. I discussed with her the risk and benefits of pursuing palliative chemotherapy with Pazopanib versus hospice care. I gave her patient education handout about the use of Pazopanib and side effects to be expected. I have not make a return appointment for the patient to come back. If she is interested to pursue palliative chemotherapy, I would recommend repeat staging CT scans as baseline and I will bring her back to consent her for chemotherapy and order additional workup.

## 2013-10-18 NOTE — Assessment & Plan Note (Signed)
This is likely anemia of chronic disease. The patient denies recent history of bleeding such as epistaxis, hematuria or hematochezia. She is asymptomatic from the anemia. We will observe for now.  

## 2013-10-18 NOTE — Assessment & Plan Note (Signed)
Clinically, she has no recurrence on the left chest wall had previous mastectomy site. Recommend observation only.

## 2013-11-05 ENCOUNTER — Telehealth: Payer: Self-pay | Admitting: Hematology and Oncology

## 2013-11-05 NOTE — Telephone Encounter (Signed)
lvm regarding to sched appt per pt request....advised to call back if needed to r/s

## 2013-11-08 ENCOUNTER — Telehealth: Payer: Self-pay | Admitting: Hematology and Oncology

## 2013-11-08 NOTE — Telephone Encounter (Signed)
lvm for pt regarding to Aug appt....lab not needed per MD staff

## 2013-11-23 ENCOUNTER — Other Ambulatory Visit: Payer: Medicare PPO

## 2013-11-23 ENCOUNTER — Encounter: Payer: Self-pay | Admitting: Hematology and Oncology

## 2013-11-23 ENCOUNTER — Ambulatory Visit (HOSPITAL_BASED_OUTPATIENT_CLINIC_OR_DEPARTMENT_OTHER): Payer: Medicare PPO | Admitting: Hematology and Oncology

## 2013-11-23 VITALS — BP 137/59 | HR 73 | Temp 98.3°F | Resp 18 | Ht 61.0 in | Wt 110.6 lb

## 2013-11-23 DIAGNOSIS — C493 Malignant neoplasm of connective and soft tissue of thorax: Secondary | ICD-10-CM

## 2013-11-23 DIAGNOSIS — R0789 Other chest pain: Secondary | ICD-10-CM

## 2013-11-23 DIAGNOSIS — G893 Neoplasm related pain (acute) (chronic): Secondary | ICD-10-CM

## 2013-11-23 NOTE — Progress Notes (Signed)
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 Pathologic stage pT2 cN0 M0. She had another recurrence outside of the previous field of radiation; and had resection on February 16, 2010 with same pathology but positive margin. She is also s/p adjuvant XRT for each time she had resection. She was started on 06/26/10 with adjuvant chemotherapy with continuous infusion of Adriamycin/Cytoxan over 7 days q4 wks. There was plan for at least 5 cycles. However, she developed recurrent mucositis, worsening performance status, and unrelated problem of severe back pain from degenerative joint disease. Therefore, chemo was discontinued after two cycles. She had been on watchful observation until she developed local recurrent disease in August 2012 10 2014. She underwent repeat resection on December 27, 2010. She was treated with repeat radiation treatment in July 2014. Since discontinuation of treatment, she has continuous growth of her chest wall mass causing severe chest wall pain.  INTERVAL HISTORY: Please see below for problem oriented charting. She returns today to discuss about the use of palliative chemotherapy. Multiple family members are present. She felt that the mass has grown larger and is causing mild worsening pain. She has been prescribed some form of topical pain patch and it does not seem to control her pain well. She has excessive sedation during daytime from her current pain regimen.  REVIEW OF SYSTEMS:   Constitutional: Denies fevers, chills or abnormal weight loss Eyes: Denies blurriness of vision Ears, nose, mouth, throat, and face: Denies mucositis or sore  throat Respiratory: Denies cough, dyspnea or wheezes Cardiovascular: Denies palpitation, chest discomfort or lower extremity swelling Gastrointestinal:  Denies nausea, heartburn or change in bowel habits Skin: Denies abnormal skin rashes Lymphatics: Denies new lymphadenopathy or easy bruising Neurological:Denies numbness, tingling or new weaknesses Behavioral/Psych: Mood is stable, no new changes  All other systems were reviewed with the patient and are negative.  I have reviewed the past medical history, past surgical history, social history and family history with the patient and they are unchanged from previous note.  ALLERGIES:  is allergic to other; amoxicillin; chocolate; demerol; peanut-containing drug products; adhesive; ampicillin; meperidine and related; morphine and related; nitrofurantoin; sulfa antibiotics; and tetracyclines & related.  MEDICATIONS:  Current Outpatient Prescriptions  Medication Sig Dispense Refill  . alendronate (FOSAMAX) 70 MG tablet Take 70 mg by mouth every 7 (seven) days. Take with a full glass of water on an empty stomach.      Marland Kitchen amiodarone (PACERONE) 200 MG tablet TAKE 1 TABLET (200 MG TOTAL) BY MOUTH EVERY MORNING.  30 tablet  5  . aspirin 325 MG tablet Take 325 mg by mouth every evening.       . Calcium Carbonate-Vitamin D (CALTRATE 600+D PO) Take 1 tablet by mouth 2 (two) times daily.       Marland Kitchen docusate sodium (COLACE) 100 MG capsule Take 300 mg by mouth every evening.       . lidocaine (LIDODERM) 5 % Place onto the skin daily as needed.      . Multiple Vitamins-Iron (MULTIVITAMINS WITH IRON) TABS Take 1 tablet by mouth every morning.       . nitroGLYCERIN (NITROSTAT) 0.4 MG SL tablet Place 1 tablet (0.4 mg total) under the  tongue every 5 (five) minutes as needed. For Chest pain  25 tablet  5  . omeprazole (PRILOSEC) 20 MG capsule Take 20 mg by mouth every morning.       . ondansetron (ZOFRAN) 4 MG tablet Take 1 tablet (4 mg total) by mouth every 8  (eight) hours as needed for nausea.  20 tablet  1  . oxyCODONE-acetaminophen (PERCOCET) 7.5-325 MG per tablet Take 1 tablet by mouth every 6 (six) hours as needed for pain.  40 tablet  0  . OXYCONTIN 10 MG T12A 10 mg daily.       . RESTASIS 0.05 % ophthalmic emulsion Place 1 drop into both eyes daily.       . sertraline (ZOLOFT) 50 MG tablet Take 50 mg by mouth every evening.        No current facility-administered medications for this visit.    PHYSICAL EXAMINATION: ECOG PERFORMANCE STATUS: 1 - Symptomatic but completely ambulatory  Filed Vitals:   11/23/13 1202  BP: 137/59  Pulse: 73  Temp: 98.3 F (36.8 C)  Resp: 18   Filed Weights   11/23/13 1202  Weight: 110 lb 9.6 oz (50.168 kg)    GENERAL:alert, no distress and comfortable. She looks thin but not cachectic SKIN: skin color, texture, turgor are normal, no rashes or significant lesions EYES: normal, Conjunctiva are pink and non-injected, sclera clear Musculoskeletal:no cyanosis of digits and no clubbing  NEURO: alert & oriented x 3 with fluent speech, no focal motor/sensory deficits  LABORATORY DATA:  I have reviewed the data as listed    Component Value Date/Time   NA 141 10/18/2013 1249   NA 140 09/30/2012 1020   K 5.5* 10/18/2013 1249   K 3.9 09/30/2012 1020   CL 105 09/30/2012 1020   CL 103 09/28/2012 1517   CO2 26 10/18/2013 1249   CO2 28 09/30/2012 1020   GLUCOSE 97 10/18/2013 1249   GLUCOSE 108* 09/30/2012 1020   GLUCOSE 79 09/28/2012 1517   BUN 24.5 10/18/2013 1249   BUN 19 09/30/2012 1020   CREATININE 1.1 10/18/2013 1249   CREATININE 0.81 09/30/2012 1020   CALCIUM 9.4 10/18/2013 1249   CALCIUM 8.8 09/30/2012 1020   PROT 6.8 10/18/2013 1249   PROT 6.3 11/29/2011 1036   ALBUMIN 3.4* 10/18/2013 1249   ALBUMIN 3.8 11/29/2011 1036   AST 18 10/18/2013 1249   AST 18 11/29/2011 1036   ALT 13 10/18/2013 1249   ALT 13 11/29/2011 1036   ALKPHOS 78 10/18/2013 1249   ALKPHOS 80 11/29/2011 1036   BILITOT 0.32 10/18/2013 1249    BILITOT 0.3 11/29/2011 1036   GFRNONAA 63* 09/30/2012 1020   GFRAA 72* 09/30/2012 1020    No results found for this basename: SPEP,  UPEP,   kappa and lambda light chains    Lab Results  Component Value Date   WBC 6.8 10/18/2013   NEUTROABS 4.6 10/18/2013   HGB 11.2* 10/18/2013   HCT 35.1 10/18/2013   MCV 98.7 10/18/2013   PLT 226 10/18/2013      Chemistry      Component Value Date/Time   NA 141 10/18/2013 1249   NA 140 09/30/2012 1020   K 5.5* 10/18/2013 1249   K 3.9 09/30/2012 1020   CL 105 09/30/2012 1020   CL 103 09/28/2012 1517   CO2 26 10/18/2013 1249   CO2 28 09/30/2012 1020   BUN 24.5 10/18/2013 1249   BUN 19 09/30/2012 1020   CREATININE 1.1 10/18/2013 1249  CREATININE 0.81 09/30/2012 1020      Component Value Date/Time   CALCIUM 9.4 10/18/2013 1249   CALCIUM 8.8 09/30/2012 1020   ALKPHOS 78 10/18/2013 1249   ALKPHOS 80 11/29/2011 1036   AST 18 10/18/2013 1249   AST 18 11/29/2011 1036   ALT 13 10/18/2013 1249   ALT 13 11/29/2011 1036   BILITOT 0.32 10/18/2013 1249   BILITOT 0.3 11/29/2011 1036     ASSESSMENT & PLAN:  Soft tissue sarcoma of chest wall She had numerous surgical biopsies which confirmed the same diagnosis and was treated with chemotherapy, radiation and multiple surgical resections. No further local treatment is available. She is not a candidate for further surgery or radiation. The mass is growing on her chest wall and is causing pain. I discussed with her the risk and benefits of pursuing palliative chemotherapy with Pazopanib versus hospice care. I addressed all her questions regarding the use of Pazopanib and side effects to be expected. I have not make a return appointment for the patient to come back. If she is interested to pursue palliative chemotherapy, I would recommend repeat staging CT scans as baseline and I will bring her back to consent her for chemotherapy and order additional workup.    Chest wall pain This is due to local invasion of  cancer. Currently, the patient appears to be well controlled with current pain medication regimen, albeit with significant side effects of causing sedation, nausea and constipation.      No orders of the defined types were placed in this encounter.   All questions were answered. The patient knows to call the clinic with any problems, questions or concerns. No barriers to learning was detected. I spent 25 minutes counseling the patient face to face. The total time spent in the appointment was 30 minutes and more than 50% was on counseling and review of test results     [facility], [place], MD 11/23/2013 8:02 PM

## 2013-11-23 NOTE — Assessment & Plan Note (Signed)
She had numerous surgical biopsies which confirmed the same diagnosis and was treated with chemotherapy, radiation and multiple surgical resections. No further local treatment is available. She is not a candidate for further surgery or radiation. The mass is growing on her chest wall and is causing pain. I discussed with her the risk and benefits of pursuing palliative chemotherapy with Pazopanib versus hospice care. I addressed all her questions regarding the use of Pazopanib and side effects to be expected. I have not make a return appointment for the patient to come back. If she is interested to pursue palliative chemotherapy, I would recommend repeat staging CT scans as baseline and I will bring her back to consent her for chemotherapy and order additional workup.

## 2013-11-23 NOTE — Assessment & Plan Note (Signed)
This is due to local invasion of cancer. Currently, the patient appears to be well controlled with current pain medication regimen, albeit with significant side effects of causing sedation, nausea and constipation.

## 2013-12-06 ENCOUNTER — Other Ambulatory Visit: Payer: Self-pay | Admitting: Hematology and Oncology

## 2013-12-06 DIAGNOSIS — C493 Malignant neoplasm of connective and soft tissue of thorax: Secondary | ICD-10-CM

## 2013-12-06 MED ORDER — PAZOPANIB HCL 200 MG PO TABS: 400.0000 mg | ORAL_TABLET | Freq: Every day | ORAL | Status: DC

## 2013-12-08 ENCOUNTER — Telehealth: Payer: Self-pay | Admitting: Hematology and Oncology

## 2013-12-08 ENCOUNTER — Encounter: Payer: Self-pay | Admitting: Hematology and Oncology

## 2013-12-08 NOTE — Progress Notes (Signed)
[employer] approved votrient from 12/06/13-12/07/15

## 2013-12-08 NOTE — Telephone Encounter (Signed)
s.w. pt and daughter and advised on SEpt appt....ok and aware

## 2013-12-14 ENCOUNTER — Other Ambulatory Visit: Payer: Self-pay

## 2013-12-14 ENCOUNTER — Ambulatory Visit: Payer: Medicare PPO

## 2013-12-14 ENCOUNTER — Other Ambulatory Visit: Payer: Self-pay | Admitting: Hematology and Oncology

## 2013-12-14 ENCOUNTER — Other Ambulatory Visit (HOSPITAL_BASED_OUTPATIENT_CLINIC_OR_DEPARTMENT_OTHER): Payer: Medicare PPO

## 2013-12-14 ENCOUNTER — Telehealth: Payer: Self-pay

## 2013-12-14 DIAGNOSIS — C493 Malignant neoplasm of connective and soft tissue of thorax: Secondary | ICD-10-CM

## 2013-12-14 LAB — TSH CHCC: TSH: 1.841 m(IU)/L (ref 0.308–3.960)

## 2013-12-14 LAB — COMPREHENSIVE METABOLIC PANEL (CC13)
ALK PHOS: 80 U/L (ref 40–150)
ALT: 8 U/L (ref 0–55)
AST: 16 U/L (ref 5–34)
Albumin: 3.3 g/dL — ABNORMAL LOW (ref 3.5–5.0)
Anion Gap: 7 mEq/L (ref 3–11)
BUN: 20.8 mg/dL (ref 7.0–26.0)
CO2: 27 mEq/L (ref 22–29)
CREATININE: 1 mg/dL (ref 0.6–1.1)
Calcium: 9.1 mg/dL (ref 8.4–10.4)
Chloride: 106 mEq/L (ref 98–109)
Glucose: 102 mg/dl (ref 70–140)
Potassium: 4.3 mEq/L (ref 3.5–5.1)
Sodium: 141 mEq/L (ref 136–145)
Total Bilirubin: 0.33 mg/dL (ref 0.20–1.20)
Total Protein: 6.7 g/dL (ref 6.4–8.3)

## 2013-12-14 LAB — CBC WITH DIFFERENTIAL/PLATELET
BASO%: 0.3 % (ref 0.0–2.0)
Basophils Absolute: 0 10*3/uL (ref 0.0–0.1)
EOS ABS: 0.5 10*3/uL (ref 0.0–0.5)
EOS%: 7.2 % — ABNORMAL HIGH (ref 0.0–7.0)
HCT: 37.1 % (ref 34.8–46.6)
HGB: 11.6 g/dL (ref 11.6–15.9)
LYMPH%: 14.6 % (ref 14.0–49.7)
MCH: 30.8 pg (ref 25.1–34.0)
MCHC: 31.3 g/dL — ABNORMAL LOW (ref 31.5–36.0)
MCV: 98.4 fL (ref 79.5–101.0)
MONO#: 0.5 10*3/uL (ref 0.1–0.9)
MONO%: 7.2 % (ref 0.0–14.0)
NEUT%: 70.7 % (ref 38.4–76.8)
NEUTROS ABS: 4.6 10*3/uL (ref 1.5–6.5)
Platelets: 215 10*3/uL (ref 145–400)
RBC: 3.77 10*6/uL (ref 3.70–5.45)
RDW: 14 % (ref 11.2–14.5)
WBC: 6.5 10*3/uL (ref 3.9–10.3)
lymph#: 1 10*3/uL (ref 0.9–3.3)

## 2013-12-14 NOTE — Telephone Encounter (Signed)
Patient and daughter had questions about Votrient. Wanted to know if it was ok to take with other medications, what side effects were and how to take it.  [facility] drug info was printed off for patient and also explained per Dr Alvy Bimler that patient is on intermediate dose so minimal side effects are expected.  Also explained that Dr Alvy Bimler wants patient to have a CT next week and scheduling will be calling in the next few days to set it up.  Explained patient should take this medication on a empty stomach so 1hr before or 2 hrs after meals.  Voiced understanding.  Will call back with any questions or concerns.

## 2013-12-20 ENCOUNTER — Encounter (INDEPENDENT_AMBULATORY_CARE_PROVIDER_SITE_OTHER): Payer: Medicare PPO | Admitting: General Surgery

## 2013-12-23 ENCOUNTER — Ambulatory Visit (HOSPITAL_COMMUNITY)
Admission: RE | Admit: 2013-12-23 | Discharge: 2013-12-23 | Disposition: A | Discharge status: Home / Self Care | Payer: Medicare PPO | Source: Ambulatory Visit | Attending: Hematology and Oncology | Admitting: Hematology and Oncology

## 2013-12-23 DIAGNOSIS — C493 Malignant neoplasm of connective and soft tissue of thorax: Secondary | ICD-10-CM | POA: Diagnosis present

## 2013-12-23 MED ORDER — IOHEXOL 300 MG/ML  SOLN
80.0000 mL | Freq: Once | INTRAMUSCULAR | Status: AC | PRN
  Administered 2013-12-23: 80 mL via INTRAVENOUS

## 2013-12-27 ENCOUNTER — Telehealth: Payer: Self-pay | Admitting: Hematology and Oncology

## 2013-12-27 ENCOUNTER — Ambulatory Visit (HOSPITAL_BASED_OUTPATIENT_CLINIC_OR_DEPARTMENT_OTHER): Payer: Medicare PPO | Admitting: Hematology and Oncology

## 2013-12-27 ENCOUNTER — Encounter: Payer: Self-pay | Admitting: Hematology and Oncology

## 2013-12-27 ENCOUNTER — Other Ambulatory Visit (HOSPITAL_BASED_OUTPATIENT_CLINIC_OR_DEPARTMENT_OTHER): Payer: Medicare PPO

## 2013-12-27 VITALS — BP 138/59 | HR 68 | Temp 98.3°F | Resp 18 | Ht 61.0 in | Wt 109.2 lb

## 2013-12-27 DIAGNOSIS — C493 Malignant neoplasm of connective and soft tissue of thorax: Secondary | ICD-10-CM

## 2013-12-27 DIAGNOSIS — K1231 Oral mucositis (ulcerative) due to antineoplastic therapy: Secondary | ICD-10-CM

## 2013-12-27 DIAGNOSIS — R071 Chest pain on breathing: Secondary | ICD-10-CM

## 2013-12-27 DIAGNOSIS — R0789 Other chest pain: Secondary | ICD-10-CM

## 2013-12-27 HISTORY — DX: Oral mucositis (ulcerative) due to antineoplastic therapy: K12.31

## 2013-12-27 LAB — COMPREHENSIVE METABOLIC PANEL (CC13)
ALT: 10 U/L (ref 0–55)
ANION GAP: 10 meq/L (ref 3–11)
AST: 16 U/L (ref 5–34)
Albumin: 3.3 g/dL — ABNORMAL LOW (ref 3.5–5.0)
Alkaline Phosphatase: 78 U/L (ref 40–150)
BUN: 20.4 mg/dL (ref 7.0–26.0)
CALCIUM: 9.1 mg/dL (ref 8.4–10.4)
CHLORIDE: 105 meq/L (ref 98–109)
CO2: 25 meq/L (ref 22–29)
Creatinine: 0.8 mg/dL (ref 0.6–1.1)
Glucose: 106 mg/dl (ref 70–140)
Potassium: 4.5 mEq/L (ref 3.5–5.1)
Sodium: 141 mEq/L (ref 136–145)
Total Bilirubin: 0.3 mg/dL (ref 0.20–1.20)
Total Protein: 6.7 g/dL (ref 6.4–8.3)

## 2013-12-27 LAB — CBC WITH DIFFERENTIAL/PLATELET
BASO%: 0.5 % (ref 0.0–2.0)
BASOS ABS: 0 10*3/uL (ref 0.0–0.1)
EOS%: 9.3 % — ABNORMAL HIGH (ref 0.0–7.0)
Eosinophils Absolute: 0.6 10*3/uL — ABNORMAL HIGH (ref 0.0–0.5)
HCT: 36.9 % (ref 34.8–46.6)
HEMOGLOBIN: 11.6 g/dL (ref 11.6–15.9)
LYMPH#: 1.2 10*3/uL (ref 0.9–3.3)
LYMPH%: 20.5 % (ref 14.0–49.7)
MCH: 30.6 pg (ref 25.1–34.0)
MCHC: 31.4 g/dL — ABNORMAL LOW (ref 31.5–36.0)
MCV: 97.4 fL (ref 79.5–101.0)
MONO#: 0.4 10*3/uL (ref 0.1–0.9)
MONO%: 6.1 % (ref 0.0–14.0)
NEUT#: 3.8 10*3/uL (ref 1.5–6.5)
NEUT%: 63.6 % (ref 38.4–76.8)
Platelets: 220 10*3/uL (ref 145–400)
RBC: 3.79 10*6/uL (ref 3.70–5.45)
RDW: 14.2 % (ref 11.2–14.5)
WBC: 5.9 10*3/uL (ref 3.9–10.3)

## 2013-12-27 MED ORDER — MAGIC MOUTHWASH W/LIDOCAINE: 5.0000 mL | Freq: Three times a day (TID) | ORAL | Status: DC

## 2013-12-27 NOTE — Assessment & Plan Note (Signed)
I recommend a trial of Magic mouthwash

## 2013-12-27 NOTE — Assessment & Plan Note (Signed)
This is due to local invasion of cancer. Currently, the patient appears to be well controlled with current pain medication regimen, albeit with significant side effects of causing sedation, nausea and constipation. She is getting medication refilled through her primary care provider.

## 2013-12-27 NOTE — Assessment & Plan Note (Signed)
She tolerates Pazopanib well so far without major side effects upon from very mild sensation of mucositis. I recommend we continued the same dose without adjustment. I will see her back again in 2 weeks for further supportive care and review of toxicity.

## 2013-12-27 NOTE — Progress Notes (Signed)
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 Pathologic stage pT2 cN0 M0. She had another recurrence outside of the previous field of radiation; and had resection on February 16, 2010 with same pathology but positive margin. She is also s/p adjuvant XRT for each time she had resection. She was started on 06/26/10 with adjuvant chemotherapy with continuous infusion of Adriamycin/Cytoxan over 7 days q4 wks. There was plan for at least 5 cycles. However, she developed recurrent mucositis, worsening performance status, and unrelated problem of severe back pain from degenerative joint disease. Therefore, chemo was discontinued after two cycles. She had been on watchful observation until she developed local recurrent disease in August 2012 10 2014. She underwent repeat resection on December 27, 2010. She was treated with repeat radiation treatment in July 2014. Since discontinuation of treatment, she has continuous growth of her chest wall mass causing severe chest wall pain. On 12/14/2013, she was started on Pazopanib. INTERVAL HISTORY: Please see below for problem oriented charting. She complains of very mild mucositis. Her appetite is stable. Her chest wall pain appears stable. REVIEW OF SYSTEMS:   Constitutional: Denies fevers, chills or abnormal weight loss Eyes: Denies blurriness of vision Ears, nose, mouth, throat, and face: Denies mucositis or sore throat Respiratory: Denies cough, dyspnea or wheezes Cardiovascular: Denies palpitation, chest discomfort or lower extremity swelling Gastrointestinal:  Denies nausea, heartburn or change in bowel habits Skin: Denies abnormal  skin rashes Lymphatics: Denies new lymphadenopathy or easy bruising Neurological:Denies numbness, tingling or new weaknesses Behavioral/Psych: Mood is stable, no new changes  All other systems were reviewed with the patient and are negative.  I have reviewed the past medical history, past surgical history, social history and family history with the patient and they are unchanged from previous note.  ALLERGIES:  is allergic to other; amoxicillin; chocolate; demerol; peanut-containing drug products; adhesive; ampicillin; meperidine and related; morphine and related; nitrofurantoin; sulfa antibiotics; and tetracyclines & related.  MEDICATIONS:  Current Outpatient Prescriptions  Medication Sig Dispense Refill  . alendronate (FOSAMAX) 70 MG tablet Take 70 mg by mouth every 7 (seven) days. Take with a full glass of water on an empty stomach.      Marland Kitchen amiodarone (PACERONE) 200 MG tablet TAKE 1 TABLET (200 MG TOTAL) BY MOUTH EVERY MORNING.  30 tablet  5  . aspirin 325 MG tablet Take 325 mg by mouth every evening.       . Calcium Carbonate-Vitamin D (CALTRATE 600+D PO) Take 1 tablet by mouth 2 (two) times daily.       Marland Kitchen docusate sodium (COLACE) 100 MG capsule Take 300 mg by mouth every evening.       . lidocaine (LIDODERM) 5 % Place onto the skin daily as needed.      . Multiple Vitamins-Iron (MULTIVITAMINS WITH IRON) TABS Take 1 tablet by mouth every morning.       . nitroGLYCERIN (NITROSTAT) 0.4 MG SL tablet Place 1 tablet (0.4 mg total) under the tongue every 5 (five) minutes as needed. For Chest pain  25 tablet  5  . omeprazole (PRILOSEC) 20 MG capsule Take 20 mg by mouth every morning.       . ondansetron (ZOFRAN) 4 MG  tablet Take 1 tablet (4 mg total) by mouth every 8 (eight) hours as needed for nausea.  20 tablet  1  . oxyCODONE-acetaminophen (PERCOCET) 7.5-325 MG per tablet Take 1 tablet by mouth every 6 (six) hours as needed for pain.  40 tablet  0  . OXYCONTIN 10 MG T12A 10 mg daily.       .  pazopanib (VOTRIENT) 200 MG tablet Take 2 tablets (400 mg total) by mouth daily. Take on an empty stomach.  60 tablet  0  . RESTASIS 0.05 % ophthalmic emulsion Place 1 drop into both eyes daily.       . sertraline (ZOLOFT) 50 MG tablet Take 50 mg by mouth every evening.       . Alum & Mag Hydroxide-Simeth (MAGIC MOUTHWASH W/LIDOCAINE) SOLN Take 5 mLs by mouth 3 (three) times daily.  240 mL  0   No current facility-administered medications for this visit.    PHYSICAL EXAMINATION: ECOG PERFORMANCE STATUS: 1 - Symptomatic but completely ambulatory  Filed Vitals:   12/27/13 1513  BP: 138/59  Pulse: 68  Temp: 98.3 F (36.8 C)  Resp: 18   Filed Weights   12/27/13 1513  Weight: 109 lb 3.2 oz (49.533 kg)    GENERAL:alert, no distress and comfortable SKIN: skin color, texture, turgor are normal, no rashes or significant lesions EYES: normal, Conjunctiva are pink and non-injected, sclera clear Musculoskeletal:no cyanosis of digits and no clubbing  NEURO: alert & oriented x 3 with fluent speech, no focal motor/sensory deficits  LABORATORY DATA:  I have reviewed the data as listed    Component Value Date/Time   NA 141 12/27/2013 1454   NA 140 09/30/2012 1020   K 4.5 12/27/2013 1454   K 3.9 09/30/2012 1020   CL 105 09/30/2012 1020   CL 103 09/28/2012 1517   CO2 25 12/27/2013 1454   CO2 28 09/30/2012 1020   GLUCOSE 106 12/27/2013 1454   GLUCOSE 108* 09/30/2012 1020   GLUCOSE 79 09/28/2012 1517   BUN 20.4 12/27/2013 1454   BUN 19 09/30/2012 1020   CREATININE 0.8 12/27/2013 1454   CREATININE 0.81 09/30/2012 1020   CALCIUM 9.1 12/27/2013 1454   CALCIUM 8.8 09/30/2012 1020   PROT 6.7 12/27/2013 1454   PROT 6.3 11/29/2011 1036   ALBUMIN 3.3* 12/27/2013 1454   ALBUMIN 3.8 11/29/2011 1036   AST 16 12/27/2013 1454   AST 18 11/29/2011 1036   ALT 10 12/27/2013 1454   ALT 13 11/29/2011 1036   ALKPHOS 78 12/27/2013 1454   ALKPHOS 80 11/29/2011 1036   BILITOT 0.30 12/27/2013 1454   BILITOT 0.3 11/29/2011 1036    GFRNONAA 63* 09/30/2012 1020   GFRAA 72* 09/30/2012 1020    No results found for this basename: SPEP,  UPEP,   kappa and lambda light chains    Lab Results  Component Value Date   WBC 5.9 12/27/2013   NEUTROABS 3.8 12/27/2013   HGB 11.6 12/27/2013   HCT 36.9 12/27/2013   MCV 97.4 12/27/2013   PLT 220 12/27/2013      Chemistry      Component Value Date/Time   NA 141 12/27/2013 1454   NA 140 09/30/2012 1020   K 4.5 12/27/2013 1454   K 3.9 09/30/2012 1020   CL 105 09/30/2012 1020   CL 103 09/28/2012 1517   CO2 25 12/27/2013 1454   CO2 28 09/30/2012 1020   BUN 20.4 12/27/2013 1454   BUN 19 09/30/2012 1020  CREATININE 0.8 12/27/2013 1454   CREATININE 0.81 09/30/2012 1020      Component Value Date/Time   CALCIUM 9.1 12/27/2013 1454   CALCIUM 8.8 09/30/2012 1020   ALKPHOS 78 12/27/2013 1454   ALKPHOS 80 11/29/2011 1036   AST 16 12/27/2013 1454   AST 18 11/29/2011 1036   ALT 10 12/27/2013 1454   ALT 13 11/29/2011 1036   BILITOT 0.30 12/27/2013 1454   BILITOT 0.3 11/29/2011 1036       RADIOGRAPHIC STUDIES: Reviewed the CT scan with the patient and family I have personally reviewed the radiological images as listed and agreed with the findings in the report.  ASSESSMENT & PLAN:  Soft tissue sarcoma of chest wall She tolerates Pazopanib well so far without major side effects upon from very mild sensation of mucositis. I recommend we continued the same dose without adjustment. I will see her back again in 2 weeks for further supportive care and review of toxicity.  Mucositis (ulcerative) due to antineoplastic therapy I recommend a trial of Magic mouthwash  Chest wall pain This is due to local invasion of cancer. Currently, the patient appears to be well controlled with current pain medication regimen, albeit with significant side effects of causing sedation, nausea and constipation. She is getting medication refilled through her primary care provider.    No orders of the defined types  were placed in this encounter.   All questions were answered. The patient knows to call the clinic with any problems, questions or concerns. No barriers to learning was detected. I spent 30 minutes counseling the patient face to face. The total time spent in the appointment was 40 minutes and more than 50% was on counseling and review of test results     [facility], [place], MD 12/27/2013 9:36 PM

## 2013-12-27 NOTE — Telephone Encounter (Signed)
Pt confirmed labs/ov per 09/21 POF, gave pt AVS...Marland KitchenMarland KitchenKJ

## 2014-01-01 ENCOUNTER — Other Ambulatory Visit: Payer: Self-pay | Admitting: Interventional Cardiology

## 2014-01-10 ENCOUNTER — Telehealth: Payer: Self-pay | Admitting: Hematology and Oncology

## 2014-01-10 ENCOUNTER — Emergency Department (HOSPITAL_COMMUNITY): Payer: Medicare PPO

## 2014-01-10 ENCOUNTER — Emergency Department (HOSPITAL_COMMUNITY)
Admission: EM | Admit: 2014-01-10 | Discharge: 2014-01-10 | Disposition: A | Discharge status: Home / Self Care | Payer: Medicare PPO | Attending: Emergency Medicine | Admitting: Emergency Medicine

## 2014-01-10 ENCOUNTER — Telehealth: Payer: Self-pay | Admitting: *Deleted

## 2014-01-10 ENCOUNTER — Encounter (HOSPITAL_COMMUNITY): Payer: Self-pay | Admitting: Emergency Medicine

## 2014-01-10 DIAGNOSIS — R079 Chest pain, unspecified: Secondary | ICD-10-CM | POA: Diagnosis present

## 2014-01-10 DIAGNOSIS — Z7982 Long term (current) use of aspirin: Secondary | ICD-10-CM | POA: Diagnosis not present

## 2014-01-10 DIAGNOSIS — Z8701 Personal history of pneumonia (recurrent): Secondary | ICD-10-CM | POA: Insufficient documentation

## 2014-01-10 DIAGNOSIS — Z853 Personal history of malignant neoplasm of breast: Secondary | ICD-10-CM | POA: Diagnosis not present

## 2014-01-10 DIAGNOSIS — I10 Essential (primary) hypertension: Secondary | ICD-10-CM | POA: Diagnosis not present

## 2014-01-10 DIAGNOSIS — M1990 Unspecified osteoarthritis, unspecified site: Secondary | ICD-10-CM | POA: Insufficient documentation

## 2014-01-10 DIAGNOSIS — R0602 Shortness of breath: Secondary | ICD-10-CM | POA: Diagnosis not present

## 2014-01-10 DIAGNOSIS — Z923 Personal history of irradiation: Secondary | ICD-10-CM | POA: Diagnosis not present

## 2014-01-10 DIAGNOSIS — K219 Gastro-esophageal reflux disease without esophagitis: Secondary | ICD-10-CM | POA: Insufficient documentation

## 2014-01-10 DIAGNOSIS — I4891 Unspecified atrial fibrillation: Secondary | ICD-10-CM | POA: Insufficient documentation

## 2014-01-10 DIAGNOSIS — Z79899 Other long term (current) drug therapy: Secondary | ICD-10-CM | POA: Diagnosis not present

## 2014-01-10 DIAGNOSIS — G8929 Other chronic pain: Secondary | ICD-10-CM | POA: Insufficient documentation

## 2014-01-10 DIAGNOSIS — Z87440 Personal history of urinary (tract) infections: Secondary | ICD-10-CM | POA: Diagnosis not present

## 2014-01-10 DIAGNOSIS — D649 Anemia, unspecified: Secondary | ICD-10-CM | POA: Insufficient documentation

## 2014-01-10 DIAGNOSIS — H9190 Unspecified hearing loss, unspecified ear: Secondary | ICD-10-CM | POA: Insufficient documentation

## 2014-01-10 DIAGNOSIS — Z8673 Personal history of transient ischemic attack (TIA), and cerebral infarction without residual deficits: Secondary | ICD-10-CM | POA: Insufficient documentation

## 2014-01-10 DIAGNOSIS — R0789 Other chest pain: Secondary | ICD-10-CM | POA: Insufficient documentation

## 2014-01-10 DIAGNOSIS — Z880 Allergy status to penicillin: Secondary | ICD-10-CM | POA: Insufficient documentation

## 2014-01-10 LAB — CBC
HCT: 33.6 % — ABNORMAL LOW (ref 36.0–46.0)
HEMOGLOBIN: 11 g/dL — AB (ref 12.0–15.0)
MCH: 31.4 pg (ref 26.0–34.0)
MCHC: 32.7 g/dL (ref 30.0–36.0)
MCV: 96 fL (ref 78.0–100.0)
Platelets: 204 10*3/uL (ref 150–400)
RBC: 3.5 MIL/uL — ABNORMAL LOW (ref 3.87–5.11)
RDW: 14.8 % (ref 11.5–15.5)
WBC: 15.1 10*3/uL — AB (ref 4.0–10.5)

## 2014-01-10 LAB — I-STAT TROPONIN, ED: TROPONIN I, POC: 0.02 ng/mL (ref 0.00–0.08)

## 2014-01-10 LAB — BASIC METABOLIC PANEL
ANION GAP: 15 (ref 5–15)
BUN: 19 mg/dL (ref 6–23)
CO2: 23 mEq/L (ref 19–32)
Calcium: 8.6 mg/dL (ref 8.4–10.5)
Chloride: 99 mEq/L (ref 96–112)
Creatinine, Ser: 0.75 mg/dL (ref 0.50–1.10)
GFR calc Af Amer: 84 mL/min — ABNORMAL LOW (ref >90)
GFR calc non Af Amer: 72 mL/min — ABNORMAL LOW (ref >90)
GLUCOSE: 122 mg/dL — AB (ref 70–99)
POTASSIUM: 4.1 meq/L (ref 3.7–5.3)
Sodium: 137 mEq/L (ref 137–147)

## 2014-01-10 MED ORDER — IOHEXOL 350 MG/ML SOLN
100.0000 mL | Freq: Once | INTRAVENOUS | Status: AC | PRN
  Administered 2014-01-10: 100 mL via INTRAVENOUS

## 2014-01-10 NOTE — ED Notes (Signed)
Patient with no complaints at this time. Respirations even and unlabored. Skin warm/dry. Discharge instructions reviewed with patient at this time. Patient given opportunity to voice concerns/ask questions. IV removed per policy and band-aid applied to site. Patient discharged at this time and left Emergency Department with steady gait.  

## 2014-01-10 NOTE — ED Notes (Addendum)
Per EMS pt with Hx of breast cancer and left chest wall spindle cell sarcoma reports from home for right sided chest pain radiating to back. Family states pt has intermittent episodes of being "pale and clammy." Pt took oxycodone at 1030 today and received some relief.

## 2014-01-10 NOTE — ED Notes (Signed)
Bed: WA17 Expected date:  Expected time:  Means of arrival:  Comments: EMS- R side chest pain, CA Pt

## 2014-01-10 NOTE — Telephone Encounter (Signed)
See her/add on at 330 pm today

## 2014-01-10 NOTE — Telephone Encounter (Signed)
Daughter states pt having sharp chest pains on right side since last night.  Some difficulty catching her breath at times.  Called EMS last night and they said pt's VS were stable and suggested to f/u w/ MD in morning.   Pt has appt to see Dr. Alvy Bimler tomorrow,  but daughter asks if she should bring pt in today?

## 2014-01-10 NOTE — Telephone Encounter (Signed)
Notified daughter of appt today and she says pt already taken to ED.  Pt's pain was getting worse so she called 911.

## 2014-01-10 NOTE — ED Provider Notes (Signed)
CSN: [id number]     Arrival date & time 01/10/14  1142 History   First MD Initiated Contact with Patient 01/10/14 1406     Chief Complaint  Patient presents with  . Chest Pain     (Consider location/radiation/quality/duration/timing/severity/associated sxs/prior Treatment) HPI Comments: Patient presents to the ER for chest pain. She reports increased pain in the right side of her chest. Patient has spindle cell sarcoma of the right chest wall. She has chronic pain, but the pain worsened today. She also reports that she could not catch her breath. Reports that she became pale and clammy. She took an oxycodone prior to coming to the ER and pain is improved.  Patient is a 80 y.o. female presenting with chest pain.  Chest Pain Associated symptoms: shortness of breath     Past Medical History  Diagnosis Date  . Breast cancer 1996    (Lt) breast ca dx 1996  . 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 Hyperlipidemia   . Arthritis     osteoarthritis  . IBD (inflammatory bowel disease)   . Gallstones   . Fracture of left hand   . Stroke     "mild stroke in 2012"  . Vertigo     hx of  . Pneumonia     hx of  . Anemia     hx of  . Blood transfusion     "in 1999"  . Urinary tract infection     hx of  . GERD (gastroesophageal reflux disease)   . Headache(784.0)     "migraines hx of"  . Degenerative disc disease     hx of  . Hx of radiation therapy 07/2009 -08/2009, 04/2010- 05/2010    right chest wall  . History of radiation therapy 09/23/11-11/04/11    right paraspinal chest wall 560Gy   . A-fib   . Chronic back pain   . Wears glasses   . HOH (hard of hearing)   . Peripheral vascular disease     neuropathy in feet per patient  . History of radiation therapy 09/23/12-11/02/12    right axilla, 5000 cGy 25 sessions, boost 600 cGy 3 sessions  . Hypertension   . MVP (mitral valve prolapse)   . Mucositis (ulcerative) due to antineoplastic therapy 12/27/2013    Past Surgical History  Procedure Laterality Date  . Mastectomy  1996  . Abdominal hysterectomy  1999  . Bowel resection  1999  . Cholecystectomy  2005  . 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 Sarcoma excision  12/27/10    rt axillary and back  . Eye surgery      bilateral cataract surgery  . Kyphosis surgery       x 3  . Mass excision  08/12/2011    sarcoma-Procedure: EXCISION MASS;  Surgeon: Zenovia Jarred, MD;  Location: [facility];  Service: General;  Laterality: Right;  excision mass right paraspinous area, excision mass right chest wall  . Mass excision Right 08/14/2012    Procedure: EXCISION MASS RIGHT AXIULA and right back;  Surgeon: Zenovia Jarred, MD;  Location: [facility];  Service: General;  Laterality: Right;   Family History  Problem Relation Age of Onset  . Heart disease Mother   . Diabetes Mother   . Heart disease Father   . Kidney disease Father   . Hypertension Father   .  Heart disease Sister   . Cancer Brother     prostate  . Heart disease Brother   . Cancer Son     pancreatic   History  Substance Use Topics  . Smoking status: Never Smoker   . Smokeless tobacco: Never Used  . Alcohol Use: No   OB History   Grav Para Term Preterm Abortions TAB SAB Ect Mult Living                 Review of Systems  Respiratory: Positive for shortness of breath.   Cardiovascular: Positive for chest pain.  All other systems reviewed and are negative.     Allergies  Other; Amoxicillin; Chocolate; Demerol; Peanut-containing drug products; Adhesive; Ampicillin; Meperidine and related; Morphine and related; Nitrofurantoin; Sulfa antibiotics; and Tetracyclines & related  Home Medications   Prior to Admission medications   Medication Sig Start Date End Date Taking? Authorizing Provider  alendronate (FOSAMAX) 70 MG tablet Take 70 mg by mouth every 7 (seven) days. Take with a full glass of water on an empty  stomach.   Yes Historical Provider, MD  aminophylline 200 MG tablet Take 200 mg by mouth every morning.   Yes Historical Provider, MD  aspirin 325 MG tablet Take 325 mg by mouth every evening.    Yes Historical Provider, MD  Calcium Carbonate-Vitamin D (CALTRATE 600+D PO) Take 1 tablet by mouth 2 (two) times daily.    Yes Historical Provider, MD  docusate sodium (COLACE) 100 MG capsule Take 300 mg by mouth every evening.    Yes Historical Provider, MD  lidocaine (LIDODERM) 5 % Place 1 patch onto the skin daily.  11/16/13  Yes Historical Provider, MD  Liniments (DEEP BLUE RELIEF EX) Apply 1 application topically daily as needed (for pain).   Yes Historical Provider, MD  Multiple Vitamins-Iron (MULTIVITAMINS WITH IRON) TABS Take 1 tablet by mouth every morning.    Yes Historical Provider, MD  nitroGLYCERIN (NITROSTAT) 0.4 MG SL tablet Place 1 tablet (0.4 mg total) under the tongue every 5 (five) minutes as needed. For Chest pain 05/12/13  Yes Jettie Booze, MD  omeprazole (PRILOSEC) 20 MG capsule Take 20 mg by mouth every morning.    Yes Historical Provider, MD  ondansetron (ZOFRAN) 4 MG tablet Take 1 tablet (4 mg total) by mouth every 8 (eight) hours as needed for nausea. 10/13/13  Yes Georganna Skeans, MD  oxyCODONE-acetaminophen (PERCOCET) 7.5-325 MG per tablet Take 1 tablet by mouth every 6 (six) hours as needed for pain. 08/14/12  Yes Georganna Skeans, MD  OXYCONTIN 10 MG T12A 10 mg at bedtime.  06/18/12  Yes Historical Provider, MD  pazopanib (VOTRIENT) 200 MG tablet Take 2 tablets (400 mg total) by mouth daily. Take on an empty stomach. 12/06/13  Yes Ni Gorsuch, MD  RESTASIS 0.05 % ophthalmic emulsion Place 1 drop into both eyes daily.  09/22/12  Yes Historical Provider, MD  sertraline (ZOLOFT) 50 MG tablet Take 50 mg by mouth every evening.  06/08/12  Yes Historical Provider, MD   BP 112/56  Pulse 79  Temp(Src) 98.7 F (37.1 C) (Oral)  Resp 19  SpO2 93% Physical Exam  Constitutional: She is  oriented to person, place, and time. She appears well-developed and well-nourished. No distress.  HENT:  Head: Normocephalic and atraumatic.  Right Ear: Hearing normal.  Left Ear: Hearing normal.  Nose: Nose normal.  Mouth/Throat: Oropharynx is clear and moist and mucous membranes are normal.  Eyes: Conjunctivae and EOM are  normal. Pupils are equal, round, and reactive to light.  Neck: Normal range of motion. Neck supple.  Cardiovascular: Regular rhythm, S1 normal and S2 normal.  Exam reveals no gallop and no friction rub.   No murmur heard. Pulmonary/Chest: Effort normal and breath sounds normal. No respiratory distress. She exhibits tenderness.    Abdominal: Soft. Normal appearance and bowel sounds are normal. There is no hepatosplenomegaly. There is no tenderness. There is no rebound, no guarding, no tenderness at McBurney's point and negative Murphy's sign. No hernia.  Musculoskeletal: Normal range of motion.  Neurological: She is alert and oriented to person, place, and time. She has normal strength. No cranial nerve deficit or sensory deficit. Coordination normal. GCS eye subscore is 4. GCS verbal subscore is 5. GCS motor subscore is 6.  Skin: Skin is warm, dry and intact. No rash noted. No cyanosis.  Psychiatric: She has a normal mood and affect. Her speech is normal and behavior is normal. Thought content normal.    ED Course  Procedures (including critical care time) Labs Review Labs Reviewed  CBC - Abnormal; Notable for the following:    WBC 15.1 (*)    RBC 3.50 (*)    Hemoglobin 11.0 (*)    HCT 33.6 (*)    All other components within normal limits  BASIC METABOLIC PANEL - Abnormal; Notable for the following:    Glucose, Bld 122 (*)    GFR calc non Af Amer 72 (*)    GFR calc Af Amer 84 (*)    All other components within normal limits  I-STAT TROPOININ, ED    Imaging Review Dg Chest Port 1 View  01/10/2014   CLINICAL DATA:  Right side chest pain with deep  inspiration. Shortness of breath.  EXAM: PORTABLE CHEST - 1 VIEW  COMPARISON:  CT chest 12/23/2013.  PA and lateral chest 08/18/2013.  FINDINGS: The patient is status post left mastectomy and bilateral axillary dissection. Small amount of fluid or thickening in the minor fissure is noted. The lungs are otherwise clear. Heart size is normal. Soft tissue fullness in the right axilla correlates with soft tissue mass seen on comparison CT scan. Marked thoracolumbar scoliosis is noted. The patient is status post vertebral augmentation in the lumbar spine.  IMPRESSION: No acute cardiopulmonary disease.  Marked soft tissue fullness in the right axilla correlates with mass seen on CT scan.   Electronically Signed   By: Inge Rise M.D.   On: 01/10/2014 12:41     EKG Interpretation   Date/Time:  Monday January 10 2014 11:50:40 EDT Ventricular Rate:  83 PR Interval:  194 QRS Duration: 112 QT Interval:  401 QTC Calculation: 471 R Axis:   -63 Text Interpretation:  Sinus rhythm Incomplete right bundle branch block  Inferior infarct, old Anteroseptal infarct, age indeterminate Non-specific  change in ST segment in lateral leads, new since last tracing Confirmed by  [facility]  MD, [place] [phone]) on 01/10/2014 2:08:15 PM      MDM   Final diagnoses:  None   chest wall pain secondary to sarcoma  Patient presents to the ER for evaluation of pain in the right side of her chest. Patient has infiltrating sarcoma in that region. She is currently being treated with chemotherapy. Patient had increased pain today with breathing and felt short of breath. Chest x-ray did not show any acute abnormality. She was feeling much better after she took oxycodone prior to arrival. She has been reluctant to take her pain medicine  at home.  Her workup has been unrevealing. She did have some nonspecific ST/T-wave changes on her EKG, but this is not consistent with acute coronary syndrome. Patient's tumor does explain the  pain. CT angiography was performed to rule out PE. No PE was seen, there is slight interval increase in the tumor, however. No other acute pathology such as rib fracture, etc. to expand patient's pain. She continues to do well here in the ER. I did counsel her and her family about the ability for her to take increased pain medicine. She is currently taking Percocet 5/325 one tablet 3 times a day. She was told to take up to 2 tablets every 4 hours if needed. She is follow up with her oncologist tomorrow.    Orpah Greek, MD 01/10/14 1534

## 2014-01-10 NOTE — Discharge Instructions (Signed)

## 2014-01-10 NOTE — Telephone Encounter (Signed)
Faxed pt medical records to [facility] [place] °

## 2014-01-11 ENCOUNTER — Ambulatory Visit
Admission: RE | Admit: 2014-01-11 | Discharge: 2014-01-11 | Disposition: A | Discharge status: Home / Self Care | Payer: Medicare PPO | Source: Ambulatory Visit | Attending: Radiation Oncology | Admitting: Radiation Oncology

## 2014-01-11 ENCOUNTER — Other Ambulatory Visit: Payer: Medicare PPO

## 2014-01-11 ENCOUNTER — Telehealth: Payer: Self-pay | Admitting: Hematology and Oncology

## 2014-01-11 ENCOUNTER — Encounter: Payer: Self-pay | Admitting: Radiation Oncology

## 2014-01-11 ENCOUNTER — Other Ambulatory Visit: Payer: Self-pay | Admitting: *Deleted

## 2014-01-11 ENCOUNTER — Ambulatory Visit (HOSPITAL_BASED_OUTPATIENT_CLINIC_OR_DEPARTMENT_OTHER): Payer: Medicare PPO | Admitting: Hematology and Oncology

## 2014-01-11 ENCOUNTER — Encounter: Payer: Self-pay | Admitting: Hematology and Oncology

## 2014-01-11 VITALS — BP 124/55 | HR 74 | Temp 97.8°F | Resp 20 | Wt 108.5 lb

## 2014-01-11 VITALS — BP 130/57 | HR 74 | Temp 98.4°F | Resp 18 | Ht 61.0 in | Wt 109.6 lb

## 2014-01-11 DIAGNOSIS — K1231 Oral mucositis (ulcerative) due to antineoplastic therapy: Secondary | ICD-10-CM

## 2014-01-11 DIAGNOSIS — D630 Anemia in neoplastic disease: Secondary | ICD-10-CM

## 2014-01-11 DIAGNOSIS — C493 Malignant neoplasm of connective and soft tissue of thorax: Secondary | ICD-10-CM

## 2014-01-11 DIAGNOSIS — R5383 Other fatigue: Secondary | ICD-10-CM

## 2014-01-11 DIAGNOSIS — R0789 Other chest pain: Secondary | ICD-10-CM

## 2014-01-11 HISTORY — DX: Other fatigue: R53.83

## 2014-01-11 MED ORDER — PAZOPANIB HCL 200 MG PO TABS: 400.0000 mg | ORAL_TABLET | Freq: Every day | ORAL | Status: AC

## 2014-01-11 NOTE — Assessment & Plan Note (Signed)
I recommend a trial of Magic mouthwash.

## 2014-01-11 NOTE — Assessment & Plan Note (Signed)
This is likely anemia of chronic disease. The patient denies recent history of bleeding such as epistaxis, hematuria or hematochezia. She is asymptomatic from the anemia. We will observe for now.  

## 2014-01-11 NOTE — Assessment & Plan Note (Signed)
I recommend she titrates pain medication as needed to get her pain under control. She is getting medication refilled through her primary care provider.

## 2014-01-11 NOTE — Assessment & Plan Note (Signed)
She tolerates Pazopanib well so far without major side effects upon from very mild sensation of mucositis. I recommend we continued the same dose without adjustment. I will see her back again in 4 weeks for further supportive care and review of toxicity. Her most recent CT scan shows stable disease. I plan to repeat that in 3 months.

## 2014-01-11 NOTE — Progress Notes (Signed)
Followup note:  Veronica Ellison visits today approximately 14 months following completion of additional radiation therapy to her right axilla in the management of her recurrent soft tissue sarcoma involving her right axilla/chest wall. She continues with her OxyContin and Percocet for her chronic low back pain and also her right axillary/chest wall pain. She was started on pazopanib through Dr. Alvy Bimler one month ago. She visited the emergency room yesterday for right chest pain. A CT scan was obtained which showed further disease progression along her chest/axilla but no evidence for pulmonary metastases or pulmonary emboli. She has only been taking OxyContin 10 mg tab once a day.  Physical examination: Alert and cheerful. Filed Vitals:   01/11/14 1106  BP: 124/55  Pulse: 74  Temp: 97.8 F (36.6 C)  Resp: 20   Head and neck examination: There is no cervical or supraclavicular lymphadenopathy. Chest the continues to be progression of her previously noted disease along the right posterior axilla/chest wall with some degree of radiation fibrosis. The mass is fixed. There is some nodularity along the axillary apex but no actual ulceration of skin. There is limited extension of the right shoulder. Neurovascular intact.  Impression: Further local regional progression without evidence for pulmonary metastases.  Plan: She will continue with her targeted therapy through Dr. Alvy Bimler. I instructed her to increase her OxyContin to 10 mg by mouth twice a day for better pain control. I can see Veronica Ellison when necessary since she is being followed by Dr. Alvy Bimler.

## 2014-01-11 NOTE — Telephone Encounter (Signed)
gv and printed appt sched and avs for pt for NOV °

## 2014-01-11 NOTE — Progress Notes (Signed)
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 Pathologic stage pT2 cN0 M0. She had another recurrence outside of the previous field of radiation; and had resection on February 16, 2010 with same pathology but positive margin. She is also s/p adjuvant XRT for each time she had resection. She was started on 06/26/10 with adjuvant chemotherapy with continuous infusion of Adriamycin/Cytoxan over 7 days q4 wks. There was plan for at least 5 cycles. However, she developed recurrent mucositis, worsening performance status, and unrelated problem of severe back pain from degenerative joint disease. Therefore, chemo was discontinued after two cycles. She had been on watchful observation until she developed local recurrent disease in August 2012 10 2014. She underwent repeat resection on December 27, 2010. She was treated with repeat radiation treatment in July 2014. Since discontinuation of treatment, she has continuous growth of her chest wall mass causing severe chest wall pain. On 12/14/2013, she was started on Pazopanib. On 01/10/2014, CT scan of the chest shows stable disease. INTERVAL HISTORY: Please see below for problem oriented charting. She was seen in the emergency department due to severe, uncontrollable pain from her chest wall with associated difficulties with taking in deep breath. CT angiogram was negative for PE. Her pain is currently well controlled. She is rating her pain as a 1/10 pain right now. She denies further shortness of breath.  REVIEW OF SYSTEMS:   Constitutional: Denies fevers, chills or abnormal weight loss Eyes: Denies blurriness of vision Ears, nose,  mouth, throat, and face: Denies mucositis or sore throat Respiratory: Denies cough, dyspnea or wheezes Cardiovascular: Denies palpitation, chest discomfort or lower extremity swelling Gastrointestinal:  Denies nausea, heartburn or change in bowel habits Skin: Denies abnormal skin rashes Lymphatics: Denies new lymphadenopathy or easy bruising Neurological:Denies numbness, tingling or new weaknesses Behavioral/Psych: Mood is stable, no new changes  All other systems were reviewed with the patient and are negative.  I have reviewed the past medical history, past surgical history, social history and family history with the patient and they are unchanged from previous note.  ALLERGIES:  is allergic to other; amoxicillin; chocolate; demerol; peanut-containing drug products; adhesive; ampicillin; meperidine and related; morphine and related; nitrofurantoin; sulfa antibiotics; and tetracyclines & related.  MEDICATIONS:  Current Outpatient Prescriptions  Medication Sig Dispense Refill  . alendronate (FOSAMAX) 70 MG tablet Take 70 mg by mouth every 7 (seven) days. Take with a full glass of water on an empty stomach.      Marland Kitchen aminophylline 200 MG tablet Take 200 mg by mouth every morning.      Marland Kitchen amiodarone (PACERONE) 200 MG tablet       . aspirin 325 MG tablet Take 325 mg by mouth every evening.       . Calcium Carbonate-Vitamin D (CALTRATE 600+D PO) Take 1 tablet by mouth 2 (two) times daily.       Marland Kitchen docusate sodium (COLACE) 100 MG capsule Take 300 mg by mouth every evening.       . lidocaine (LIDODERM) 5 % Place 1 patch onto the skin daily.       . Liniments (DEEP BLUE RELIEF EX) Apply 1 application topically daily as needed (for pain).      Marland Kitchen  Multiple Vitamins-Iron (MULTIVITAMINS WITH IRON) TABS Take 1 tablet by mouth every morning.       . nitroGLYCERIN (NITROSTAT) 0.4 MG SL tablet Place 1 tablet (0.4 mg total) under the tongue every 5 (five) minutes as needed. For Chest pain  25 tablet  5  .  omeprazole (PRILOSEC) 20 MG capsule Take 20 mg by mouth every morning.       . ondansetron (ZOFRAN) 4 MG tablet Take 1 tablet (4 mg total) by mouth every 8 (eight) hours as needed for nausea.  20 tablet  1  . oxyCODONE-acetaminophen (PERCOCET) 7.5-325 MG per tablet Take 1 tablet by mouth every 6 (six) hours as needed for pain.  40 tablet  0  . OXYCONTIN 10 MG T12A 10 mg at bedtime.       . pazopanib (VOTRIENT) 200 MG tablet Take 2 tablets (400 mg total) by mouth daily. Take on an empty stomach.  60 tablet  9  . RESTASIS 0.05 % ophthalmic emulsion Place 1 drop into both eyes daily.       . sertraline (ZOLOFT) 50 MG tablet Take 50 mg by mouth every evening.        No current facility-administered medications for this visit.    PHYSICAL EXAMINATION: ECOG PERFORMANCE STATUS: 1 - Symptomatic but completely ambulatory  Filed Vitals:   01/11/14 1519  BP: 130/57  Pulse: 74  Temp: 98.4 F (36.9 C)  Resp: 18   Filed Weights   01/11/14 1519  Weight: 109 lb 9.6 oz (49.714 kg)    GENERAL:alert, no distress and comfortable SKIN: skin color, texture, turgor are normal, no rashes or significant lesions EYES: normal, Conjunctiva are pink and non-injected, sclera clear OROPHARYNX:no exudate, no erythema and lips, buccal mucosa, and tongue normal  NECK: supple, thyroid normal size, non-tender, without nodularity LYMPH:  Persistent soft tissue mass on the right axilla, unchanged compared to previous exam LUNGS: clear to auscultation and percussion with normal breathing effort HEART: regular rate & rhythm and no murmurs and no lower extremity edema ABDOMEN:abdomen soft, non-tender and normal bowel sounds Musculoskeletal:no cyanosis of digits and no clubbing  NEURO: alert & oriented x 3 with fluent speech, no focal motor/sensory deficits  LABORATORY DATA:  I have reviewed the data as listed    Component Value Date/Time   NA 137 01/10/2014 1215   NA 141 12/27/2013 1454   K 4.1 01/10/2014 1215   K  4.5 12/27/2013 1454   CL 99 01/10/2014 1215   CL 103 09/28/2012 1517   CO2 23 01/10/2014 1215   CO2 25 12/27/2013 1454   GLUCOSE 122* 01/10/2014 1215   GLUCOSE 106 12/27/2013 1454   GLUCOSE 79 09/28/2012 1517   BUN 19 01/10/2014 1215   BUN 20.4 12/27/2013 1454   CREATININE 0.75 01/10/2014 1215   CREATININE 0.8 12/27/2013 1454   CALCIUM 8.6 01/10/2014 1215   CALCIUM 9.1 12/27/2013 1454   PROT 6.7 12/27/2013 1454   PROT 6.3 11/29/2011 1036   ALBUMIN 3.3* 12/27/2013 1454   ALBUMIN 3.8 11/29/2011 1036   AST 16 12/27/2013 1454   AST 18 11/29/2011 1036   ALT 10 12/27/2013 1454   ALT 13 11/29/2011 1036   ALKPHOS 78 12/27/2013 1454   ALKPHOS 80 11/29/2011 1036   BILITOT 0.30 12/27/2013 1454   BILITOT 0.3 11/29/2011 1036   GFRNONAA 72* 01/10/2014 1215   GFRAA 84* 01/10/2014 1215    No results found for this basename: SPEP,  UPEP,   kappa and lambda light  chains    Lab Results  Component Value Date   WBC 15.1* 01/10/2014   NEUTROABS 3.8 12/27/2013   HGB 11.0* 01/10/2014   HCT 33.6* 01/10/2014   MCV 96.0 01/10/2014   PLT 204 01/10/2014      Chemistry      Component Value Date/Time   NA 137 01/10/2014 1215   NA 141 12/27/2013 1454   K 4.1 01/10/2014 1215   K 4.5 12/27/2013 1454   CL 99 01/10/2014 1215   CL 103 09/28/2012 1517   CO2 23 01/10/2014 1215   CO2 25 12/27/2013 1454   BUN 19 01/10/2014 1215   BUN 20.4 12/27/2013 1454   CREATININE 0.75 01/10/2014 1215   CREATININE 0.8 12/27/2013 1454      Component Value Date/Time   CALCIUM 8.6 01/10/2014 1215   CALCIUM 9.1 12/27/2013 1454   ALKPHOS 78 12/27/2013 1454   ALKPHOS 80 11/29/2011 1036   AST 16 12/27/2013 1454   AST 18 11/29/2011 1036   ALT 10 12/27/2013 1454   ALT 13 11/29/2011 1036   BILITOT 0.30 12/27/2013 1454   BILITOT 0.3 11/29/2011 1036       RADIOGRAPHIC STUDIES: I have personally reviewed the radiological images as listed and agreed with the findings in the report. Ct Angio Chest Pe W/cm &/or Wo Cm  01/10/2014   CLINICAL DATA:  Right chest pain  radiating to the back. History of breast cancer and left chest wall spindle cell sarcoma.  EXAM: CT ANGIOGRAPHY CHEST WITH CONTRAST  TECHNIQUE: Multidetector CT imaging of the chest was performed using the standard protocol during bolus administration of intravenous contrast. Multiplanar CT image reconstructions and MIPs were obtained to evaluate the vascular anatomy.  CONTRAST:  128mL OMNIPAQUE IOHEXOL 350 MG/ML SOLN  COMPARISON:  Portable chest obtained earlier today and chest CT dated 12/23/2013.  FINDINGS: The previously demonstrated large infiltrating mass in the right upper chest and shoulder area is not included in its entirety. The included portions are minimally larger. The subscapular portion currently measures 2.8 cm in maximum thickness on image number 59 and previously measured 2.6 cm in maximum thickness on image 13.  Normally opacified pulmonary arteries with no pulmonary arterial filling defects seen. No significant change in mild diffuse peribronchial thickening. Mildly increased linear density at both lung bases. No lung nodules or enlarged lymph nodes. A small amount of irregular pleural/subpleural density in the posterior medial aspect of the right upper lobe has not changed significantly on image number 17, measuring 6 mm in maximum diameter.  The included portion of the upper abdomen is unremarkable. Moderate thoracic scoliosis and degenerative changes are again demonstrated. An old 40% mid thoracic vertebral compression deformity is stable with no acute fracture lines.  Review of the MIP images confirms the above findings.  IMPRESSION: 1. Mild interval increase in size of the previously demonstrated large infiltrating mass in the right upper chest and shoulder area. 2. No pulmonary emboli. 3. Mild bilateral linear atelectasis. 4. Stable right upper lobe probable pleural and subpleural scarring.   Electronically Signed   By: Enrique Sack M.D.   On: 01/10/2014 14:56   Dg Chest Port 1  View  01/10/2014   CLINICAL DATA:  Right side chest pain with deep inspiration. Shortness of breath.  EXAM: PORTABLE CHEST - 1 VIEW  COMPARISON:  CT chest 12/23/2013.  PA and lateral chest 08/18/2013.  FINDINGS: The patient is status post left mastectomy and bilateral axillary dissection. Small amount of fluid or thickening in  the minor fissure is noted. The lungs are otherwise clear. Heart size is normal. Soft tissue fullness in the right axilla correlates with soft tissue mass seen on comparison CT scan. Marked thoracolumbar scoliosis is noted. The patient is status post vertebral augmentation in the lumbar spine.  IMPRESSION: No acute cardiopulmonary disease.  Marked soft tissue fullness in the right axilla correlates with mass seen on CT scan.   Electronically Signed   By: Inge Rise M.D.   On: 01/10/2014 12:41     ASSESSMENT & PLAN:  Soft tissue sarcoma of chest wall She tolerates Pazopanib well so far without major side effects upon from very mild sensation of mucositis. I recommend we continued the same dose without adjustment. I will see her back again in 4 weeks for further supportive care and review of toxicity. Her most recent CT scan shows stable disease. I plan to repeat that in 3 months.  Chest wall pain I recommend she titrates pain medication as needed to get her pain under control. She is getting medication refilled through her primary care provider.  Anemia in neoplastic disease This is likely anemia of chronic disease. The patient denies recent history of bleeding such as epistaxis, hematuria or hematochezia. She is asymptomatic from the anemia. We will observe for now.      Mucositis due to antineoplastic therapy I recommend a trial of Magic mouthwash.    Orders Placed This Encounter  Procedures  . TSH    Standing Status: Future     Number of Occurrences:      Standing Expiration Date: 02/15/2015   All questions were answered. The patient knows to call the  clinic with any problems, questions or concerns. No barriers to learning was detected. I spent 25 minutes counseling the patient face to face. The total time spent in the appointment was 30 minutes and more than 50% was on counseling and review of test results     [facility], [place], MD 01/11/2014 8:18 PM

## 2014-01-11 NOTE — Telephone Encounter (Signed)
Faxed Rx for Votrient to [facility].

## 2014-01-11 NOTE — Progress Notes (Addendum)
Patient was seen in ED yesterday for upper right chest pain she described as sharp and SOB. She took Percocet at home, and states she had "some relief". She states the pain is mild today. CT Angio chest, chest x ray, EKG done.She states "all my tests were okay". She denies other pain, problems today.

## 2014-02-10 ENCOUNTER — Telehealth: Payer: Self-pay | Admitting: Hematology and Oncology

## 2014-02-10 ENCOUNTER — Ambulatory Visit (HOSPITAL_BASED_OUTPATIENT_CLINIC_OR_DEPARTMENT_OTHER): Payer: Medicare PPO | Admitting: Hematology and Oncology

## 2014-02-10 ENCOUNTER — Encounter: Payer: Self-pay | Admitting: Hematology and Oncology

## 2014-02-10 ENCOUNTER — Other Ambulatory Visit (HOSPITAL_BASED_OUTPATIENT_CLINIC_OR_DEPARTMENT_OTHER): Payer: Medicare PPO

## 2014-02-10 VITALS — BP 140/66 | HR 71 | Temp 98.4°F | Resp 18 | Ht 61.0 in | Wt 109.1 lb

## 2014-02-10 DIAGNOSIS — D630 Anemia in neoplastic disease: Secondary | ICD-10-CM

## 2014-02-10 DIAGNOSIS — C493 Malignant neoplasm of connective and soft tissue of thorax: Secondary | ICD-10-CM

## 2014-02-10 DIAGNOSIS — R5383 Other fatigue: Secondary | ICD-10-CM

## 2014-02-10 DIAGNOSIS — R0789 Other chest pain: Secondary | ICD-10-CM

## 2014-02-10 LAB — CBC WITH DIFFERENTIAL/PLATELET
BASO%: 0.4 % (ref 0.0–2.0)
BASOS ABS: 0 10*3/uL (ref 0.0–0.1)
EOS%: 11 % — AB (ref 0.0–7.0)
Eosinophils Absolute: 0.8 10*3/uL — ABNORMAL HIGH (ref 0.0–0.5)
HCT: 34.7 % — ABNORMAL LOW (ref 34.8–46.6)
HGB: 11.1 g/dL — ABNORMAL LOW (ref 11.6–15.9)
LYMPH%: 14.7 % (ref 14.0–49.7)
MCH: 31.4 pg (ref 25.1–34.0)
MCHC: 32 g/dL (ref 31.5–36.0)
MCV: 98.3 fL (ref 79.5–101.0)
MONO#: 0.7 10*3/uL (ref 0.1–0.9)
MONO%: 10.2 % (ref 0.0–14.0)
NEUT#: 4.4 10*3/uL (ref 1.5–6.5)
NEUT%: 63.7 % (ref 38.4–76.8)
PLATELETS: 206 10*3/uL (ref 145–400)
RBC: 3.53 10*6/uL — ABNORMAL LOW (ref 3.70–5.45)
RDW: 16.1 % — AB (ref 11.2–14.5)
WBC: 6.9 10*3/uL (ref 3.9–10.3)
lymph#: 1 10*3/uL (ref 0.9–3.3)

## 2014-02-10 LAB — COMPREHENSIVE METABOLIC PANEL (CC13)
ALBUMIN: 3.2 g/dL — AB (ref 3.5–5.0)
ALK PHOS: 80 U/L (ref 40–150)
ALT: 13 U/L (ref 0–55)
AST: 17 U/L (ref 5–34)
Anion Gap: 9 mEq/L (ref 3–11)
BUN: 19.7 mg/dL (ref 7.0–26.0)
CALCIUM: 9.1 mg/dL (ref 8.4–10.4)
CHLORIDE: 106 meq/L (ref 98–109)
CO2: 26 mEq/L (ref 22–29)
CREATININE: 0.8 mg/dL (ref 0.6–1.1)
Glucose: 110 mg/dl (ref 70–140)
POTASSIUM: 3.9 meq/L (ref 3.5–5.1)
Sodium: 140 mEq/L (ref 136–145)
Total Bilirubin: 0.38 mg/dL (ref 0.20–1.20)
Total Protein: 6.4 g/dL (ref 6.4–8.3)

## 2014-02-10 NOTE — Telephone Encounter (Signed)
gv and printed appt sched and avs for pt for Dec °

## 2014-02-10 NOTE — Assessment & Plan Note (Signed)
I recommend she titrates pain medication as needed to get her pain under control. She is getting medication refilled through her primary care provider.

## 2014-02-10 NOTE — Assessment & Plan Note (Signed)
This is likely anemia of chronic disease. The patient denies recent history of bleeding such as epistaxis, hematuria or hematochezia. She is asymptomatic from the anemia. We will observe for now.  

## 2014-02-10 NOTE — Progress Notes (Signed)
[place] OFFICE PROGRESS NOTE  Patient Care Team: Mayra Neer, MD as PCP - General (Family Medicine)  SUMMARY OF ONCOLOGIC HISTORY: This patient had background history of left breast cancer status post mastectomy. 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 Pathologic stage pT2 cN0 M0. She had another recurrence outside of the previous field of radiation; and had resection on February 16, 2010 with same pathology but positive margin. She is also s/p adjuvant XRT for each time she had resection. She was started on 06/26/10 with adjuvant chemotherapy with continuous infusion of Adriamycin/Cytoxan over 7 days q4 wks. There was plan for at least 5 cycles. However, she developed recurrent mucositis, worsening performance status, and unrelated problem of severe back pain from degenerative joint disease. Therefore, chemo was discontinued after two cycles. She had been on watchful observation until she developed local recurrent disease in August 2012 10 2014. She underwent repeat resection on December 27, 2010. She was treated with repeat radiation treatment in July 2014. Since discontinuation of treatment, she has continuous growth of her chest wall mass causing severe chest wall pain. On 12/14/2013, she was started on Pazopanib. On 01/10/2014, CT scan of the chest shows stable disease.  INTERVAL HISTORY: Please see below for problem oriented charting. She denies mucositis. She complained of poor energy and poor appetite but has not lost weight. She felt that the mass might be slightly larger. Her pain appears to be under control with current pain regimen.  REVIEW OF SYSTEMS:   Constitutional: Denies fevers, chills or abnormal weight loss Eyes: Denies blurriness of vision Ears, nose, mouth, throat, and face:  Denies mucositis or sore throat Respiratory: Denies cough, dyspnea or wheezes Cardiovascular: Denies palpitation, chest discomfort or lower extremity swelling Gastrointestinal:  Denies nausea, heartburn or change in bowel habits Skin: Denies abnormal skin rashes Lymphatics: Denies new lymphadenopathy or easy bruising Neurological:Denies numbness, tingling or new weaknesses Behavioral/Psych: Mood is stable, no new changes  All other systems were reviewed with the patient and are negative.  I have reviewed the past medical history, past surgical history, social history and family history with the patient and they are unchanged from previous note.  ALLERGIES:  is allergic to other; amoxicillin; chocolate; demerol; peanut-containing drug products; adhesive; ampicillin; meperidine and related; morphine and related; nitrofurantoin; sulfa antibiotics; and tetracyclines & related.  MEDICATIONS:  Current Outpatient Prescriptions  Medication Sig Dispense Refill  . alendronate (FOSAMAX) 70 MG tablet Take 70 mg by mouth every 7 (seven) days. Take with a full glass of water on an empty stomach.    Marland Kitchen amiodarone (PACERONE) 200 MG tablet     . aspirin 325 MG tablet Take 325 mg by mouth every evening.     . Calcium Carbonate-Vitamin D (CALTRATE 600+D PO) Take 1 tablet by mouth 2 (two) times daily.     Marland Kitchen docusate sodium (COLACE) 100 MG capsule Take 300 mg by mouth every evening.     . lidocaine (LIDODERM) 5 % Place 1 patch onto the skin daily.     . Multiple Vitamins-Iron (MULTIVITAMINS WITH IRON) TABS Take 1 tablet by mouth every morning.     . nitroGLYCERIN (NITROSTAT) 0.4 MG SL tablet Place 1 tablet (0.4 mg total) under the tongue every 5 (five) minutes as needed. For Chest pain 25 tablet 5  . omeprazole (PRILOSEC) 20 MG  capsule Take 20 mg by mouth every morning.     . ondansetron (ZOFRAN) 4 MG tablet Take 1 tablet (4 mg total) by mouth every 8 (eight) hours as needed for nausea. 20 tablet 1  .  oxyCODONE-acetaminophen (PERCOCET) 7.5-325 MG per tablet Take 1 tablet by mouth every 6 (six) hours as needed for pain. 40 tablet 0  . OXYCONTIN 10 MG T12A 10 mg at bedtime.     . pazopanib (VOTRIENT) 200 MG tablet Take 2 tablets (400 mg total) by mouth daily. Take on an empty stomach. 60 tablet 9  . RESTASIS 0.05 % ophthalmic emulsion Place 1 drop into both eyes daily.     . sertraline (ZOLOFT) 50 MG tablet Take 50 mg by mouth every evening.      No current facility-administered medications for this visit.    PHYSICAL EXAMINATION: ECOG PERFORMANCE STATUS: 1 - Symptomatic but completely ambulatory  Filed Vitals:   02/10/14 1516  BP: 140/66  Pulse: 71  Temp: 98.4 F (36.9 C)  Resp: 18   Filed Weights   02/10/14 1516  Weight: 109 lb 1.6 oz (49.487 kg)    GENERAL:alert, no distress and comfortable SKIN: skin color, texture, turgor are normal, no rashes or significant lesions EYES: normal, Conjunctiva are pink and non-injected, sclera clear OROPHARYNX:no exudate, no erythema and lips, buccal mucosa, and tongue normal  NECK: supple, thyroid normal size, non-tender, without nodularity LYMPH:  She has palpable axillary lymphadenopathy on the right side. LUNGS: clear to auscultation and percussion with normal breathing effort HEART: regular rate & rhythm and no murmurs and no lower extremity edema ABDOMEN:abdomen soft, non-tender and normal bowel sounds Musculoskeletal:no cyanosis of digits and no clubbing . She have limited mobility of her right arm due to persistent mass. There is a large mass over the right scapular region. NEURO: alert & oriented x 3 with fluent speech, no focal motor/sensory deficits  LABORATORY DATA:  I have reviewed the data as listed    Component Value Date/Time   NA 140 02/10/2014 1504   NA 137 01/10/2014 1215   K 3.9 02/10/2014 1504   K 4.1 01/10/2014 1215   CL 99 01/10/2014 1215   CL 103 09/28/2012 1517   CO2 26 02/10/2014 1504   CO2 23 01/10/2014  1215   GLUCOSE 110 02/10/2014 1504   GLUCOSE 122* 01/10/2014 1215   GLUCOSE 79 09/28/2012 1517   BUN 19.7 02/10/2014 1504   BUN 19 01/10/2014 1215   CREATININE 0.8 02/10/2014 1504   CREATININE 0.75 01/10/2014 1215   CALCIUM 9.1 02/10/2014 1504   CALCIUM 8.6 01/10/2014 1215   PROT 6.4 02/10/2014 1504   PROT 6.3 11/29/2011 1036   ALBUMIN 3.2* 02/10/2014 1504   ALBUMIN 3.8 11/29/2011 1036   AST 17 02/10/2014 1504   AST 18 11/29/2011 1036   ALT 13 02/10/2014 1504   ALT 13 11/29/2011 1036   ALKPHOS 80 02/10/2014 1504   ALKPHOS 80 11/29/2011 1036   BILITOT 0.38 02/10/2014 1504   BILITOT 0.3 11/29/2011 1036   GFRNONAA 72* 01/10/2014 1215   GFRAA 84* 01/10/2014 1215    No results found for: SPEP, UPEP  Lab Results  Component Value Date   WBC 6.9 02/10/2014   NEUTROABS 4.4 02/10/2014   HGB 11.1* 02/10/2014   HCT 34.7* 02/10/2014   MCV 98.3 02/10/2014   PLT 206 02/10/2014      Chemistry      Component Value Date/Time   NA 140 02/10/2014 1504   NA  137 01/10/2014 1215   K 3.9 02/10/2014 1504   K 4.1 01/10/2014 1215   CL 99 01/10/2014 1215   CL 103 09/28/2012 1517   CO2 26 02/10/2014 1504   CO2 23 01/10/2014 1215   BUN 19.7 02/10/2014 1504   BUN 19 01/10/2014 1215   CREATININE 0.8 02/10/2014 1504   CREATININE 0.75 01/10/2014 1215      Component Value Date/Time   CALCIUM 9.1 02/10/2014 1504   CALCIUM 8.6 01/10/2014 1215   ALKPHOS 80 02/10/2014 1504   ALKPHOS 80 11/29/2011 1036   AST 17 02/10/2014 1504   AST 18 11/29/2011 1036   ALT 13 02/10/2014 1504   ALT 13 11/29/2011 1036   BILITOT 0.38 02/10/2014 1504   BILITOT 0.3 11/29/2011 1036     ASSESSMENT & PLAN:  Soft tissue sarcoma of chest wall She tolerates Pazopanib well so far without major side effects upon from very mild sensation of mucositis. I recommend we continued the same dose without adjustment. I will see her back again in 5 weeks for further supportive care and review of toxicity. Her most recent  CT scan shows stable disease. I plan to repeat that next month to assess response to treatment. Clinically, it does not appear to have changed a whole lot but the patient is concerned that her pain is slightly worse.    Anemia in neoplastic disease This is likely anemia of chronic disease. The patient denies recent history of bleeding such as epistaxis, hematuria or hematochezia. She is asymptomatic from the anemia. We will observe for now.    Chest wall pain I recommend she titrates pain medication as needed to get her pain under control. She is getting medication refilled through her primary care provider.   Orders Placed This Encounter  Procedures  . CT Chest W Contrast    Standing Status: Future     Number of Occurrences:      Standing Expiration Date: 04/12/2015    Order Specific Question:  Reason for Exam (SYMPTOM  OR DIAGNOSIS REQUIRED)    Answer:  staging sarcoma assess response to chemo    Order Specific Question:  Preferred imaging location?    Answer:  [facility]   All questions were answered. The patient knows to call the clinic with any problems, questions or concerns. No barriers to learning was detected. I spent 25 minutes counseling the patient face to face. The total time spent in the appointment was 30 minutes and more than 50% was on counseling and review of test results     [facility], [place], MD 02/10/2014 4:11 PM

## 2014-02-10 NOTE — Telephone Encounter (Signed)
gv adn printed appt sched and avs for pt for Dec °

## 2014-02-10 NOTE — Assessment & Plan Note (Signed)
She tolerates Pazopanib well so far without major side effects upon from very mild sensation of mucositis. I recommend we continued the same dose without adjustment. I will see her back again in 5 weeks for further supportive care and review of toxicity. Her most recent CT scan shows stable disease. I plan to repeat that next month to assess response to treatment. Clinically, it does not appear to have changed a whole lot but the patient is concerned that her pain is slightly worse.

## 2014-02-11 LAB — TSH CHCC: TSH: 1.66 m(IU)/L (ref 0.308–3.960)

## 2014-03-01 ENCOUNTER — Telehealth: Payer: Self-pay | Admitting: *Deleted

## 2014-03-01 NOTE — Telephone Encounter (Signed)
I can manage her pain. Tell her to double oxycontin 20 mg BID PO and start oxycodone 15 mg PO prn/4 hours. I can refill pain prescription until I see her back if this is OK

## 2014-03-01 NOTE — Telephone Encounter (Signed)
S/w Dau who informs pt is currently taking Oxycontin 10 mg only at bedtime and then Percocet 7.5-325 mg every 6 hrs.  Instructed daughter pt can double her oxycontin to 20 mg at bedtime and instructed to take twice daily,  She can start w/ adding 10 mg in am and increase to 20 mg twice daily per Dr. Alvy Bimler.   Instructed pt can take percocet one every 4 hrs as needed for pain.  If this does not work then Dr. Alvy Bimler can write rx for oxycodone 15 mg tablets every 4 hrs.  Do not double the percocet as it would be too much tylenol.  Daughter verbalized undertanding will increase oxycontin to 20 mg at bedtime and 10 to 20 in the mornings.  Percocet q 4 hrs prn BTP.   She says she has  A lot of pain medication at home as she was given 3 month supply by PCP.  Instructed dau to call us tomorrow if she thinks pt will need a new Rx from Dr. Alvy Bimler or if these changes do not manage her pain.  Also instructed to not get any pain medication from any other MD.  She verbalized understanding.

## 2014-03-01 NOTE — Telephone Encounter (Signed)
VM left that pt needs increase in pain medication as  "pt is having a lot of issues with her pain right now."

## 2014-03-01 NOTE — Telephone Encounter (Signed)
S/w daughter who states pt's cancer pain not managed by oxycontin and oxycodone.  Says those meds were prescribed by PCP for her back pain but now "her cancer pain has taken over her back pain."  Rx currently being written by PCP.   Daughter asks if pt is supposed to call PCP for pain meds increase or Dr. Alvy Bimler since she feels it is the cancer/chest wall pain causing her the most discomfort at this time?

## 2014-03-11 ENCOUNTER — Other Ambulatory Visit (HOSPITAL_BASED_OUTPATIENT_CLINIC_OR_DEPARTMENT_OTHER): Payer: Medicare PPO

## 2014-03-11 ENCOUNTER — Ambulatory Visit (HOSPITAL_COMMUNITY)
Admission: RE | Admit: 2014-03-11 | Discharge: 2014-03-11 | Disposition: A | Discharge status: Home / Self Care | Payer: Medicare PPO | Source: Ambulatory Visit | Attending: Hematology and Oncology | Admitting: Hematology and Oncology

## 2014-03-11 DIAGNOSIS — C50919 Malignant neoplasm of unspecified site of unspecified female breast: Secondary | ICD-10-CM | POA: Diagnosis not present

## 2014-03-11 DIAGNOSIS — Z923 Personal history of irradiation: Secondary | ICD-10-CM | POA: Insufficient documentation

## 2014-03-11 DIAGNOSIS — C493 Malignant neoplasm of connective and soft tissue of thorax: Secondary | ICD-10-CM

## 2014-03-11 DIAGNOSIS — Z9221 Personal history of antineoplastic chemotherapy: Secondary | ICD-10-CM | POA: Diagnosis not present

## 2014-03-11 DIAGNOSIS — D630 Anemia in neoplastic disease: Secondary | ICD-10-CM

## 2014-03-11 LAB — COMPREHENSIVE METABOLIC PANEL (CC13)
ALT: 15 U/L (ref 0–55)
ANION GAP: 9 meq/L (ref 3–11)
AST: 19 U/L (ref 5–34)
Albumin: 3.1 g/dL — ABNORMAL LOW (ref 3.5–5.0)
Alkaline Phosphatase: 88 U/L (ref 40–150)
BILIRUBIN TOTAL: 0.37 mg/dL (ref 0.20–1.20)
BUN: 20 mg/dL (ref 7.0–26.0)
CO2: 29 meq/L (ref 22–29)
Calcium: 9.3 mg/dL (ref 8.4–10.4)
Chloride: 103 mEq/L (ref 98–109)
Creatinine: 0.8 mg/dL (ref 0.6–1.1)
EGFR: 64 mL/min/{1.73_m2} — AB (ref >90)
Glucose: 83 mg/dl (ref 70–140)
Potassium: 4.2 mEq/L (ref 3.5–5.1)
SODIUM: 141 meq/L (ref 136–145)
TOTAL PROTEIN: 6.4 g/dL (ref 6.4–8.3)

## 2014-03-11 LAB — CBC WITH DIFFERENTIAL/PLATELET
BASO%: 0.5 % (ref 0.0–2.0)
Basophils Absolute: 0 10*3/uL (ref 0.0–0.1)
EOS ABS: 0.8 10*3/uL — AB (ref 0.0–0.5)
EOS%: 14.3 % — AB (ref 0.0–7.0)
HEMATOCRIT: 36.3 % (ref 34.8–46.6)
HGB: 11.4 g/dL — ABNORMAL LOW (ref 11.6–15.9)
LYMPH#: 1.1 10*3/uL (ref 0.9–3.3)
LYMPH%: 18.9 % (ref 14.0–49.7)
MCH: 31.6 pg (ref 25.1–34.0)
MCHC: 31.4 g/dL — ABNORMAL LOW (ref 31.5–36.0)
MCV: 100.6 fL (ref 79.5–101.0)
MONO#: 0.4 10*3/uL (ref 0.1–0.9)
MONO%: 7.5 % (ref 0.0–14.0)
NEUT%: 58.8 % (ref 38.4–76.8)
NEUTROS ABS: 3.3 10*3/uL (ref 1.5–6.5)
Platelets: 218 10*3/uL (ref 145–400)
RBC: 3.61 10*6/uL — ABNORMAL LOW (ref 3.70–5.45)
RDW: 15.9 % — AB (ref 11.2–14.5)
WBC: 5.6 10*3/uL (ref 3.9–10.3)
nRBC: 0 % (ref 0–0)

## 2014-03-11 MED ORDER — IOHEXOL 300 MG/ML  SOLN
80.0000 mL | Freq: Once | INTRAMUSCULAR | Status: AC | PRN
Start: 2014-03-11 — End: 2014-03-11
  Administered 2014-03-11: 80 mL via INTRAVENOUS

## 2014-03-14 ENCOUNTER — Ambulatory Visit (HOSPITAL_BASED_OUTPATIENT_CLINIC_OR_DEPARTMENT_OTHER): Payer: Medicare PPO | Admitting: Hematology and Oncology

## 2014-03-14 VITALS — BP 127/56 | HR 67 | Temp 97.4°F | Resp 18 | Ht 61.0 in | Wt 107.4 lb

## 2014-03-14 DIAGNOSIS — C493 Malignant neoplasm of connective and soft tissue of thorax: Secondary | ICD-10-CM

## 2014-03-14 DIAGNOSIS — R0789 Other chest pain: Secondary | ICD-10-CM

## 2014-03-14 MED ORDER — OXYCODONE HCL ER 10 MG PO T12A: EXTENDED_RELEASE_TABLET | ORAL | Status: AC

## 2014-03-14 NOTE — Progress Notes (Signed)
[facility] OFFICE PROGRESS NOTE  Patient Care Team: Mayra Neer, MD as PCP - General (Family Medicine)  SUMMARY OF ONCOLOGIC HISTORY:  This patient had background history of left breast cancer status post mastectomy. 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 Pathologic stage pT2 cN0 M0. She had another recurrence outside of the previous field of radiation; and had resection on February 16, 2010 with same pathology but positive margin. She is also s/p adjuvant XRT for each time she had resection. She was started on 06/26/10 with adjuvant chemotherapy with continuous infusion of Adriamycin/Cytoxan over 7 days q4 wks. There was plan for at least 5 cycles. However, she developed recurrent mucositis, worsening performance status, and unrelated problem of severe back pain from degenerative joint disease. Therefore, chemo was discontinued after two cycles. She had been on watchful observation until she developed local recurrent disease in August 2012 10 2014. She underwent repeat resection on December 27, 2010. She was treated with repeat radiation treatment in July 2014. Since discontinuation of treatment, she has continuous growth of her chest wall mass causing severe chest wall pain. On 12/14/2013, she was started on Pazopanib. On 01/10/2014, CT scan of the chest shows stable disease. In 03/11/2014, repeat CT scan show progression of disease INTERVAL HISTORY: Please see below for problem oriented charting. She complained of worsening right shoulder pain and arm pain radiating down to her hand. She also noted significant skin changes on her back. She complained of fatigue and sleepiness with current pain medication. Appetite is poor. REVIEW OF SYSTEMS:   Constitutional: Denies fevers, chills  Eyes: Denies  blurriness of vision Ears, nose, mouth, throat, and face: Denies mucositis or sore throat Respiratory: Denies cough, dyspnea or wheezes Cardiovascular: Denies palpitation, chest discomfort or lower extremity swelling Gastrointestinal:  Denies nausea, heartburn or change in bowel habits Skin: Denies abnormal skin rashes Lymphatics: Denies new lymphadenopathy or easy bruising Neurological:Denies numbness, tingling or new weaknesses Behavioral/Psych: Mood is stable, no new changes  All other systems were reviewed with the patient and are negative.  I have reviewed the past medical history, past surgical history, social history and family history with the patient and they are unchanged from previous note.  ALLERGIES:  is allergic to other; amoxicillin; chocolate; demerol; peanut-containing drug products; adhesive; ampicillin; meperidine and related; morphine and related; nitrofurantoin; sulfa antibiotics; and tetracyclines & related.  MEDICATIONS:  Current Outpatient Prescriptions  Medication Sig Dispense Refill  . alendronate (FOSAMAX) 70 MG tablet Take 70 mg by mouth every 7 (seven) days. Take with a full glass of water on an empty stomach.    Marland Kitchen amiodarone (PACERONE) 200 MG tablet     . aspirin 325 MG tablet Take 325 mg by mouth every evening.     . Calcium Carbonate-Vitamin D (CALTRATE 600+D PO) Take 1 tablet by mouth 2 (two) times daily.     Marland Kitchen docusate sodium (COLACE) 100 MG capsule Take 300 mg by mouth every evening.     . lidocaine (LIDODERM) 5 % Place 1 patch onto the skin daily.     . Multiple Vitamins-Iron (MULTIVITAMINS WITH IRON) TABS Take 1 tablet by mouth every morning.     . nitroGLYCERIN (NITROSTAT) 0.4 MG SL tablet Place 1 tablet (0.4 mg total) under the tongue every 5 (five) minutes as needed. For Chest pain 25 tablet 5  .  omeprazole (PRILOSEC) 20 MG capsule Take 20 mg by mouth every morning.     . ondansetron (ZOFRAN) 4 MG tablet Take 1 tablet (4 mg total) by mouth every 8  (eight) hours as needed for nausea. 20 tablet 1  . OxyCODONE (OXYCONTIN) 10 mg T12A 12 hr tablet Take 1 tab every 8 hours 90 tablet 0  . oxyCODONE-acetaminophen (PERCOCET) 7.5-325 MG per tablet Take 1 tablet by mouth every 6 (six) hours as needed for pain. 40 tablet 0  . pazopanib (VOTRIENT) 200 MG tablet Take 2 tablets (400 mg total) by mouth daily. Take on an empty stomach. 60 tablet 9  . RESTASIS 0.05 % ophthalmic emulsion Place 1 drop into both eyes daily.     . sertraline (ZOLOFT) 50 MG tablet Take 50 mg by mouth every evening.      No current facility-administered medications for this visit.    PHYSICAL EXAMINATION: ECOG PERFORMANCE STATUS: 2 - Symptomatic, <50% confined to bed  Filed Vitals:   03/14/14 1229  BP: 127/56  Pulse: 67  Temp: 97.4 F (36.3 C)  Resp: 18   Filed Weights   03/14/14 1229  Weight: 107 lb 6.4 oz (48.716 kg)    GENERAL:alert, no distress and comfortable SKIN: skin color, texture, turgor are normal, no rashes or significant lesions EYES: normal, Conjunctiva are pink and non-injected, sclera clear Musculoskeletal:no cyanosis of digits and no clubbing. Significant mass noted over her right scapula. NEURO: alert & oriented x 3 with fluent speech, no focal motor/sensory deficits  LABORATORY DATA:  I have reviewed the data as listed    Component Value Date/Time   NA 141 03/11/2014 1412   NA 137 01/10/2014 1215   K 4.2 03/11/2014 1412   K 4.1 01/10/2014 1215   CL 99 01/10/2014 1215   CL 103 09/28/2012 1517   CO2 29 03/11/2014 1412   CO2 23 01/10/2014 1215   GLUCOSE 83 03/11/2014 1412   GLUCOSE 122* 01/10/2014 1215   GLUCOSE 79 09/28/2012 1517   BUN 20.0 03/11/2014 1412   BUN 19 01/10/2014 1215   CREATININE 0.8 03/11/2014 1412   CREATININE 0.75 01/10/2014 1215   CALCIUM 9.3 03/11/2014 1412   CALCIUM 8.6 01/10/2014 1215   PROT 6.4 03/11/2014 1412   PROT 6.3 11/29/2011 1036   ALBUMIN 3.1* 03/11/2014 1412   ALBUMIN 3.8 11/29/2011 1036   AST 19  03/11/2014 1412   AST 18 11/29/2011 1036   ALT 15 03/11/2014 1412   ALT 13 11/29/2011 1036   ALKPHOS 88 03/11/2014 1412   ALKPHOS 80 11/29/2011 1036   BILITOT 0.37 03/11/2014 1412   BILITOT 0.3 11/29/2011 1036   GFRNONAA 72* 01/10/2014 1215   GFRAA 84* 01/10/2014 1215    No results found for: SPEP, UPEP  Lab Results  Component Value Date   WBC 5.6 03/11/2014   NEUTROABS 3.3 03/11/2014   HGB 11.4* 03/11/2014   HCT 36.3 03/11/2014   MCV 100.6 03/11/2014   PLT 218 03/11/2014      Chemistry      Component Value Date/Time   NA 141 03/11/2014 1412   NA 137 01/10/2014 1215   K 4.2 03/11/2014 1412   K 4.1 01/10/2014 1215   CL 99 01/10/2014 1215   CL 103 09/28/2012 1517   CO2 29 03/11/2014 1412   CO2 23 01/10/2014 1215   BUN 20.0 03/11/2014 1412   BUN 19 01/10/2014 1215   CREATININE 0.8 03/11/2014 1412   CREATININE 0.75 01/10/2014 1215  Component Value Date/Time   CALCIUM 9.3 03/11/2014 1412   CALCIUM 8.6 01/10/2014 1215   ALKPHOS 88 03/11/2014 1412   ALKPHOS 80 11/29/2011 1036   AST 19 03/11/2014 1412   AST 18 11/29/2011 1036   ALT 15 03/11/2014 1412   ALT 13 11/29/2011 1036   BILITOT 0.37 03/11/2014 1412   BILITOT 0.3 11/29/2011 1036       RADIOGRAPHIC STUDIES: Reviewed her imaging study I have personally reviewed the radiological images as listed and agreed with the findings in the report.  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 The treatment options are limited. I recommend referring her back to [facility] for second opinion. I recommended also consideration for hospice care.  Chest wall pain Her pain is poorly controlled despite the addition of OxyContin extended release. I recommend increasing OxyContin to 3 times a day and to continue using Percocet as breakthrough pain medicine. I also recommend consideration for hospice care.   No orders of the  defined types were placed in this encounter.   All questions were answered. The patient knows to call the clinic with any problems, questions or concerns. No barriers to learning was detected. I spent 30 minutes counseling the patient face to face. The total time spent in the appointment was 40 minutes and more than 50% was on counseling and review of test results     [facility], [place], MD 03/14/2014 7:47 PM

## 2014-03-14 NOTE — Assessment & Plan Note (Signed)
Her pain is poorly controlled despite the addition of OxyContin extended release. I recommend increasing OxyContin to 3 times a day and to continue using Percocet as breakthrough pain medicine. I also recommend consideration for hospice care.

## 2014-03-14 NOTE — Assessment & Plan Note (Signed)
Fortunately, imaging study showed disease progression. The treatment options are limited. I recommend referring her back to [facility] for second opinion. I recommended also consideration for hospice care.

## 2014-03-15 ENCOUNTER — Telehealth: Payer: Self-pay | Admitting: Hematology and Oncology

## 2014-03-15 NOTE — Telephone Encounter (Signed)
Pt appt. With Dr. Baltazar Najjar @ [facility] is 03/23/14@10 :20. Medical records faxed. Pt is aware

## 2014-03-21 ENCOUNTER — Telehealth: Payer: Self-pay | Admitting: *Deleted

## 2014-03-21 NOTE — Telephone Encounter (Signed)
Only if they find the opinion is helpful. If not, hospice referral should suffice

## 2014-03-21 NOTE — Telephone Encounter (Signed)
S/w daughter about Hospice referral and appt at [facility] will not pay for treatment (chemo) and Hospice.  She will need to decide if she wants Hospice or treatment.  It may not be worth it to keep appt at [facility] if pt knows she does not want to pursue any further chemotherapy.  Although [facility] may not recommend anymore treatment.  Daughter verbalized understanding and requests Hospice referral.  She will talk to pt about the appt at [facility] and they will call to cancel it if pt does not want it.   Called referral to [place],  S/w [place].  They will contact daughter today to arrange appointment.

## 2014-03-21 NOTE — Telephone Encounter (Signed)
Daughter left VM they want to get Hospice Referral now.  Pt has appt at [facility] this Wednesday and they are not sure if they need to keep appt?

## 2014-03-22 ENCOUNTER — Telehealth: Payer: Self-pay | Admitting: *Deleted

## 2014-03-22 NOTE — Telephone Encounter (Signed)
Hospice called for diagnosis code.  C49.3 soft tissue carcinoma of chest wall given at this time of call.

## 2014-04-11 ENCOUNTER — Telehealth: Payer: Self-pay | Admitting: *Deleted

## 2014-04-11 NOTE — Telephone Encounter (Signed)
Message from Hospice RN, Mariel Kansky.  She reports pt condition is declining and her daughter has requested DNR form for home.  DNR form signed by Hospice MD delivered to home.

## 2014-05-09 DEATH — deceased

## 2014-05-18 ENCOUNTER — Ambulatory Visit: Payer: Medicare PPO | Admitting: Interventional Cardiology
